# Patient Record
Sex: Female | Born: 1975 | Race: White | Hispanic: No | Smoking: Former smoker
Health system: Southern US, Community
[De-identification: ages and names within clinical notes are randomized; demographics above are authoritative.]

## PROBLEM LIST (undated history)

## (undated) DIAGNOSIS — E559 Vitamin D deficiency, unspecified: Secondary | ICD-10-CM

## (undated) DIAGNOSIS — T4145XA Adverse effect of unspecified anesthetic, initial encounter: Secondary | ICD-10-CM

## (undated) DIAGNOSIS — G501 Atypical facial pain: Secondary | ICD-10-CM

## (undated) DIAGNOSIS — IMO0002 Reserved for concepts with insufficient information to code with codable children: Secondary | ICD-10-CM

## (undated) DIAGNOSIS — G43009 Migraine without aura, not intractable, without status migrainosus: Secondary | ICD-10-CM

## (undated) DIAGNOSIS — Z720 Tobacco use: Secondary | ICD-10-CM

## (undated) DIAGNOSIS — J039 Acute tonsillitis, unspecified: Secondary | ICD-10-CM

## (undated) DIAGNOSIS — F32A Depression, unspecified: Secondary | ICD-10-CM

## (undated) DIAGNOSIS — M329 Systemic lupus erythematosus, unspecified: Secondary | ICD-10-CM

## (undated) DIAGNOSIS — F331 Major depressive disorder, recurrent, moderate: Secondary | ICD-10-CM

## (undated) DIAGNOSIS — Z8719 Personal history of other diseases of the digestive system: Secondary | ICD-10-CM

## (undated) DIAGNOSIS — G44209 Tension-type headache, unspecified, not intractable: Secondary | ICD-10-CM

## (undated) DIAGNOSIS — M5481 Occipital neuralgia: Secondary | ICD-10-CM

## (undated) DIAGNOSIS — B029 Zoster without complications: Secondary | ICD-10-CM

## (undated) DIAGNOSIS — M0520 Rheumatoid vasculitis with rheumatoid arthritis of unspecified site: Secondary | ICD-10-CM

## (undated) DIAGNOSIS — Z8489 Family history of other specified conditions: Secondary | ICD-10-CM

## (undated) DIAGNOSIS — K219 Gastro-esophageal reflux disease without esophagitis: Secondary | ICD-10-CM

## (undated) DIAGNOSIS — S92909A Unspecified fracture of unspecified foot, initial encounter for closed fracture: Secondary | ICD-10-CM

## (undated) DIAGNOSIS — F329 Major depressive disorder, single episode, unspecified: Secondary | ICD-10-CM

## (undated) DIAGNOSIS — T8859XA Other complications of anesthesia, initial encounter: Secondary | ICD-10-CM

## (undated) DIAGNOSIS — F419 Anxiety disorder, unspecified: Secondary | ICD-10-CM

## (undated) DIAGNOSIS — B977 Papillomavirus as the cause of diseases classified elsewhere: Secondary | ICD-10-CM

## (undated) DIAGNOSIS — J329 Chronic sinusitis, unspecified: Secondary | ICD-10-CM

## (undated) DIAGNOSIS — R87619 Unspecified abnormal cytological findings in specimens from cervix uteri: Secondary | ICD-10-CM

## (undated) HISTORY — DX: Depression, unspecified: F32.A

## (undated) HISTORY — PX: CHOLECYSTECTOMY: SHX55

## (undated) HISTORY — DX: Papillomavirus as the cause of diseases classified elsewhere: B97.7

## (undated) HISTORY — PX: TUBAL LIGATION: SHX77

## (undated) HISTORY — PX: TONSILLECTOMY: SUR1361

## (undated) HISTORY — DX: Vitamin D deficiency, unspecified: E55.9

## (undated) HISTORY — DX: Unspecified abnormal cytological findings in specimens from cervix uteri: R87.619

## (undated) HISTORY — DX: Major depressive disorder, recurrent, moderate: F33.1

## (undated) HISTORY — PX: WISDOM TOOTH EXTRACTION: SHX21

## (undated) HISTORY — DX: Migraine without aura, not intractable, without status migrainosus: G43.009

## (undated) HISTORY — DX: Gastro-esophageal reflux disease without esophagitis: K21.9

## (undated) HISTORY — DX: Tobacco use: Z72.0

## (undated) HISTORY — DX: Rheumatoid vasculitis with rheumatoid arthritis of unspecified site: M05.20

## (undated) HISTORY — DX: Major depressive disorder, single episode, unspecified: F32.9

## (undated) SURGERY — Surgical Case
Anesthesia: *Unknown

---
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
## 2005-01-18 ENCOUNTER — Emergency Department: Payer: Self-pay | Admitting: Emergency Medicine

## 2007-08-18 ENCOUNTER — Emergency Department: Payer: Self-pay | Admitting: Emergency Medicine

## 2008-03-05 ENCOUNTER — Other Ambulatory Visit: Payer: Self-pay

## 2008-03-05 ENCOUNTER — Emergency Department: Payer: Self-pay | Admitting: Emergency Medicine

## 2010-01-02 ENCOUNTER — Emergency Department: Payer: Self-pay | Admitting: Emergency Medicine

## 2010-07-24 ENCOUNTER — Emergency Department: Payer: Self-pay | Admitting: Emergency Medicine

## 2012-02-02 ENCOUNTER — Ambulatory Visit: Payer: Self-pay | Admitting: Otolaryngology

## 2013-08-19 DIAGNOSIS — R87619 Unspecified abnormal cytological findings in specimens from cervix uteri: Secondary | ICD-10-CM

## 2013-08-19 HISTORY — DX: Unspecified abnormal cytological findings in specimens from cervix uteri: R87.619

## 2013-09-01 LAB — HM PAP SMEAR

## 2013-10-05 HISTORY — PX: LEEP: SHX91

## 2014-08-29 ENCOUNTER — Ambulatory Visit: Payer: Self-pay | Admitting: Neurology

## 2014-11-19 ENCOUNTER — Emergency Department: Payer: Self-pay | Admitting: Emergency Medicine

## 2014-11-19 LAB — CBC WITH DIFFERENTIAL/PLATELET
BASOS PCT: 1.2 %
Basophil #: 0.1 10*3/uL (ref 0.0–0.1)
EOS ABS: 0.2 10*3/uL (ref 0.0–0.7)
Eosinophil %: 1.7 %
HCT: 40.5 % (ref 35.0–47.0)
HGB: 13.5 g/dL (ref 12.0–16.0)
LYMPHS PCT: 27 %
Lymphocyte #: 2.6 10*3/uL (ref 1.0–3.6)
MCH: 31.2 pg (ref 26.0–34.0)
MCHC: 33.2 g/dL (ref 32.0–36.0)
MCV: 94 fL (ref 80–100)
Monocyte #: 0.6 x10 3/mm (ref 0.2–0.9)
Monocyte %: 6.1 %
Neutrophil #: 6.1 10*3/uL (ref 1.4–6.5)
Neutrophil %: 64 %
Platelet: 347 10*3/uL (ref 150–440)
RBC: 4.32 10*6/uL (ref 3.80–5.20)
RDW: 12.7 % (ref 11.5–14.5)
WBC: 9.5 10*3/uL (ref 3.6–11.0)

## 2014-11-19 LAB — COMPREHENSIVE METABOLIC PANEL
ANION GAP: 7 (ref 7–16)
Albumin: 4 g/dL (ref 3.4–5.0)
Alkaline Phosphatase: 67 U/L (ref 46–116)
BILIRUBIN TOTAL: 0.3 mg/dL (ref 0.2–1.0)
BUN: 9 mg/dL (ref 7–18)
CHLORIDE: 105 mmol/L (ref 98–107)
CO2: 27 mmol/L (ref 21–32)
CREATININE: 0.7 mg/dL (ref 0.60–1.30)
Calcium, Total: 9.1 mg/dL (ref 8.5–10.1)
EGFR (Non-African Amer.): >60
Glucose: 83 mg/dL (ref 65–99)
OSMOLALITY: 275 (ref 275–301)
POTASSIUM: 3.7 mmol/L (ref 3.5–5.1)
SGOT(AST): 25 U/L (ref 15–37)
SGPT (ALT): 20 U/L (ref 14–63)
SODIUM: 139 mmol/L (ref 136–145)
TOTAL PROTEIN: 7.6 g/dL (ref 6.4–8.2)

## 2014-11-19 LAB — URINALYSIS, COMPLETE
Bilirubin,UR: NEGATIVE
GLUCOSE, UR: NEGATIVE mg/dL (ref 0–75)
Ketone: NEGATIVE
Nitrite: NEGATIVE
Ph: 6 (ref 4.5–8.0)
Protein: NEGATIVE
Specific Gravity: 1.008 (ref 1.003–1.030)
Squamous Epithelial: 5
WBC UR: 3 /HPF (ref 0–5)

## 2014-11-19 LAB — LIPASE, BLOOD: Lipase: 112 U/L (ref 73–393)

## 2014-11-19 LAB — TROPONIN I: Troponin-I: <0.02 ng/mL

## 2014-11-21 ENCOUNTER — Ambulatory Visit: Payer: Self-pay | Admitting: Surgery

## 2014-11-22 ENCOUNTER — Ambulatory Visit: Payer: Self-pay | Admitting: Surgery

## 2014-11-30 ENCOUNTER — Ambulatory Visit: Payer: Self-pay | Admitting: Surgery

## 2014-12-06 ENCOUNTER — Ambulatory Visit: Payer: Self-pay | Admitting: Surgery

## 2014-12-07 ENCOUNTER — Ambulatory Visit: Payer: Self-pay | Admitting: Surgery

## 2014-12-27 ENCOUNTER — Ambulatory Visit: Payer: Self-pay | Admitting: Gastroenterology

## 2014-12-28 ENCOUNTER — Ambulatory Visit: Payer: Self-pay | Admitting: Gastroenterology

## 2015-01-19 ENCOUNTER — Ambulatory Visit
Admit: 2015-01-19 | Disposition: A | Discharge status: Home / Self Care | Payer: Self-pay | Attending: Family Medicine | Admitting: Family Medicine

## 2015-02-11 LAB — SURGICAL PATHOLOGY

## 2015-02-17 NOTE — H&P (Signed)
PATIENT NAME:  Erika Williamson, Erika Williamson MR#:  [id number] DATE OF BIRTH:  [birth year]  DATE OF ADMISSION:  11/21/2014  DATE OF OPERATION: 11/22/2014   CHIEF COMPLAINT: Right upper quadrant pain.   HISTORY OF PRESENT ILLNESS: This is a patient seen in the office on February 3rd with approximately 2-1/2 weeks of abdominal pain that  has been in the epigastrium and right upper quadrant. It was initially associated with fatty food intolerance, but has been nearly unrelenting. She has been unable to eat. She was in the Emergency Room the other day, where her liver function tests were normal and an ultrasound shows stones. She is here for elective laparoscopic cholecystectomy, but she likely has a diagnosis of acute cholecystitis due to her unrelenting pain and physical exam findings.   PAST MEDICAL HISTORY: None.   PAST SURGICAL HISTORY: C-section x 2 and wisdom teeth.   ALLERGIES: None.   MEDICATIONS: None.   FAMILY HISTORY: No history of gallbladder disease in her family.   SOCIAL HISTORY: The patient smokes tobacco, about 1 pack of cigarettes per day. Does not drink much alcohol. Works as a Freight forwarder at [employer].   REVIEW OF SYSTEMS: A complete system review is performed and documented in the office chart. It is normal or negative with the exception of that mentioned in the HPI.   PHYSICAL EXAMINATION: GENERAL: Uncomfortable -appearing female patient.  VITAL SIGNS: She is afebrile and vital signs are stable.  HEENT: No scleral icterus.  NECK: No palpable neck nodes.  CHEST: Clear to auscultation.  CARDIAC: Regular rate and rhythm.  ABDOMEN: Soft. There is considerable tenderness in the right upper quadrant with a positive Murphy sign. A C-section Pfannenstiel-type scar is noted. There may be a very small umbilical hernia, but it is nontender and nonerythematous.  EXTREMITIES: Without edema. Calves are nontender.  NEUROLOGIC: Grossly intact.  INTEGUMENT: No jaundice.   LABORATORY VALUES:  Done from the Emergency Room demonstrate no sign of choledocholithiasis. An ultrasound shows stones.   ASSESSMENT AND PLAN: This is a patient with biliary colic at the very least, but more than likely, she has clinical findings of acute cholecystitis. I have recommended urgent laparoscopic cholecystectomy for control of her symptoms. I offered her surgery on February 4th. She wanted to put it off for another week, but she is so uncomfortable, I urged her to have surgery sooner, as she has been unable to eat. The rationale for this has been discussed. The offer to do this at any time was discussed with her, and she has chosen to do it on February 4th. The risks of bleeding, infection, recurrence of symptoms, failure to resolve her symptoms, open procedure, bile duct damage, bile duct leak, bowel injury, any of which could require further surgery and/or ERCP, stent, and papillotomy were all discussed with her. Her son was present.    ____________________________ Jerrol Banana. Burt Knack, MD rec:mw D: 11/21/2014 14:11:00 ET T: 11/21/2014 14:52:28 ET JOB#: [id number]  cc: Jerrol Banana. Burt Knack, MD, <Dictator> Florene Glen MD ELECTRONICALLY SIGNED 11/22/2014 8:47

## 2015-02-17 NOTE — Op Note (Signed)
PATIENT NAME:  Erika Williamson, HAKEEM MR#:  [id number] DATE OF BIRTH:  06-Jul-1976  DATE OF PROCEDURE:  11/22/2014  PREOPERATIVE DIAGNOSIS: Acute cholecystitis.   POSTOPERATIVE DIAGNOSIS: Acute cholecystitis.   PROCEDURE: Laparoscopic cholecystectomy.   SURGEON: Phoebe Perch, MD.   ANESTHESIA: General with endotracheal tube.   INDICATIONS: This is a patient seen in the office yesterday with unrelenting right upper quadrant pain. She had been in the ER earlier this week. Preoperatively we discussed rationale for surgery, the options of observation, risks of bleeding, infection, recurrence of symptoms, failure to resolve her symptoms, open procedure, bile duct damage, bile duct leak, retained common bile duct stone, any of which could require further surgery and/or ERCP, stent, papillotomy. This was all reviewed for her in the preoperative holding area. She understood and agreed to proceed.   FINDINGS: Minimal signs of acute cholecystitis, some edema of the gallbladder, stones were present.   DESCRIPTION OF PROCEDURE: The patient was induced to general anesthesia. She was given IV antibiotics. VTE prophylaxis was in place. She was prepped and draped in a sterile fashion. Marcaine was infiltrated in skin and subcutaneous tissues around the supraumbilical area. Incision was made. Veress needle was placed. Pneumoperitoneum was obtained. A 5 mm trocar port was placed. The abdominal cavity was explored and under direct vision a 10 mm epigastric port and 2 lateral 5 mm ports were placed. The gallbladder was placed on tension. Adhesions were taken down bluntly without the use of energy. The peritoneum over the infundibulum was incised bluntly. The cystic duct-gallbladder junction was well identified as was the cystic artery. The cystic duct was then doubly clipped and divided and the cystic artery was doubly clipped and divided in 2 branches. The gallbladder was taken from the gallbladder fossa with  electrocautery and passed out through the epigastric port site with the aid of an EndoCatch bag. The area was checked for hemostasis, found to be adequate. There was no sign of bleeding, bile leak, or bowel injury. The camera was placed in the epigastric site to view back to the periumbilical site. There was no sign of bowel injury or adhesions. Therefore pneumoperitoneum was released. All ports were removed. Fascial edges at the epigastric site were approximated with figure-of-eight 0 Vicryls, 4-0 subcuticular Monocryl was used on all skin edges. Steri-Strips, Mastisol, and sterile dressings were placed.   The patient tolerated the procedure well. There were no complications. She was taken to the recovery room in stable condition to be discharged to the care of her family. Follow up in 10 days.    ____________________________ Jerrol Banana Burt Knack, MD rec:bu D: 11/22/2014 09:21:43 ET T: 11/22/2014 15:44:39 ET JOB#: [id number]  cc: Jerrol Banana. Burt Knack, MD, <Dictator> Florene Glen MD ELECTRONICALLY SIGNED 11/26/2014 23:33

## 2015-03-27 ENCOUNTER — Telehealth: Payer: Self-pay | Admitting: Family Medicine

## 2015-03-27 NOTE — Telephone Encounter (Signed)
Pt calling about appt Dr Sanda Klein wanted set up for positive celiac test.  Please call her.

## 2015-03-27 NOTE — Telephone Encounter (Signed)
Pt given appointment time to GI.

## 2015-05-02 ENCOUNTER — Encounter: Payer: Self-pay | Admitting: *Deleted

## 2015-05-03 ENCOUNTER — Encounter: Payer: Self-pay | Admitting: Anesthesiology

## 2015-05-03 ENCOUNTER — Ambulatory Visit: Payer: BLUE CROSS/BLUE SHIELD | Admitting: Anesthesiology

## 2015-05-03 ENCOUNTER — Encounter
Admission: RE | Disposition: A | Discharge status: Home / Self Care | Payer: Self-pay | Source: Ambulatory Visit | Attending: Gastroenterology

## 2015-05-03 ENCOUNTER — Ambulatory Visit
Admission: RE | Admit: 2015-05-03 | Discharge: 2015-05-03 | Disposition: A | Discharge status: Home / Self Care | Payer: BLUE CROSS/BLUE SHIELD | Source: Ambulatory Visit | Attending: Gastroenterology | Admitting: Gastroenterology

## 2015-05-03 DIAGNOSIS — Z79899 Other long term (current) drug therapy: Secondary | ICD-10-CM | POA: Insufficient documentation

## 2015-05-03 DIAGNOSIS — K3189 Other diseases of stomach and duodenum: Secondary | ICD-10-CM | POA: Diagnosis not present

## 2015-05-03 DIAGNOSIS — Z1211 Encounter for screening for malignant neoplasm of colon: Secondary | ICD-10-CM | POA: Diagnosis not present

## 2015-05-03 DIAGNOSIS — K640 First degree hemorrhoids: Secondary | ICD-10-CM | POA: Insufficient documentation

## 2015-05-03 DIAGNOSIS — F17210 Nicotine dependence, cigarettes, uncomplicated: Secondary | ICD-10-CM | POA: Insufficient documentation

## 2015-05-03 DIAGNOSIS — Z9049 Acquired absence of other specified parts of digestive tract: Secondary | ICD-10-CM | POA: Insufficient documentation

## 2015-05-03 DIAGNOSIS — Z800 Family history of malignant neoplasm of digestive organs: Secondary | ICD-10-CM | POA: Insufficient documentation

## 2015-05-03 DIAGNOSIS — K635 Polyp of colon: Secondary | ICD-10-CM | POA: Insufficient documentation

## 2015-05-03 DIAGNOSIS — Z808 Family history of malignant neoplasm of other organs or systems: Secondary | ICD-10-CM | POA: Diagnosis present

## 2015-05-03 DIAGNOSIS — F419 Anxiety disorder, unspecified: Secondary | ICD-10-CM | POA: Insufficient documentation

## 2015-05-03 DIAGNOSIS — J449 Chronic obstructive pulmonary disease, unspecified: Secondary | ICD-10-CM | POA: Insufficient documentation

## 2015-05-03 DIAGNOSIS — K5730 Diverticulosis of large intestine without perforation or abscess without bleeding: Secondary | ICD-10-CM | POA: Diagnosis not present

## 2015-05-03 DIAGNOSIS — Z9851 Tubal ligation status: Secondary | ICD-10-CM | POA: Insufficient documentation

## 2015-05-03 DIAGNOSIS — R894 Abnormal immunological findings in specimens from other organs, systems and tissues: Secondary | ICD-10-CM | POA: Diagnosis present

## 2015-05-03 HISTORY — DX: Acute tonsillitis, unspecified: J03.90

## 2015-05-03 HISTORY — DX: Chronic sinusitis, unspecified: J32.9

## 2015-05-03 HISTORY — DX: Anxiety disorder, unspecified: F41.9

## 2015-05-03 HISTORY — DX: Atypical facial pain: G50.1

## 2015-05-03 HISTORY — PX: COLONOSCOPY WITH PROPOFOL: SHX5780

## 2015-05-03 HISTORY — DX: Tension-type headache, unspecified, not intractable: G44.209

## 2015-05-03 HISTORY — PX: ESOPHAGOGASTRODUODENOSCOPY: SHX5428

## 2015-05-03 HISTORY — DX: Zoster without complications: B02.9

## 2015-05-03 HISTORY — DX: Unspecified fracture of unspecified foot, initial encounter for closed fracture: S92.909A

## 2015-05-03 LAB — POCT PREGNANCY, URINE: Preg Test, Ur: NEGATIVE

## 2015-05-03 SURGERY — COLONOSCOPY WITH PROPOFOL
Anesthesia: General

## 2015-05-03 MED ORDER — PROPOFOL INFUSION 10 MG/ML OPTIME
INTRAVENOUS | Status: DC | PRN
  Administered 2015-05-03: 160 ug/kg/min via INTRAVENOUS

## 2015-05-03 MED ORDER — MIDAZOLAM HCL 2 MG/2ML IJ SOLN
INTRAMUSCULAR | Status: DC | PRN
Start: 2015-05-03 — End: 2015-05-03
  Administered 2015-05-03: 2 mg via INTRAVENOUS

## 2015-05-03 MED ORDER — SODIUM CHLORIDE 0.9 % IV SOLN: INTRAVENOUS | Status: DC

## 2015-05-03 MED ORDER — SODIUM CHLORIDE 0.9 % IV SOLN
INTRAVENOUS | Status: DC
  Administered 2015-05-03: 1000 mL via INTRAVENOUS

## 2015-05-03 MED ORDER — LIDOCAINE HCL (CARDIAC) 20 MG/ML IV SOLN
INTRAVENOUS | Status: DC | PRN
  Administered 2015-05-03: 80 mg via INTRAVENOUS

## 2015-05-03 NOTE — Op Note (Signed)
[facility] Gastroenterology Patient Name: Erika Williamson Procedure Date: 05/03/2015 10:39 AM MRN: [id number] Account #: [id number] Date of Birth: 09-27-1976 Admit Type: Outpatient Age: 39 Room: [facility] ENDO ROOM 3 Gender: Female Note Status: Finalized Procedure:         Colonoscopy Indications:       Colon cancer screening in patient at increased risk:                     Family history of colon polyps, Screening in patient at                     increased risk: Family history of 1st-degree relative with                     colorectal cancer before age 75 years, This is the                     patient's first colonoscopy Patient Profile:   This is a 39 year old female. Providers:         Gerrit Heck. Rayann Heman, MD Referring MD:      Arnetha Courser (Referring MD) Medicines:         Propofol per Anesthesia Complications:     No immediate complications. Procedure:         Pre-Anesthesia Assessment:                    - Prior to the procedure, a History and Physical was                     performed, and patient medications, allergies and                     sensitivities were reviewed. The patient's tolerance of                     previous anesthesia was reviewed.                    After obtaining informed consent, the colonoscope was                     passed under direct vision. Throughout the procedure, the                     patient's blood pressure, pulse, and oxygen saturations                     were monitored continuously. The Colonoscope was                     introduced through the anus and advanced to the the cecum,                     identified by appendiceal orifice and ileocecal valve. The                     colonoscopy was performed without difficulty. The patient                     tolerated the procedure well. The quality of the bowel                     preparation was excellent. Findings:      The perianal and digital rectal  examinations  were normal.      Five sessile polyps were found in the recto-sigmoid colon and in the       sigmoid colon. The polyps were 1 to 2 mm in size. These polyps were       removed with a jumbo cold forceps. Resection and retrieval were complete.      Internal hemorrhoids were found during retroflexion. The hemorrhoids       were Grade I (internal hemorrhoids that do not prolapse).      A few small-mouthed diverticula were found in the sigmoid colon. Impression:        - Five 1 to 2 mm polyps at the recto-sigmoid colon and in                     the sigmoid colon. Resected and retrieved.                    - Internal hemorrhoids.                    - Diverticulosis in the sigmoid colon. Recommendation:    - Observe patient in GI recovery unit.                    - Resume regular diet.                    - Continue present medications.                    - Await pathology results.                    - Repeat colonoscopy in 5 years for screening/suveillance                     purposes based on pathology.                    - Return to referring physician.                    - The findings and recommendations were discussed with the                     patient.                    - The findings and recommendations were discussed with the                     patient's family. Procedure Code(s): --- Professional ---                    [id number], Colonoscopy, flexible; with biopsy, single or                     multiple CPT copyright 2014 American Medical Association. All rights reserved. The codes documented in this report are preliminary and upon coder review may  be revised to meet current compliance requirements. Mellody Life, MD 05/03/2015 11:05:29 AM This report has been signed electronically. Number of Addenda: 0 Note Initiated On: 05/03/2015 10:39 AM Scope Withdrawal Time: 0 hours 15 minutes 31 seconds  Total Procedure Duration: 0 hours 18 minutes 54 seconds       [facility]

## 2015-05-03 NOTE — Discharge Instructions (Signed)

## 2015-05-03 NOTE — H&P (Signed)
  Primary Care Physician:  Enid Derry, MD  Pre-Procedure History & Physical: HPI:  Erika Williamson is a 39 y.o. female is here for an colonoscopy / EGD   Past Medical History  Diagnosis Date  . Tonsillitis   . Anxiety   . Shingles   . Atypical face pain     Left Sided  . Sinusitis   . Tension headache   . Foot fracture Left X2  . COPD (chronic obstructive pulmonary disease)     Past Surgical History  Procedure Laterality Date  . Cholecystectomy    . Tubal ligation    . Tonsillectomy    . Cesarean section      X2  . Wisdom tooth extraction      Prior to Admission medications   Medication Sig Start Date End Date Taking? Authorizing Provider  calcium-vitamin D (OSCAL WITH D) 500-200 MG-UNIT per tablet Take 2 tablets by mouth.   Yes Historical Provider, MD  diphenhydrAMINE (SOMINEX) 25 MG tablet Take 25 mg by mouth as directed.   Yes Historical Provider, MD  esomeprazole (NEXIUM) 20 MG capsule Take 20 mg by mouth daily at 12 noon.   Yes Historical Provider, MD  levonorgestrel (MIRENA) 20 MCG/24HR IUD 1 each by Intrauterine route once.   Yes Historical Provider, MD  lidocaine-prilocaine (EMLA) cream Apply 1 application topically as needed.   Yes Historical Provider, MD  rizatriptan (MAXALT-MLT) 10 MG disintegrating tablet Take 10 mg by mouth as needed for migraine. May repeat in 2 hours if needed   Yes Historical Provider, MD  sucralfate (CARAFATE) 1 G tablet Take 1 g by mouth 4 (four) times daily -  with meals and at bedtime.   Yes Historical Provider, MD    Allergies as of 04/15/2015  . (Not on File)    History reviewed. No pertinent family history.  History   Social History  . Marital Status: Married    Spouse Name: N/A  . Number of Children: N/A  . Years of Education: N/A   Occupational History  . Not on file.   Social History Main Topics  . Smoking status: Current Every Day Smoker -- 1.00 packs/day    Types: Cigarettes  . Smokeless tobacco: Not on  file  . Alcohol Use: Not on file  . Drug Use: Not on file  . Sexual Activity: Not on file   Other Topics Concern  . Not on file   Social History Narrative     Physical Exam: BP 115/88 mmHg  Pulse 102  Temp(Src) 98.3 F (36.8 C) (Tympanic)  Resp 24  Ht 5\' 3"  (1.6 m)  Wt 72.576 kg (160 lb)  BMI 28.35 kg/m2  SpO2 98% General:   Alert,  pleasant and cooperative in NAD Head:  Normocephalic and atraumatic. Neck:  Supple; no masses or thyromegaly. Lungs:  Clear throughout to auscultation.    Heart:  Regular rate and rhythm. Abdomen:  Soft, nontender and nondistended. Normal bowel sounds, without guarding, and without rebound.   Neurologic:  Alert and  oriented x4;  grossly normal neurologically.  Impression/Plan: Erika Williamson is here for an colonoscopy to be performed for high risk screening, EGD for positive celiac serologies.  Risks, benefits, limitations, and alternatives regarding  Colonoscopy / EGD have been reviewed with the patient.  Questions have been answered.  All parties agreeable.   Josefine Class, MD  05/03/2015, 10:26 AM

## 2015-05-03 NOTE — Anesthesia Preprocedure Evaluation (Signed)
Anesthesia Evaluation  Patient identified by MRN, date of birth, ID band Patient awake    Reviewed: Allergy & Precautions, NPO status , Patient's Chart, lab work & pertinent test results, reviewed documented beta blocker date and time   Airway Mallampati: II  TM Distance: >3 FB     Dental  (+) Chipped   Pulmonary          Cardiovascular     Neuro/Psych  Headaches, Anxiety    GI/Hepatic   Endo/Other    Renal/GU      Musculoskeletal   Abdominal   Peds  Hematology   Anesthesia Other Findings   Reproductive/Obstetrics                             Anesthesia Physical Anesthesia Plan  ASA: III  Anesthesia Plan: General   Post-op Pain Management:    Induction: Intravenous  Airway Management Planned: Nasal Cannula  Additional Equipment:   Intra-op Plan:   Post-operative Plan:   Informed Consent: I have reviewed the patients History and Physical, chart, labs and discussed the procedure including the risks, benefits and alternatives for the proposed anesthesia with the patient or authorized representative who has indicated his/her understanding and acceptance.     Plan Discussed with: CRNA  Anesthesia Plan Comments:         Anesthesia Quick Evaluation

## 2015-05-03 NOTE — Transfer of Care (Signed)
Immediate Anesthesia Transfer of Care Note  Patient: Erika Williamson  Procedure(s) Performed: Procedure(s): COLONOSCOPY WITH PROPOFOL (N/A) ESOPHAGOGASTRODUODENOSCOPY (EGD) (N/A)  Patient Location: PACU and Endoscopy Unit  Anesthesia Type:General  Level of Consciousness: sedated  Airway & Oxygen Therapy: Patient Spontanous Breathing and Patient connected to nasal cannula oxygen  Post-op Assessment: Report given to RN  Post vital signs: Reviewed and stable  Last Vitals:  Filed Vitals:   05/03/15 1104  BP: 105/74  Pulse: 94  Temp: 36 C  Resp: 16    Complications: No apparent anesthesia complications

## 2015-05-03 NOTE — Op Note (Signed)
[facility] Gastroenterology Patient Name: Erika Williamson Procedure Date: 05/03/2015 10:25 AM MRN: [id number] Account #: [id number] Date of Birth: 22-Mar-1976 Admit Type: Outpatient Age: 38 Room: [facility] ENDO ROOM 3 Gender: Female Note Status: Finalized Procedure:         Upper GI endoscopy Indications:       Positive celiac serologies Patient Profile:   This is a 39 year old female. Providers:         Gerrit Heck. Rayann Heman, MD Referring MD:      Arnetha Courser (Referring MD) Medicines:         Propofol per Anesthesia Complications:     No immediate complications. Procedure:         Pre-Anesthesia Assessment:                    - Prior to the procedure, a History and Physical was                     performed, and patient medications, allergies and                     sensitivities were reviewed. The patient's tolerance of                     previous anesthesia was reviewed.                    After obtaining informed consent, the endoscope was passed                     under direct vision. Throughout the procedure, the                     patient's blood pressure, pulse, and oxygen saturations                     were monitored continuously. The Endoscope was introduced                     through the mouth, and advanced to the second part of                     duodenum. The upper GI endoscopy was accomplished without                     difficulty. The patient tolerated the procedure well. The                     upper GI endoscopy was accomplished without difficulty. Findings:      The esophagus was normal.      The stomach was normal.      The examined duodenum was normal.      Eight biopsies were obtained with cold forceps for histology randomly in       the duodenal bulb and in the 2nd part of the duodenum. Impression:        - Normal esophagus.                    - Normal stomach.                    - Normal examined duodenum.                    - Eight  biopsies were obtained in the duodenal  bulb and in                     the 2nd part of the duodenum. Recommendation:    - Perform a colonoscopy today.                    - Continue present medications.                    - Await pathology results.                    - The findings and recommendations were discussed with the                     patient.                    - The findings and recommendations were discussed with the                     patient's family. Procedure Code(s): --- Professional ---                    [phone], Esophagogastroduodenoscopy, flexible, transoral;                     with biopsy, single or multiple CPT copyright 2014 American Medical Association. All rights reserved. The codes documented in this report are preliminary and upon coder review may  be revised to meet current compliance requirements. Mellody Life, MD 05/03/2015 10:39:02 AM This report has been signed electronically. Number of Addenda: 0 Note Initiated On: 05/03/2015 10:25 AM      [facility]

## 2015-05-03 NOTE — Anesthesia Postprocedure Evaluation (Signed)
  Anesthesia Post-op Note  Patient: Erika Williamson  Procedure(s) Performed: Procedure(s): COLONOSCOPY WITH PROPOFOL (N/A) ESOPHAGOGASTRODUODENOSCOPY (EGD) (N/A)  Anesthesia type:General  Patient location: PACU  Post pain: Pain level controlled  Post assessment: Post-op Vital signs reviewed, Patient's Cardiovascular Status Stable, Respiratory Function Stable, Patent Airway and No signs of Nausea or vomiting  Post vital signs: Reviewed and stable  Last Vitals:  Filed Vitals:   05/03/15 1124  BP: 109/79  Pulse: 84  Temp:   Resp: 21    Level of consciousness: awake, alert  and patient cooperative  Complications: No apparent anesthesia complications

## 2015-05-06 LAB — SURGICAL PATHOLOGY

## 2015-05-13 ENCOUNTER — Encounter: Payer: Self-pay | Admitting: Gastroenterology

## 2015-06-13 ENCOUNTER — Ambulatory Visit: Payer: Self-pay | Admitting: Family Medicine

## 2015-06-17 DIAGNOSIS — K219 Gastro-esophageal reflux disease without esophagitis: Secondary | ICD-10-CM | POA: Insufficient documentation

## 2015-06-17 DIAGNOSIS — R87613 High grade squamous intraepithelial lesion on cytologic smear of cervix (HGSIL): Secondary | ICD-10-CM | POA: Insufficient documentation

## 2015-06-17 DIAGNOSIS — E559 Vitamin D deficiency, unspecified: Secondary | ICD-10-CM | POA: Insufficient documentation

## 2015-06-17 DIAGNOSIS — B977 Papillomavirus as the cause of diseases classified elsewhere: Secondary | ICD-10-CM | POA: Insufficient documentation

## 2015-06-17 DIAGNOSIS — Z720 Tobacco use: Secondary | ICD-10-CM | POA: Insufficient documentation

## 2015-06-19 ENCOUNTER — Encounter: Payer: Self-pay | Admitting: *Deleted

## 2015-06-19 ENCOUNTER — Encounter: Payer: Self-pay | Admitting: Family Medicine

## 2015-06-19 ENCOUNTER — Ambulatory Visit (INDEPENDENT_AMBULATORY_CARE_PROVIDER_SITE_OTHER): Payer: BLUE CROSS/BLUE SHIELD | Admitting: Family Medicine

## 2015-06-19 VITALS — BP 110/71 | HR 88 | Temp 98.0°F | Ht 63.5 in | Wt 161.0 lb

## 2015-06-19 DIAGNOSIS — K900 Celiac disease: Secondary | ICD-10-CM | POA: Diagnosis not present

## 2015-06-19 DIAGNOSIS — R87613 High grade squamous intraepithelial lesion on cytologic smear of cervix (HGSIL): Secondary | ICD-10-CM

## 2015-06-19 DIAGNOSIS — L02234 Carbuncle of groin: Secondary | ICD-10-CM

## 2015-06-19 DIAGNOSIS — K9041 Non-celiac gluten sensitivity: Secondary | ICD-10-CM

## 2015-06-19 DIAGNOSIS — L02224 Furuncle of groin: Secondary | ICD-10-CM | POA: Insufficient documentation

## 2015-06-19 DIAGNOSIS — Z720 Tobacco use: Secondary | ICD-10-CM | POA: Diagnosis not present

## 2015-06-19 MED ORDER — SULFAMETHOXAZOLE-TRIMETHOPRIM 800-160 MG PO TABS: 1.0000 | ORAL_TABLET | Freq: Two times a day (BID) | ORAL | Status: DC

## 2015-06-19 NOTE — Assessment & Plan Note (Signed)
She is cutting back, but not quite ready to quit smoking; I am here to help her quit when ready

## 2015-06-19 NOTE — Patient Instructions (Signed)
Start the new antibiotics Use warm compresses over the area 3-4 times a day for the first several days We'll have you see a general surgeon to see if that area needs to be excised Please do eat yogurt daily or take a probiotic daily for the next month or two We want to replace the healthy germs in the gut If you notice foul, watery diarrhea in the next two months, schedule an appointment RIGHT AWAY Do call if you get worse or develop fevers

## 2015-06-19 NOTE — Assessment & Plan Note (Signed)
Followed by Dr. Marcelline Mates

## 2015-06-19 NOTE — Assessment & Plan Note (Signed)
Start antibiotics; warm compresses; refer to general surgeon for evaluation to see if area needs excision given its chronicity

## 2015-06-19 NOTE — Progress Notes (Signed)
  BP 110/71 mmHg  Pulse 88  Temp(Src) 98 F (36.7 C)  Ht 5' 3.5" (1.613 m)  Wt 161 lb (73.029 kg)  BMI 28.07 kg/m2  SpO2 98%  LMP 06/04/2015 (Exact Date)   Subjective:    Patient ID: Erika Williamson, female    DOB: 1976-01-08, 39 y.o.   MRN: [id number]  HPI: Erika Williamson is a 39 y.o. female  Chief Complaint  Patient presents with  . 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 Rayann Heman; she does not have celiac disease, but does have gluten intolerance; she can tell a big difference with her abdominal symptoms, less abd bloating as long as she avoids gluten  Relevant past medical, surgical, family and social history reviewed and updated as indicated. Interim medical history since our last visit reviewed. Allergies and medications reviewed and updated.  Review of Systems Per HPI unless specifically indicated above     Objective:    BP 110/71 mmHg  Pulse 88  Temp(Src) 98 F (36.7 C)  Ht 5' 3.5" (1.613 m)  Wt 161 lb (73.029 kg)  BMI 28.07 kg/m2  SpO2 98%  LMP 06/04/2015 (Exact Date)  Wt Readings from Last 3 Encounters:  06/19/15 161 lb (73.029 kg)  02/27/15 164 lb (74.39 kg)  05/03/15 160 lb (72.576 kg)    Physical Exam  Constitutional: She appears well-developed and well-nourished.  Cardiovascular: Normal rate and regular rhythm.   Pulmonary/Chest: Effort normal and breath sounds normal.  Skin: Skin is warm.  In the left groin, just distal to the inguinal crease, there is an elongated area of erythema and thickening; no active drainage; the area is tender; about 3  cm in total length, perhaps 1.5 cm in width; nothing appears amenable to simple office I&D at this time  Psychiatric: She has a normal mood and affect. Thought content normal.    Results for orders placed or performed in visit on 06/17/15  HM PAP SMEAR  Result Value Ref Range   HM Pap smear per PP       Assessment & Plan:   Problem List Items Addressed This Visit      Digestive   Gluten intolerance - Primary     Musculoskeletal and Integument   Boil, groin    Start antibiotics; warm compresses; refer to general surgeon for evaluation to see if area needs excision given its chronicity      Relevant Orders   Ambulatory referral to General Surgery     Other   Tobacco abuse    She is cutting back, but not quite ready to quit smoking; I am here to help her quit when ready      High grade squamous intraepithelial cervical dysplasia    Followed by Dr. Marcelline Mates         Follow up plan: No Follow-up on file.

## 2015-06-25 ENCOUNTER — Ambulatory Visit (INDEPENDENT_AMBULATORY_CARE_PROVIDER_SITE_OTHER): Payer: BLUE CROSS/BLUE SHIELD | Admitting: General Surgery

## 2015-06-25 ENCOUNTER — Encounter: Payer: Self-pay | Admitting: General Surgery

## 2015-06-25 VITALS — BP 106/62 | HR 80 | Temp 99.5°F | Resp 14 | Ht 63.5 in | Wt 163.0 lb

## 2015-06-25 DIAGNOSIS — L02214 Cutaneous abscess of groin: Secondary | ICD-10-CM

## 2015-06-25 NOTE — Progress Notes (Signed)
Patient ID: Erika Williamson, female   DOB: Jul 21, 1976, 39 y.o.   MRN: [id number]  Chief Complaint  Patient presents with  . Other    chronic boil in groin    HPI Erika Williamson is a 39 y.o. female.  Here today for evaluation of a chronic boil in her left groin. She states it has been there for 6 weeks after her colonoscopy. She states it is painful and draining. Prior to seeing Dr Sanda Klein she was using neosporin and band aids. After seeing Dr Sanda Klein she has been using warm compresses and taking Bactrim and it is not any better.   HPI  Past Medical History  Diagnosis Date  . Tonsillitis   . Anxiety   . Shingles   . Atypical face pain     Left Sided  . Sinusitis   . Tension headache   . Foot fracture Left X2  . COPD (chronic obstructive pulmonary disease)   . Tobacco abuse   . Vitamin D deficiency disease   . HPV (human papilloma virus) infection   . High grade squamous intraepithelial cervical dysplasia   . GERD (gastroesophageal reflux disease)     Past Surgical History  Procedure Laterality Date  . Tubal ligation    . Tonsillectomy    . Wisdom tooth extraction    . Colonoscopy with propofol N/A 05/03/2015    Procedure: COLONOSCOPY WITH PROPOFOL;  Surgeon: Josefine Class, MD;  Location: [facility] ENDOSCOPY;  Service: Endoscopy;  Laterality: N/A;  . Esophagogastroduodenoscopy N/A 05/03/2015    Procedure: ESOPHAGOGASTRODUODENOSCOPY (EGD);  Surgeon: Josefine Class, MD;  Location: [facility] ENDOSCOPY;  Service: Endoscopy;  Laterality: N/A;  . Cesarean section      X2  . Cholecystectomy      Dr Burt Knack    Family History  Problem Relation Age of Onset  . Diabetes Mother   . Hypertension Mother   . COPD Mother   . Stroke Mother   . Osteoporosis Mother   . Cancer Father 58    colorectal, spread to lung  . Alcohol abuse Father   . Hypertension Father   . Diabetes Sister   . COPD Sister   . Diabetes Brother     Social History Social History  Substance Use  Topics  . Smoking status: Current Every Day Smoker -- 1.00 packs/day for 22 years    Types: Cigarettes  . Smokeless tobacco: Never Used  . Alcohol Use: No    Allergies  Allergen Reactions  . Ivp Dye [Iodinated Diagnostic Agents]   . Compazine [Prochlorperazine Edisylate]   . Erythromycin   . Levaquin [Levofloxacin In D5w]   . Naprosyn [Naproxen]   . Nsaids   . Pristiq [Desvenlafaxine]   . Ultram [Tramadol Hcl]     Current Outpatient Prescriptions  Medication Sig Dispense Refill  . Cholecalciferol (VITAMIN D-1000 MAX ST) 1000 UNITS tablet Take 1,000 Units by mouth daily.     Marland Kitchen esomeprazole (NEXIUM) 20 MG capsule Take 20 mg by mouth daily at 12 noon.    Marland Kitchen levonorgestrel (MIRENA) 20 MCG/24HR IUD 1 each by Intrauterine route once.    . Lidocaine, Anorectal, 5 % CREA Apply topically.    . lidocaine-prilocaine (EMLA) cream Apply 1 application topically as needed.    . Multiple Vitamin (MULTIVITAMIN) capsule Take 1 capsule by mouth daily.    . rizatriptan (MAXALT-MLT) 10 MG disintegrating tablet Take 10 mg by mouth as needed for migraine. May repeat in 2 hours if needed    .  sucralfate (CARAFATE) 1 G tablet Take 1 g by mouth 4 (four) times daily -  with meals and at bedtime.    . sulfamethoxazole-trimethoprim (BACTRIM DS,SEPTRA DS) 800-160 MG per tablet Take 1 tablet by mouth 2 (two) times daily. 20 tablet 0   No current facility-administered medications for this visit.    Review of Systems Review of Systems  Constitutional: Negative.   Respiratory: Negative.   Cardiovascular: Negative.     Blood pressure 106/62, pulse 80, temperature 99.5 F (37.5 C), temperature source Oral, resp. rate 14, height 5' 3.5" (1.613 m), weight 163 lb (73.936 kg), last menstrual period 06/04/2015.  Physical Exam Physical Exam  Constitutional: She is oriented to person, place, and time. She appears well-developed and well-nourished.  HENT:  Mouth/Throat: Oropharynx is clear and moist.  Eyes:  Conjunctivae are normal. No scleral icterus.  Neck: Neck supple.  Cardiovascular: Normal rate, regular rhythm and normal heart sounds.   Pulmonary/Chest: Effort normal and breath sounds normal.  Abdominal: Soft. Normal appearance.  Genitourinary:  1x3 cm abscess left groin below the inguinal ligament. Soft, fluctuant. Minimal surrounding erythema.  Lymphadenopathy:    She has no cervical adenopathy.       Right: No inguinal adenopathy present.  Neurological: She is alert and oriented to person, place, and time.  Skin: Skin is warm and dry.  Psychiatric: Her behavior is normal.    Data Reviewed PCP notes.  Assessment    Left groin abscess, likely secondary to infected sweat gland.    Plan    Procedure for incision and drainage to accelerate healing was reviewed. 10 mL of 0.5% Xylocaine with 0.25% Marcaine with 1-200,000 Anzemet epinephrine was utilized well tolerated. This was supplemented with 3 mL of 1% plain Xylocaine. Chlor prep was applied to the area. An incision was made over the central fluctuant area with essentially no drainage. Thickened tissue consistent with inflamed adipose tissue was excised, measuring about 1 x 1 x 1.5 cm. This tissue was sent for pathologic review. This extended down to the superficial fascia. At this level the area appeared normal. The wound was loosely approximated with 2, 4-0 nylon sutures. Dry dressing with Telfa and Tegaderm applied. No bleeding noted. The procedure was well tolerated. Ice applied for comfort. A prescription for narcotics was declined.  The patient was instructed in regards postoperative wound care.    Follow up one week. Pathology pending.   PCP:  Fleet Contras 06/26/2015, 7:16 AM

## 2015-06-25 NOTE — Patient Instructions (Signed)
The patient is aware to call back for any questions or concerns.  

## 2015-06-26 DIAGNOSIS — L02214 Cutaneous abscess of groin: Secondary | ICD-10-CM | POA: Insufficient documentation

## 2015-07-03 ENCOUNTER — Encounter: Payer: Self-pay | Admitting: General Surgery

## 2015-07-03 ENCOUNTER — Ambulatory Visit (INDEPENDENT_AMBULATORY_CARE_PROVIDER_SITE_OTHER): Payer: BLUE CROSS/BLUE SHIELD | Admitting: General Surgery

## 2015-07-03 VITALS — BP 128/68 | HR 78 | Resp 14 | Ht 63.5 in | Wt 164.0 lb

## 2015-07-03 DIAGNOSIS — L02214 Cutaneous abscess of groin: Secondary | ICD-10-CM

## 2015-07-03 NOTE — Progress Notes (Signed)
Patient ID: Erika Williamson, female   DOB: [birth year], 39 y.o.   MRN: [id number]  Chief Complaint  Patient presents with  . Routine Post Op    HPI Erika Williamson is a 39 y.o. female.  Here today for follow up left groin biopsy, fibroadipose tissue. She states it is still a little painful and draining some and worse toward the end of the day.  HPI  Past Medical History  Diagnosis Date  . Tonsillitis   . Anxiety   . Shingles   . Atypical face pain     Left Sided  . Sinusitis   . Tension headache   . Foot fracture Left X2  . COPD (chronic obstructive pulmonary disease)   . Tobacco abuse   . Vitamin D deficiency disease   . HPV (human papilloma virus) infection   . High grade squamous intraepithelial cervical dysplasia   . GERD (gastroesophageal reflux disease)     Past Surgical History  Procedure Laterality Date  . Tubal ligation    . Tonsillectomy    . Wisdom tooth extraction    . Colonoscopy with propofol N/A 05/03/2015    Procedure: COLONOSCOPY WITH PROPOFOL;  Surgeon: Josefine Class, MD;  Location: [facility] ENDOSCOPY;  Service: Endoscopy;  Laterality: N/A;  . Esophagogastroduodenoscopy N/A 05/03/2015    Procedure: ESOPHAGOGASTRODUODENOSCOPY (EGD);  Surgeon: Josefine Class, MD;  Location: [facility] ENDOSCOPY;  Service: Endoscopy;  Laterality: N/A;  . Cesarean section      X2  . Cholecystectomy      Dr Burt Knack    Family History  Problem Relation Age of Onset  . Diabetes Mother   . Hypertension Mother   . COPD Mother   . Stroke Mother   . Osteoporosis Mother   . Cancer Father 26    colorectal, spread to lung  . Alcohol abuse Father   . Hypertension Father   . Diabetes Sister   . COPD Sister   . Diabetes Brother     Social History Social History  Substance Use Topics  . Smoking status: Current Every Day Smoker -- 1.00 packs/day for 22 years    Types: Cigarettes  . Smokeless tobacco: Never Used  . Alcohol Use: No    Allergies  Allergen  Reactions  . Ivp Dye [Iodinated Diagnostic Agents]   . Compazine [Prochlorperazine Edisylate]   . Erythromycin   . Levaquin [Levofloxacin In D5w]   . Naprosyn [Naproxen]   . Nsaids   . Pristiq [Desvenlafaxine]   . Ultram [Tramadol Hcl]     Current Outpatient Prescriptions  Medication Sig Dispense Refill  . Cholecalciferol (VITAMIN D-1000 MAX ST) 1000 UNITS tablet Take 1,000 Units by mouth daily.     Marland Kitchen esomeprazole (NEXIUM) 20 MG capsule Take 20 mg by mouth daily at 12 noon.    Marland Kitchen levonorgestrel (MIRENA) 20 MCG/24HR IUD 1 each by Intrauterine route once.    . Lidocaine, Anorectal, 5 % CREA Apply topically.    . lidocaine-prilocaine (EMLA) cream Apply 1 application topically as needed.    . Multiple Vitamin (MULTIVITAMIN) capsule Take 1 capsule by mouth daily.    . rizatriptan (MAXALT-MLT) 10 MG disintegrating tablet Take 10 mg by mouth as needed for migraine. May repeat in 2 hours if needed    . sucralfate (CARAFATE) 1 G tablet Take 1 g by mouth 4 (four) times daily -  with meals and at bedtime.     No current facility-administered medications for this visit.    Review of  Systems Review of Systems  Constitutional: Negative.   Respiratory: Negative.   Cardiovascular: Negative.     Blood pressure 128/68, pulse 78, resp. rate 14, height 5' 3.5" (1.613 m), weight 164 lb (74.39 kg), last menstrual period 06/01/2015.  Physical Exam Physical Exam  Constitutional: She is oriented to person, place, and time. She appears well-developed and well-nourished.  Abdominal:    Neurological: She is alert and oriented to person, place, and time.  Skin: Skin is warm and dry.  Psychiatric: Her behavior is normal.    Data Reviewed Soft tissue, abscess, left groin - FIBROADIPOSE TISSUE WITH DENSE INFLAMMATION, CONSISTENT WITH CLINICALLY STATED ABSCESS. - THERE IS NO EVIDENCE OF MALIGNANCY.  Assessment    Doing well status post excision left groin abscess.    Plan           Heating  pad as needed for comfort. Follow up as needed. The patient is aware to call back for any questions or concerns.  PCP:  Sheral Flow 07/03/2015, 4:25 PM

## 2015-07-03 NOTE — Patient Instructions (Addendum)
.   Heating pad as needed for comfort. Follow up as needed. The patient is aware to call back for any questions or concerns

## 2015-07-22 ENCOUNTER — Encounter: Payer: Self-pay | Admitting: Family Medicine

## 2015-07-22 ENCOUNTER — Ambulatory Visit (INDEPENDENT_AMBULATORY_CARE_PROVIDER_SITE_OTHER): Payer: BLUE CROSS/BLUE SHIELD | Admitting: Family Medicine

## 2015-07-22 VITALS — BP 137/84 | HR 87 | Temp 98.9°F | Ht 63.6 in | Wt 162.0 lb

## 2015-07-22 DIAGNOSIS — R350 Frequency of micturition: Secondary | ICD-10-CM | POA: Diagnosis not present

## 2015-07-22 DIAGNOSIS — E559 Vitamin D deficiency, unspecified: Secondary | ICD-10-CM

## 2015-07-22 DIAGNOSIS — R5383 Other fatigue: Secondary | ICD-10-CM | POA: Diagnosis not present

## 2015-07-22 DIAGNOSIS — F331 Major depressive disorder, recurrent, moderate: Secondary | ICD-10-CM

## 2015-07-22 DIAGNOSIS — R05 Cough: Secondary | ICD-10-CM

## 2015-07-22 DIAGNOSIS — F332 Major depressive disorder, recurrent severe without psychotic features: Secondary | ICD-10-CM

## 2015-07-22 DIAGNOSIS — F32A Depression, unspecified: Secondary | ICD-10-CM

## 2015-07-22 DIAGNOSIS — F329 Major depressive disorder, single episode, unspecified: Secondary | ICD-10-CM | POA: Diagnosis not present

## 2015-07-22 DIAGNOSIS — R059 Cough, unspecified: Secondary | ICD-10-CM

## 2015-07-22 HISTORY — DX: Major depressive disorder, recurrent, moderate: F33.1

## 2015-07-22 LAB — UA/M W/RFLX CULTURE, ROUTINE
BILIRUBIN UA: NEGATIVE
Glucose, UA: NEGATIVE
Ketones, UA: NEGATIVE
Leukocytes, UA: NEGATIVE
NITRITE UA: NEGATIVE
PROTEIN UA: NEGATIVE
RBC, UA: NEGATIVE
SPEC GRAV UA: 1.005 (ref 1.005–1.030)
Urobilinogen, Ur: 0.2 mg/dL (ref 0.2–1.0)
pH, UA: 5 (ref 5.0–7.5)

## 2015-07-22 MED ORDER — ESCITALOPRAM OXALATE 10 MG PO TABS: 10.0000 mg | ORAL_TABLET | Freq: Every day | ORAL | Status: DC

## 2015-07-22 MED ORDER — BENZONATATE 100 MG PO CAPS
100.0000 mg | ORAL_CAPSULE | Freq: Three times a day (TID) | ORAL | Status: DC | PRN
Start: 2015-07-22 — End: 2015-07-31

## 2015-07-22 NOTE — Patient Instructions (Addendum)
Start the new medicine for your mood If you have dark thoughts, use the contact numbers and reach out Stress reduction Use the cough medicine if needed Try vitamin C (orange juice if not diabetic or vitamin C tablets) and drink green tea to help your immune system during your illness Get plenty of rest and hydration   Steps to Elicit the Relaxation Response The following is the technique reprinted with permission from Dr. Billie Ruddy book The Relaxation Response pages 162-163 1. Sit quietly in a comfortable position. 2. Close your eyes. 3. Deeply relax all your muscles,  beginning at your feet and progressing up to your face.  Keep them relaxed. 4. Breathe through your nose.  Become aware of your breathing.  As you breathe out, say the word, "one"*,  silently to yourself. For example,  breathe in ... out, "one",- in .. out, "one", etc.  Breathe easily and naturally. 5. Continue for 10 to 20 minutes.  You may open your eyes to check the time, but do not use an alarm.  When you finish, sit quietly for several minutes,  at first with your eyes closed and later with your eyes opened.  Do not stand up for a few minutes. 6. Do not worry about whether you are successful  in achieving a deep level of relaxation.  Maintain a passive attitude and permit relaxation to occur at its own pace.  When distracting thoughts occur,  try to ignore them by not dwelling upon them  and return to repeating "one."  With practice, the response should come with little effort.  Practice the technique once or twice daily,  but not within two hours after any meal,  since the digestive processes seem to interfere with  the elicitation of the Relaxation Response. * It is better to use a soothing, mellifluous sound, preferably with no meaning. or association, to avoid stimulation of unnecessary thoughts - a mantra.

## 2015-07-22 NOTE — Assessment & Plan Note (Addendum)
Recheck labs and supplement if low

## 2015-07-22 NOTE — Progress Notes (Signed)
BP 137/84 mmHg  Pulse 87  Temp(Src) 98.9 F (37.2 C)  Ht 5' 3.6" (1.615 m)  Wt 162 lb (73.483 kg)  BMI 28.17 kg/m2  SpO2 99%  LMP 06/29/2015   Subjective:    Patient ID: Erika Williamson, female    DOB: [birth year], 39 y.o.   MRN: [id number]  HPI: Erika Williamson is a 40 y.o. female  Chief Complaint  Patient presents with  . URI    X 2 weeks  . Depression   Patient is here with worsening depression "I am at my breaking point"; "I know I need help"; "If I'm not crying, I'm angry" "I want to die"; she says there was a point last week when she talked to a friend of hers and end suggested they go get a few drinks; she told her she would drink until she's dead Suicide attempt at age 29 or 69, her mother was terminally ill When her mother died, she had a breakdown, didn't want to be around people; struggled for several years Father died, falling out with daughter; son was kicked out of the house 3 weeks ago Patient is not the victim of abuse Trouble focusing Tearfulness Irritability I asked about suicidal ideation and plan; she has thought about driving off of a bridge a few weeks ago; nothing currently She has prayed and asked God to guide her She usually holds everything in She is not interested in inpatient treatment; she has to be able to step away and come back to it she says; today at work, she is doing reset in jewelry, working but thinking; she thinks being in a hospital would be too overwhelming; she thinks being in the hospital would drive her crazy She came by herself; she says she is planning on going right back to work when she leaves here She is not on any medication; she says "I am a very strong person" No thoughts of hurting herself right now; she said she is not going to hurt herself; if she had really wanted to hurt herself, she wouldn't have come in today or made the appointment; she would have just done it; she wants to start medicine and feel better No  weapons on her right now Lots of guns in the home, but she says she would not want to go out that way; if she was going to hurt herself, she wants to go to sleep and never wake up Could not care less about cleaning; no motivation  Relevant past medical, surgical, family and social history reviewed and updated as indicated. No personal hx of thyroid disease, no family hx of thyroid disease Interim medical history since our last visit reviewed. Allergies and medications reviewed and updated.  Review of Systems  Constitutional: Positive for appetite change (poor appetite over the last few years, progressive) and unexpected weight change (weight gain, but recently some weight loss).  HENT: Positive for congestion, postnasal drip, rhinorrhea and sneezing. Negative for facial swelling, sinus pressure and sore throat.   Eyes: Negative for discharge.  Respiratory: Positive for cough ("God awful virus going around").   Gastrointestinal: Negative for blood in stool.  Endocrine: Positive for polydipsia and polyuria.  Genitourinary: Positive for frequency. Negative for hematuria.       Urinary incontinence; two weeks duration, with the cough mostly, but sometimes just goes  Skin: Negative for rash.  Hematological: Does not bruise/bleed easily.  Psychiatric/Behavioral: Positive for suicidal ideas, self-injury, dysphoric mood and decreased concentration. Negative for hallucinations and  confusion. The patient is nervous/anxious ("I've been very touchy"; works at [employer] the kids start screaming, I feel like I could strangle them").   Per HPI unless specifically indicated above     Objective:    BP 137/84 mmHg  Pulse 87  Temp(Src) 98.9 F (37.2 C)  Ht 5' 3.6" (1.615 m)  Wt 162 lb (73.483 kg)  BMI 28.17 kg/m2  SpO2 99%  LMP 06/29/2015  Wt Readings from Last 3 Encounters:  07/22/15 162 lb (73.483 kg)  07/03/15 164 lb (74.39 kg)  06/25/15 163 lb (73.936 kg)    Physical Exam   Constitutional: She appears well-developed and well-nourished.  HENT:  Right Ear: Hearing, tympanic membrane, external ear and ear canal normal.  Left Ear: Hearing, tympanic membrane, external ear and ear canal normal.  Nose: Rhinorrhea present.  Mouth/Throat: Oropharynx is clear and moist and mucous membranes are normal.  Eyes: EOM are normal. Right eye exhibits no discharge. Left eye exhibits no discharge. Right conjunctiva is not injected. Left conjunctiva is not injected. No scleral icterus.  Neck: No thyromegaly present.  Cardiovascular: Normal rate and regular rhythm.   Pulmonary/Chest: Effort normal and breath sounds normal.  Lymphadenopathy:    She has no cervical adenopathy.  Neurological: She displays no tremor.  Reflex Scores:      Patellar reflexes are 2+ on the right side and 2+ on the left side. Skin: No pallor.  Psychiatric: Her speech is normal and behavior is normal. Judgment normal. Her affect is not blunt, not labile and not inappropriate. Cognition and memory are normal. She exhibits a depressed mood. She expresses suicidal ideation.  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[place]) - Primary    Patient here to get help; not currently thinking of harming herself; demonstrating good judgment; has good support; I called the mobile crisis line and staff member came and talked with patient for approximately one hour; patient was given resources, follow-up, instructions, will start counseling, work with  psychiatrist; mobile crisis worker did not think hospitalization necessary; patient not in immediate danger; contracted verbally for safety; start medication; check labs to ensure hypothyroidism or low vit D or low B12 not exacerbating the situation; close f/u; call me or any of the crisis numbers if needed prior to next appointment; I am the doctor on-call all week      Relevant Medications   escitalopram (LEXAPRO) 10 MG tablet   Other Relevant Orders   TSH (Completed)   Vitamin B12 (Completed)   CBC with Differential/Platelet (Completed)   Fatigue   Relevant Orders   TSH (Completed)   Vitamin B12 (Completed)   CBC with Differential/Platelet (Completed)   Comprehensive metabolic panel (Completed)    Other Visit Diagnoses    Urinary frequency        check urine today    Relevant Orders    UA/M w/rflx Culture, Routine (Completed)    Cough        likely  related to viral URI; tessalon perles prescribed    Relevant Medications    benzonatate (TESSALON) 100 MG capsule       Follow up plan: Return in about 1 week (around 07/29/2015) for follow-up.  Meds ordered this encounter  Medications  . escitalopram (LEXAPRO) 10 MG tablet    Sig: Take 1 tablet (10 mg total) by mouth daily.    Dispense:  30 tablet    Refill:  0  . benzonatate (TESSALON) 100 MG capsule    Sig: Take 1 capsule (100 mg total) by mouth every 8 (eight) hours as needed for cough.    Dispense:  21 capsule    Refill:  0   An after-visit summary was printed and given to the patient at [place].  Please see the patient instructions which may contain other information and recommendations beyond what is mentioned above in the assessment and plan.  Face-to-face time with patient was more than 40 minutes, >50% time spent counseling and coordination of care

## 2015-07-23 ENCOUNTER — Encounter: Payer: Self-pay | Admitting: Family Medicine

## 2015-07-23 LAB — CBC WITH DIFFERENTIAL/PLATELET
BASOS: 1 %
Basophils Absolute: 0.1 10*3/uL (ref 0.0–0.2)
EOS (ABSOLUTE): 0.3 10*3/uL (ref 0.0–0.4)
EOS: 3 %
HEMATOCRIT: 39 % (ref 34.0–46.6)
HEMOGLOBIN: 13 g/dL (ref 11.1–15.9)
IMMATURE GRANULOCYTES: 0 %
Immature Grans (Abs): 0 10*3/uL (ref 0.0–0.1)
Lymphocytes Absolute: 3.2 10*3/uL — ABNORMAL HIGH (ref 0.7–3.1)
Lymphs: 34 %
MCH: 30.2 pg (ref 26.6–33.0)
MCHC: 33.3 g/dL (ref 31.5–35.7)
MCV: 91 fL (ref 79–97)
MONOCYTES: 6 %
MONOS ABS: 0.6 10*3/uL (ref 0.1–0.9)
NEUTROS PCT: 56 %
Neutrophils Absolute: 5.3 10*3/uL (ref 1.4–7.0)
Platelets: 383 10*3/uL — ABNORMAL HIGH (ref 150–379)
RBC: 4.3 x10E6/uL (ref 3.77–5.28)
RDW: 13.2 % (ref 12.3–15.4)
WBC: 9.4 10*3/uL (ref 3.4–10.8)

## 2015-07-23 LAB — COMPREHENSIVE METABOLIC PANEL
A/G RATIO: 1.5 (ref 1.1–2.5)
ALBUMIN: 4.3 g/dL (ref 3.5–5.5)
ALT: 16 IU/L (ref 0–32)
AST: 17 IU/L (ref 0–40)
Alkaline Phosphatase: 70 IU/L (ref 39–117)
BUN/Creatinine Ratio: 7 — ABNORMAL LOW (ref 9–23)
BUN: 4 mg/dL — ABNORMAL LOW (ref 6–24)
Bilirubin Total: 0.4 mg/dL (ref 0.0–1.2)
CALCIUM: 9.7 mg/dL (ref 8.7–10.2)
CHLORIDE: 102 mmol/L (ref 97–108)
CO2: 23 mmol/L (ref 18–29)
Creatinine, Ser: 0.55 mg/dL — ABNORMAL LOW (ref 0.57–1.00)
GFR calc Af Amer: 136 mL/min/{1.73_m2} (ref >59)
GFR calc non Af Amer: 118 mL/min/{1.73_m2} (ref >59)
GLOBULIN, TOTAL: 2.8 g/dL (ref 1.5–4.5)
Glucose: 89 mg/dL (ref 65–99)
Potassium: 4.2 mmol/L (ref 3.5–5.2)
Sodium: 142 mmol/L (ref 134–144)
TOTAL PROTEIN: 7.1 g/dL (ref 6.0–8.5)

## 2015-07-23 LAB — VITAMIN B12: Vitamin B-12: 838 pg/mL (ref 211–946)

## 2015-07-23 LAB — VITAMIN D 25 HYDROXY (VIT D DEFICIENCY, FRACTURES): Vit D, 25-Hydroxy: 34.7 ng/mL (ref 30.0–100.0)

## 2015-07-23 LAB — TSH: TSH: 1.71 u[IU]/mL (ref 0.450–4.500)

## 2015-07-27 ENCOUNTER — Telehealth: Payer: Self-pay | Admitting: Family Medicine

## 2015-07-27 MED ORDER — LORAZEPAM 0.5 MG PO TABS: 0.2500 mg | ORAL_TABLET | Freq: Three times a day (TID) | ORAL | Status: DC | PRN

## 2015-07-27 MED ORDER — ALPRAZOLAM 0.25 MG PO TABS: 0.2500 mg | ORAL_TABLET | Freq: Three times a day (TID) | ORAL | Status: DC | PRN

## 2015-07-27 NOTE — Telephone Encounter (Signed)
I called to check on patient; she is doing okay; no thoughts of self-harm, she is doing okay except for having episodes of feeling angry; we talked about changing antidepressants versus giving her a mild benzo to take the edge off; discussed her previous experience with alcohol (does it make her relaxed or angry or loopy or drunk, etc.); she has tolerated it well and she is usually upbeat and happy if she has something to drink; does not drink now; will give mild benzo to use only if really needed; if it makes her worse, then she is to stop and call me; do not drive or operate machinery if it affects her ability to do so; she has an appt to see me this week (today is Saturday); I am on-call all weekend, so please call me through on-call system if she wants to talk or needs anything; she agrees and thanked me for the call and for thinking about her; we also discussed stress reduction, pressure cooker scenario, relieving stress rather than letting it build, etc.

## 2015-07-27 NOTE — Assessment & Plan Note (Signed)
Patient here to get help; not currently thinking of harming herself; demonstrating good judgment; has good support; I called the mobile crisis line and staff member came and talked with patient for approximately one hour; patient was given resources, follow-up, instructions, will start counseling, work with psychiatrist; mobile crisis worker did not think hospitalization necessary; patient not in immediate danger; contracted verbally for safety; start medication; check labs to ensure hypothyroidism or low vit D or low B12 not exacerbating the situation; close f/u; call me or any of the crisis numbers if needed prior to next appointment; I am the doctor on-call all week

## 2015-07-27 NOTE — Telephone Encounter (Signed)
I cancelled the Rx for lorazepam; spoke with Joelene Millin in pharmacy around 11:15 am; sending in lower dose alprazolam with fewer pills; verbal rx given

## 2015-07-31 ENCOUNTER — Ambulatory Visit (INDEPENDENT_AMBULATORY_CARE_PROVIDER_SITE_OTHER): Payer: BLUE CROSS/BLUE SHIELD | Admitting: Family Medicine

## 2015-07-31 ENCOUNTER — Telehealth: Payer: Self-pay

## 2015-07-31 ENCOUNTER — Encounter: Payer: Self-pay | Admitting: Family Medicine

## 2015-07-31 VITALS — BP 126/80 | HR 79 | Temp 98.4°F | Wt 162.0 lb

## 2015-07-31 DIAGNOSIS — F332 Major depressive disorder, recurrent severe without psychotic features: Secondary | ICD-10-CM | POA: Diagnosis not present

## 2015-07-31 MED ORDER — VORTIOXETINE HBR 10 MG PO TABS: 1.0000 | ORAL_TABLET | Freq: Every day | ORAL | Status: DC

## 2015-07-31 NOTE — Patient Instructions (Addendum)
Please stop the escitalopram and start the new medicine, Trintellix Use the support numbers and crisis numbers that you have if needed If you have any worrisome thoughts, thoughts of self-harm or of harming others, call 911 immediately or have a trusted friend call 911 for you We'll refer you to see a psychiatrist If you start to feel worse before you get in to see that doctor, please go to the emergency department Return to see me in 5-7 days, but call sooner if needed

## 2015-07-31 NOTE — Telephone Encounter (Signed)
I called multiple offices today to try to get her in with Psych. I left messages at [facility], and Dr. Octavia Heir. I did not get an answer at [facility]. Dr. Waylan Boga office said their first appointment would be in Dec. I spoke with [facility] and they said they had openings as soon as the 21st. They prefer that the patient call their office to schedule the appointment. I checked with Dr. Sanda Klein and she said that the 21st would be just fine.

## 2015-07-31 NOTE — Telephone Encounter (Signed)
Patient notified to call [facility] tomorrow to schedule an appt. I advised her if any of the local places I attempted called back with sooner appointments, that i'd call her back and let her know, but for now plan to go to see [facility]. She understood and said she would call.

## 2015-07-31 NOTE — Progress Notes (Signed)
BP 126/80 mmHg  Pulse 79  Temp(Src) 98.4 F (36.9 C)  Wt 162 lb (73.483 kg)  SpO2 98%  LMP 07/23/2015 (Exact Date)   Subjective:    Patient ID: Erika Williamson, female    DOB: [birth year], 39 y.o.   MRN: [id number]  HPI: Erika Williamson is a 39 y.o. female  Chief Complaint  Patient presents with  . Depression   She is here for depression follow-up She reached out to her sister and her sister is struggling right now as well; they went out to lunch today; both parents are deceased; both she and her sister have empty nest issues; sister takes a lot of different medicine, but she doesn't know what she takes for depression Patient is still angry; little things will set her off; tried the medicine called in over the weekend and it didn't make any difference No thoughts of hurting herself, but thinking about hurting other people; screaming children at work makes her made, these kids are screaming bloody murder and "you just want to strangle the parents"; not thinking about hurting any one at home; no thoughts of self-harm; no appetite She does not want to see a counselor right now; she is going to start writing in a journal instead she says You would not believe how angry I get; she says will not hurt herself, but would hurt somebody else Her husband does not know what she's dealing with; she is frustrated and irritated; doesn't want people bothering me No one knows her better than her sister she says, and she is healthy and supportive; her sister knows what to say to her to put her back in the right frame She is feeling down, but feeling better; she is not feeling like she was, she says She can feel herself moving in the right direction; sister is dragging her to church with her on Sunday; sister is coming to get her on Sunday, and she is comfortable with that She was praying on her way home yesterday; the Reita Cliche is guiding me she says Reviewed TSH drawn 07/22/15, normal; also had  normal CBC, vit D, vit B12   Depression screen [facility] 2/9 07/31/2015 07/22/2015  Decreased Interest 3 3  Down, Depressed, Hopeless 3 3  PHQ - 2 Score 6 6  Altered sleeping 0 1  Tired, decreased energy 3 3  Change in appetite 3 3  Feeling bad or failure about yourself  3 3  Trouble concentrating 3 3  Moving slowly or fidgety/restless 0 0  Suicidal thoughts 3 3  PHQ-9 Score 21 22  Difficult doing work/chores Very difficult -    Relevant past medical, surgical, family and social history reviewed and updated as indicated. Interim medical history since our last visit reviewed. Allergies and medications reviewed and updated.  Review of Systems  Per HPI unless specifically indicated above     Objective:    BP 126/80 mmHg  Pulse 79  Temp(Src) 98.4 F (36.9 C)  Wt 162 lb (73.483 kg)  SpO2 98%  LMP 07/23/2015 (Exact Date)  Wt Readings from Last 3 Encounters:  07/31/15 162 lb (73.483 kg)  07/22/15 162 lb (73.483 kg)  07/03/15 164 lb (74.39 kg)    Physical Exam  Constitutional: She appears well-developed and well-nourished. No distress.  Eyes: EOM are normal. No scleral icterus.  Neck: No thyromegaly present.  Cardiovascular: Normal rate.   Pulmonary/Chest: Effort normal.  Abdominal: She exhibits no distension.  Skin: No pallor.  Psychiatric: She has  a normal mood and affect. Her behavior is normal. Judgment and thought content normal. Her affect is not blunt, not labile and not inappropriate. Her speech is not rapid and/or pressured and not delayed. She is not slowed and not withdrawn. Cognition and memory are normal. She does not express impulsivity or inappropriate judgment. She does not exhibit a depressed mood. She is attentive.   Results for orders placed or performed in visit on 07/22/15  UA/M w/rflx Culture, Routine  Result Value Ref Range   Specific Gravity, UA 1.005 1.005 - 1.030   pH, UA 5.0 5.0 - 7.5   Color, UA Yellow Yellow   Appearance Ur Clear Clear    Leukocytes, UA Negative Negative   Protein, UA Negative Negative/Trace   Glucose, UA Negative Negative   Ketones, UA Negative Negative   RBC, UA Negative Negative   Bilirubin, UA Negative Negative   Urobilinogen, Ur 0.2 0.2 - 1.0 mg/dL   Nitrite, UA Negative Negative  TSH  Result Value Ref Range   TSH 1.710 0.450 - 4.500 uIU/mL  Vit D  25 hydroxy (rtn osteoporosis monitoring)  Result Value Ref Range   Vit D, 25-Hydroxy 34.7 30.0 - 100.0 ng/mL  Vitamin B12  Result Value Ref Range   Vitamin B-12 838 211 - 946 pg/mL  CBC with Differential/Platelet  Result Value Ref Range   WBC 9.4 3.4 - 10.8 x10E3/uL   RBC 4.30 3.77 - 5.28 x10E6/uL   Hemoglobin 13.0 11.1 - 15.9 g/dL   Hematocrit 39.0 34.0 - 46.6 %   MCV 91 79 - 97 fL   MCH 30.2 26.6 - 33.0 pg   MCHC 33.3 31.5 - 35.7 g/dL   RDW 13.2 12.3 - 15.4 %   Platelets 383 (H) 150 - 379 x10E3/uL   Neutrophils 56 %   Lymphs 34 %   Monocytes 6 %   Eos 3 %   Basos 1 %   Neutrophils Absolute 5.3 1.4 - 7.0 x10E3/uL   Lymphocytes Absolute 3.2 (H) 0.7 - 3.1 x10E3/uL   Monocytes Absolute 0.6 0.1 - 0.9 x10E3/uL   EOS (ABSOLUTE) 0.3 0.0 - 0.4 x10E3/uL   Basophils Absolute 0.1 0.0 - 0.2 x10E3/uL   Immature Granulocytes 0 %   Immature Grans (Abs) 0.0 0.0 - 0.1 x10E3/uL  Comprehensive metabolic panel  Result Value Ref Range   Glucose 89 65 - 99 mg/dL   BUN 4 (L) 6 - 24 mg/dL   Creatinine, Ser 0.55 (L) 0.57 - 1.00 mg/dL   GFR calc non Af Amer 118 >59 mL/min/1.73   GFR calc Af Amer 136 >59 mL/min/1.73   BUN/Creatinine Ratio 7 (L) 9 - 23   Sodium 142 134 - 144 mmol/L   Potassium 4.2 3.5 - 5.2 mmol/L   Chloride 102 97 - 108 mmol/L   CO2 23 18 - 29 mmol/L   Calcium 9.7 8.7 - 10.2 mg/dL   Total Protein 7.1 6.0 - 8.5 g/dL   Albumin 4.3 3.5 - 5.5 g/dL   Globulin, Total 2.8 1.5 - 4.5 g/dL   Albumin/Globulin Ratio 1.5 1.1 - 2.5   Bilirubin Total 0.4 0.0 - 1.2 mg/dL   Alkaline Phosphatase 70 39 - 117 IU/L   AST 17 0 - 40 IU/L   ALT 16 0 - 32  IU/L      Assessment & Plan:   Problem List Items Addressed This Visit      Other   Severe recurrent major depression ([place]) - Primary    Supportive  listening; she contracted for safety verbally for self-harm and hurting others; will switch from escitalopram to Trintellix      Relevant Orders   Ambulatory referral to Psychiatry      Follow up plan: Return 5-7 days, for follow-up.  Orders Placed This Encounter  Procedures  . Ambulatory referral to Psychiatry   Face-to-face time with patient was more than 25 minutes, >50% time spent counseling and coordination of care  An after-visit summary was printed and given to the patient at [facility].  Please see the patient instructions which may contain other information and recommendations beyond what is mentioned above in the assessment and plan.

## 2015-07-31 NOTE — Assessment & Plan Note (Signed)
Supportive listening; she contracted for safety verbally for self-harm and hurting others; will switch from escitalopram to Trintellix

## 2015-08-01 ENCOUNTER — Telehealth: Payer: Self-pay | Admitting: Family Medicine

## 2015-08-01 MED ORDER — ESCITALOPRAM OXALATE 10 MG PO TABS: 15.0000 mg | ORAL_TABLET | Freq: Every day | ORAL | Status: DC

## 2015-08-01 NOTE — Telephone Encounter (Signed)
Patient notified. She also said her manager is going to let her be off the last 6 days. She will call the psych doctor tomorrow, she is just now leaving work.

## 2015-08-01 NOTE — Telephone Encounter (Signed)
Pts trinellix medication isn't covered under her insurance and she would to have something else sent to [address]

## 2015-08-01 NOTE — Telephone Encounter (Signed)
See if there are savings cards; I really think this is the best thing for her, even if expensive

## 2015-08-01 NOTE — Telephone Encounter (Signed)
Routing to provider  

## 2015-08-01 NOTE — Telephone Encounter (Signed)
Dr. Sanda Klein, the coupon will only take up to $100 off, which would still put the cost at over $200.

## 2015-08-01 NOTE — Telephone Encounter (Signed)
Pt still can't afford that because it only takes off $100 and it will still cost her $276

## 2015-08-01 NOTE — Telephone Encounter (Signed)
I think I'm understanding that she is not willing to spend that much money for this medicine; if she will get it, that would be my first choice If she simply cannot afford that, I understand; if she can't get it, then have her go up to 15 mg of Lexapro (escitalopram) once a day

## 2015-08-01 NOTE — Telephone Encounter (Signed)
Left message to call.

## 2015-08-01 NOTE — Telephone Encounter (Signed)
We have a coupon. Left message to call.

## 2015-08-07 ENCOUNTER — Encounter: Payer: Self-pay | Admitting: Family Medicine

## 2015-08-07 ENCOUNTER — Ambulatory Visit (INDEPENDENT_AMBULATORY_CARE_PROVIDER_SITE_OTHER): Payer: BLUE CROSS/BLUE SHIELD | Admitting: Family Medicine

## 2015-08-07 VITALS — BP 99/66 | HR 77 | Temp 99.3°F | Wt 159.0 lb

## 2015-08-07 DIAGNOSIS — F332 Major depressive disorder, recurrent severe without psychotic features: Secondary | ICD-10-CM | POA: Diagnosis not present

## 2015-08-07 DIAGNOSIS — M7918 Myalgia, other site: Secondary | ICD-10-CM | POA: Insufficient documentation

## 2015-08-07 DIAGNOSIS — R0782 Intercostal pain: Secondary | ICD-10-CM | POA: Diagnosis not present

## 2015-08-07 MED ORDER — ESCITALOPRAM OXALATE 10 MG PO TABS: 15.0000 mg | ORAL_TABLET | Freq: Every day | ORAL | Status: DC

## 2015-08-07 NOTE — Assessment & Plan Note (Signed)
Much improved, she is going to continue the current medicine; she is wanting to wait to see the counselor, though she was given number to reach another psychiatrist who could see her sooner

## 2015-08-07 NOTE — Patient Instructions (Signed)
Continue the 15 mg of escitalopram daily Do see the counselor Call me if needed Do use topical warmth over the sore area on the right side If you develop fever, worsening cough, etc then seek medical care

## 2015-08-07 NOTE — Assessment & Plan Note (Signed)
After coughing, likely strain; possible too to have a rib out of alignment; if not improving, then return to see Dr. Wynetta Emery; tessalon perles okay

## 2015-08-07 NOTE — Progress Notes (Signed)
BP 99/66 mmHg  Pulse 77  Temp(Src) 99.3 F (37.4 C)  Wt 159 lb (72.122 kg)  SpO2 97%  LMP 07/23/2015 (Exact Date)   Subjective:    Patient ID: Erika Williamson, female    DOB: 1976-07-21, 39 y.o.   MRN: [id number]  HPI: Erika Williamson is a 39 y.o. female  Chief Complaint  Patient presents with  . Depression    She got a call from [facility] and will be seeing the therapist on the 31st.  . Rib Injury    Patient states she has been coughing alot and now her ribs are very sore.   Depression screen [facility] 2/9 08/07/2015 07/31/2015 07/22/2015  Decreased Interest 2 3 3   Down, Depressed, Hopeless 2 3 3   PHQ - 2 Score 4 6 6   Altered sleeping 0 0 1  Tired, decreased energy 2 3 3   Change in appetite 3 3 3   Feeling bad or failure about yourself  2 3 3   Trouble concentrating 0 3 3  Moving slowly or fidgety/restless 1 0 0  Suicidal thoughts 1 3 3   PHQ-9 Score 13 21 22   Difficult doing work/chores Not difficult at all Very difficult -   She said she was actually singing on Saturday; she got up and took her shower, got dressed, doing housework Within just 2 days of increasing the dose on the medicine, her husband noticed too She went to church with her sister on Sunday; enjoyed the sermon, enjoyed the meal Not craving sugars Took some days off from work; she is going to [employer] this weekend  She has been coughing too and has been taking tessalon perles; coughing fit after church; thinks she strained something on the ribs on the right side; slight swelling, coughing up what's draining down her throat, just clear; moves into the chest and gags her  Relevant past medical, surgical, family and social history reviewed and updated as indicated. Interim medical history since our last visit reviewed. Allergies and medications reviewed and updated.  Review of Systems  Per HPI unless specifically indicated above     Objective:    BP 99/66 mmHg  Pulse 77  Temp(Src) 99.3 F (37.4  C)  Wt 159 lb (72.122 kg)  SpO2 97%  LMP 07/23/2015 (Exact Date)  Wt Readings from Last 3 Encounters:  08/07/15 159 lb (72.122 kg)  07/31/15 162 lb (73.483 kg)  07/22/15 162 lb (73.483 kg)    Physical Exam  Constitutional: She appears well-developed and well-nourished.  Cardiovascular: Normal rate and regular rhythm.   Pulmonary/Chest: Effort normal and breath sounds normal. She has no decreased breath sounds. She has no wheezes. She has no rhonchi. She exhibits tenderness.  Below right breast along anterolateral ribcage, intercostal muscles; also tender to palpation over the T5 right rib origin  Skin: Rash (specifically, no vesicles or rash over the right side chest wall) noted.  Psychiatric: She has a normal mood and affect. Her behavior is normal. Her mood appears not anxious. Her affect is not blunt. Her speech is not rapid and/or pressured. She is not aggressive, not slowed and not withdrawn. She does not exhibit a depressed mood.      Assessment & Plan:   Problem List Items Addressed This Visit      Other   Severe recurrent major depression ([place])    Much improved, she is going to continue the current medicine; she is wanting to wait to see the counselor, though she was given number  to reach another psychiatrist who could see her sooner      Relevant Medications   escitalopram (LEXAPRO) 10 MG tablet   Intercostal muscle pain - Primary    After coughing, likely strain; possible too to have a rib out of alignment; if not improving, then return to see Dr. Wynetta Emery; tessalon perles okay         Follow up plan: Return in about 1 month (around 09/07/2015) for recheck.  An after-visit summary was printed and given to the patient at [place].  Please see the patient instructions which may contain other information and recommendations beyond what is mentioned above in the assessment and plan. Meds ordered this encounter  Medications  . escitalopram (LEXAPRO) 10 MG tablet     Sig: Take 1.5 tablets (15 mg total) by mouth daily.    Dispense:  45 tablet    Refill:  3

## 2015-08-09 ENCOUNTER — Telehealth: Payer: Self-pay | Admitting: Family Medicine

## 2015-08-09 MED ORDER — PREDNISONE 10 MG PO TABS
ORAL_TABLET | ORAL | Status: DC
Start: 2015-08-09 — End: 2015-09-04

## 2015-08-09 NOTE — Telephone Encounter (Signed)
Routing to provider  

## 2015-08-09 NOTE — Telephone Encounter (Signed)
She says that she has a little swelling around along the spine and hurts to lean back against; wraps around and hurts to breathe; she is allergic to NSAID If getting worse, go to ER or urgent care

## 2015-08-09 NOTE — Telephone Encounter (Signed)
Pt called, having increased rib pain.  I could not get her in until Monday for an OMM appt with Dr Wynetta Emery.  She declined appt and asked that I send message to Dr Sanda Klein a message to call her.

## 2015-08-19 ENCOUNTER — Ambulatory Visit (INDEPENDENT_AMBULATORY_CARE_PROVIDER_SITE_OTHER): Payer: BLUE CROSS/BLUE SHIELD | Admitting: Licensed Clinical Social Worker

## 2015-08-19 DIAGNOSIS — F332 Major depressive disorder, recurrent severe without psychotic features: Secondary | ICD-10-CM
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
## 2015-08-23 ENCOUNTER — Telehealth: Payer: Self-pay | Admitting: Family Medicine

## 2015-08-23 ENCOUNTER — Telehealth: Payer: Self-pay

## 2015-08-23 ENCOUNTER — Other Ambulatory Visit: Payer: Self-pay | Admitting: Family Medicine

## 2015-08-23 MED ORDER — FIRST-DUKES MOUTHWASH MT SUSP: 5.0000 mL | Freq: Four times a day (QID) | OROMUCOSAL | Status: DC | PRN

## 2015-08-23 MED ORDER — VALACYCLOVIR HCL 1 G PO TABS
ORAL_TABLET | ORAL | Status: DC
Start: 2015-08-23 — End: 2017-02-10

## 2015-08-23 NOTE — Telephone Encounter (Signed)
Routing to provider  

## 2015-08-23 NOTE — Telephone Encounter (Signed)
Yes, I sent in Rx and hope it helps; best I can do without actually seeing them If getting worse, have this evaluated (urgent care over weekend)

## 2015-08-23 NOTE — Telephone Encounter (Signed)
Patient notified

## 2015-08-23 NOTE — Telephone Encounter (Signed)
Pt says she has a mouth full of ulcers and they are starting to feel like they are going down her throat. She would like to know if something could be sent to [address]

## 2015-08-23 NOTE — Telephone Encounter (Signed)
Pharm states there is no first-dukes mouthwash, they would like to change to [facility]'s magic mouthwash. Is that ok?

## 2015-08-23 NOTE — Telephone Encounter (Signed)
Yes, that fine; form faxed

## 2015-09-04 ENCOUNTER — Encounter: Payer: Self-pay | Admitting: Family Medicine

## 2015-09-04 ENCOUNTER — Encounter: Payer: Self-pay | Admitting: Psychiatry

## 2015-09-04 ENCOUNTER — Ambulatory Visit (INDEPENDENT_AMBULATORY_CARE_PROVIDER_SITE_OTHER): Payer: BLUE CROSS/BLUE SHIELD | Admitting: Psychiatry

## 2015-09-04 ENCOUNTER — Ambulatory Visit (INDEPENDENT_AMBULATORY_CARE_PROVIDER_SITE_OTHER): Payer: BLUE CROSS/BLUE SHIELD | Admitting: Licensed Clinical Social Worker

## 2015-09-04 ENCOUNTER — Ambulatory Visit (INDEPENDENT_AMBULATORY_CARE_PROVIDER_SITE_OTHER): Payer: BLUE CROSS/BLUE SHIELD | Admitting: Family Medicine

## 2015-09-04 VITALS — BP 122/82 | HR 83 | Temp 99.3°F | Ht 63.0 in | Wt 160.6 lb

## 2015-09-04 VITALS — BP 112/74 | HR 88 | Temp 99.1°F | Wt 159.0 lb

## 2015-09-04 DIAGNOSIS — H6123 Impacted cerumen, bilateral: Secondary | ICD-10-CM | POA: Diagnosis not present

## 2015-09-04 DIAGNOSIS — M79674 Pain in right toe(s): Secondary | ICD-10-CM | POA: Diagnosis not present

## 2015-09-04 DIAGNOSIS — Z720 Tobacco use: Secondary | ICD-10-CM

## 2015-09-04 DIAGNOSIS — T39395S Adverse effect of other nonsteroidal anti-inflammatory drugs [NSAID], sequela: Secondary | ICD-10-CM

## 2015-09-04 DIAGNOSIS — F332 Major depressive disorder, recurrent severe without psychotic features: Secondary | ICD-10-CM

## 2015-09-04 DIAGNOSIS — R87613 High grade squamous intraepithelial lesion on cytologic smear of cervix (HGSIL): Secondary | ICD-10-CM | POA: Diagnosis not present

## 2015-09-04 MED ORDER — ESCITALOPRAM OXALATE 20 MG PO TABS: 20.0000 mg | ORAL_TABLET | Freq: Every day | ORAL | Status: DC

## 2015-09-04 NOTE — Progress Notes (Signed)
BP 112/74 mmHg  Pulse 88  Temp(Src) 99.1 F (37.3 C)  Wt 159 lb (72.122 kg)  SpO2 97%  LMP 08/16/2015   Subjective:    Patient ID: Erika Williamson, female    DOB: June 25, 1976, 39 y.o.   MRN: [id number]  HPI: Erika Williamson is a 39 y.o. female  Chief Complaint  Patient presents with  . Ear Pain    right ear pain  . Toe Pain    left big toe   She had lots of ulcers in her mouth, two weeks ago; healed up except for the one on her large torus; she showed me pictures of these; nearly resolved  She is seeing Dr. Faith Rogue (psychiatrist) and he upped her medicine, lexapro 20 mg daily; if that doesn't help, they will do something else; he is concerned about her anger too; she is hoping to not take this forever She had counseling; general conversation, bonding, she listens very well She is going to be working from 2-11 on Thanksgiving She is going to church, going with sister, but even went by herself last time  She is not quite ready to quit smoking  Right ear pain; last week she couldn't hear out of it; still feels full, like something is back there, like she had wax; fills up  Left big toe is swollen; not sure; had to call out of work yesterday; wore different boots; no hx of gout; no [place] or gravy; no recent piercings or tattoos; slight temp, 99.3 degrees  Relevant past medical, surgical, family and social history reviewed and updated as indicated. Interim medical history since our last visit reviewed. Allergies and medications reviewed and updated.  Review of Systems  Per HPI unless specifically indicated above     Objective:    BP 112/74 mmHg  Pulse 88  Temp(Src) 99.1 F (37.3 C)  Wt 159 lb (72.122 kg)  SpO2 97%  LMP 08/16/2015  Wt Readings from Last 3 Encounters:  09/04/15 159 lb (72.122 kg)  09/04/15 160 lb 9.6 oz (72.848 kg)  08/07/15 159 lb (72.122 kg)    Physical Exam  Constitutional: She appears well-developed and well-nourished.  HENT:   Right Ear: Hearing normal.  Left Ear: Hearing normal.  Both ears packed with cerumen; removed with irrigation and curette; tolerated by patient well  Cardiovascular: Normal rate and regular rhythm.   Pulmonary/Chest: Effort normal and breath sounds normal.  Musculoskeletal:       Left foot: There is swelling (left great toe mildly erythematous and swollen, with little decrease in ROM / flexion).  Neurological: She displays no tremor.  Psychiatric: She has a normal mood and affect. Judgment and thought content normal. She does not exhibit a depressed mood.  Light-hearted and joking, laughed quite a bit during our visit    Results for orders placed or performed in visit on 07/22/15  UA/M w/rflx Culture, Routine  Result Value Ref Range   Specific Gravity, UA 1.005 1.005 - 1.030   pH, UA 5.0 5.0 - 7.5   Color, UA Yellow Yellow   Appearance Ur Clear Clear   Leukocytes, UA Negative Negative   Protein, UA Negative Negative/Trace   Glucose, UA Negative Negative   Ketones, UA Negative Negative   RBC, UA Negative Negative   Bilirubin, UA Negative Negative   Urobilinogen, Ur 0.2 0.2 - 1.0 mg/dL   Nitrite, UA Negative Negative  TSH  Result Value Ref Range   TSH 1.710 0.450 - 4.500 uIU/mL  Vit  D  25 hydroxy (rtn osteoporosis monitoring)  Result Value Ref Range   Vit D, 25-Hydroxy 34.7 30.0 - 100.0 ng/mL  Vitamin B12  Result Value Ref Range   Vitamin B-12 838 211 - 946 pg/mL  CBC with Differential/Platelet  Result Value Ref Range   WBC 9.4 3.4 - 10.8 x10E3/uL   RBC 4.30 3.77 - 5.28 x10E6/uL   Hemoglobin 13.0 11.1 - 15.9 g/dL   Hematocrit 39.0 34.0 - 46.6 %   MCV 91 79 - 97 fL   MCH 30.2 26.6 - 33.0 pg   MCHC 33.3 31.5 - 35.7 g/dL   RDW 13.2 12.3 - 15.4 %   Platelets 383 (H) 150 - 379 x10E3/uL   Neutrophils 56 %   Lymphs 34 %   Monocytes 6 %   Eos 3 %   Basos 1 %   Neutrophils Absolute 5.3 1.4 - 7.0 x10E3/uL   Lymphocytes Absolute 3.2 (H) 0.7 - 3.1 x10E3/uL   Monocytes  Absolute 0.6 0.1 - 0.9 x10E3/uL   EOS (ABSOLUTE) 0.3 0.0 - 0.4 x10E3/uL   Basophils Absolute 0.1 0.0 - 0.2 x10E3/uL   Immature Granulocytes 0 %   Immature Grans (Abs) 0.0 0.0 - 0.1 x10E3/uL  Comprehensive metabolic panel  Result Value Ref Range   Glucose 89 65 - 99 mg/dL   BUN 4 (L) 6 - 24 mg/dL   Creatinine, Ser 0.55 (L) 0.57 - 1.00 mg/dL   GFR calc non Af Amer 118 >59 mL/min/1.73   GFR calc Af Amer 136 >59 mL/min/1.73   BUN/Creatinine Ratio 7 (L) 9 - 23   Sodium 142 134 - 144 mmol/L   Potassium 4.2 3.5 - 5.2 mmol/L   Chloride 102 97 - 108 mmol/L   CO2 23 18 - 29 mmol/L   Calcium 9.7 8.7 - 10.2 mg/dL   Total Protein 7.1 6.0 - 8.5 g/dL   Albumin 4.3 3.5 - 5.5 g/dL   Globulin, Total 2.8 1.5 - 4.5 g/dL   Albumin/Globulin Ratio 1.5 1.1 - 2.5   Bilirubin Total 0.4 0.0 - 1.2 mg/dL   Alkaline Phosphatase 70 39 - 117 IU/L   AST 17 0 - 40 IU/L   ALT 16 0 - 32 IU/L      Assessment & Plan:   Problem List Items Addressed This Visit      Nervous and Auditory   Cerumen impaction     Other   Tobacco abuse    She is unfortunately not ready to quit just yet; I am here to help if/when needed      High grade squamous intraepithelial cervical dysplasia    Going to see Dr. 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    So glad she is working with psychiatrist; patient certainly sounds and appears to have improved substantially; supportive listening provided; encouraged her to talk with her counselor and her psychiatrist about the upcoming holiday season and dealing with any stress associated with that (she works in Firefighter at [employer])      Toe pain, right    Suspicious for mild case of gout; discussed possible triggers; ddx also includes pseudogout, prolonged standing, poorly fitting shoes, etc.; she cannot take NSAIDs; return or call if recurs for labs (uric acid)      NSAIDs adverse reaction    Patient cannot take NSAIDs; for possible gout on  great toe, will NOT prescribe indomethacin or any other NSAID  Follow up plan: Return 3-4 months, for follow-up.  An after-visit summary was printed and given to the patient at [place].  Please see the patient instructions which may contain other information and recommendations beyond what is mentioned above in the assessment and plan.

## 2015-09-04 NOTE — Progress Notes (Signed)
Psychiatric Initial Adult Assessment   Patient Identification: SOPHIA MUHS MRN:  [id number] Date of Evaluation:  09/04/2015 Referral Source: Primary care physician/counselor Chief Complaint:  "A month ago I was ready to kill myself." Chief Complaint    Establish Care; Depression; Fatigue; Stress     Visit Diagnosis:    ICD-9-CM ICD-10-CM   1. Severe episode of recurrent major depressive disorder, without psychotic features ([place]) 296.33 F33.2    Diagnosis:   Patient Active Problem List   Diagnosis Date Noted  . Intercostal muscle pain [R07.82] 08/07/2015  . Severe recurrent major depression ([place]) [F33.2] 07/22/2015  . Fatigue [R53.83] 07/22/2015  . Gluten intolerance [K90.0] 06/19/2015  . Tobacco abuse [Z72.0]   . Vitamin D deficiency disease [E55.9]   . 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 High grade squamous intraepithelial cervical dysplasia [R87.613]   . GERD (gastroesophageal reflux disease) [K21.9]    History of Present Illness:  Patient indicated that aims have been building up since her father died in 02-17-11 in regards to mood and depression. She states that her depression worsened because she started to feel alone in the world. States that her son got into a fight with her husband and was kicked out of the house in September. She states that she has had a distant relationship from her only sister and this is somewhat been improving over the past month but that was also another stressor.  She states that, for the past 2 years she's been experiencing increased sleep, anhedonia, low energy, increased appetite for carbohydrates and depressed mood. She states that her concentration is good at work but poor at home.  She sought help from her primary care physician in October 2016. She was placed on escitalopram 10 mg daily on 07/22/2015. She states that she developed irritability and anger and then around October 12 her primary care physician attempted to  prescribe her trintellix, but patient never filled the prescription due to cost. She states that her primary care decided to increase her escitalopram from 10 mg to 15 mg. The patient states that her irritability improved on the 15 mg dose. His primary care physician also prescribed her some alprazolam 0.25 mg every 8 hours as needed. However patient indicates that she has found this medication be ineffective.  In regards to her anxiety she states that certain things can trigger her anxiety such as hearing a baby crying, noises and she reports that sometimes she'll wake up jumpy. She states she also has issues with anger and this is been the case over the past 2 months. Elements:  Duration:  as noted above. Associated Signs/Symptoms: Depression Symptoms:  depressed mood, anhedonia, hypersomnia, fatigue, difficulty concentrating, anxiety, loss of energy/fatigue, increased appetite, (Hypo) Manic Symptoms:  None Anxiety Symptoms:  Some anxious response to triggers as noted above Psychotic Symptoms:  None PTSD Symptoms: NA  Past Medical History:  Past Medical History  Diagnosis Date  . Tonsillitis   . Anxiety   . Shingles   . Atypical face pain     Left Sided  . Sinusitis   . Tension headache   . Foot fracture Left X2  . COPD (chronic obstructive pulmonary disease) ([place])   . Tobacco abuse   . Vitamin D deficiency disease   . HPV (human papilloma virus) infection   . High grade squamous intraepithelial cervical dysplasia   . GERD (gastroesophageal reflux disease)     Past Surgical History  Procedure Laterality Date  . Tubal ligation    .  Tonsillectomy    . Wisdom tooth extraction    . Colonoscopy with propofol N/A 05/03/2015    Procedure: COLONOSCOPY WITH PROPOFOL;  Surgeon: Josefine Class, MD;  Location[facility] ENDOSCOPY;  Service: Endoscopy;  Laterality: N/A;  . Esophagogastroduodenoscopy N/A 05/03/2015    Procedure: ESOPHAGOGASTRODUODENOSCOPY (EGD);  Surgeon: Josefine Class, MD;  Location: [facility] ENDOSCOPY;  Service: Endoscopy;  Laterality: N/A;  . Cesarean section      X2  . Cholecystectomy      Dr Burt Knack   Family History:  Family History  Problem Relation Age of Onset  . Diabetes Mother   . Hypertension Mother   . COPD Mother   . Stroke Mother   . Osteoporosis Mother   . Depression Mother   . Cancer Father 73    colorectal, spread to lung  . Alcohol abuse Father   . Hypertension Father   . Depression Father   . Diabetes Sister   . COPD Sister   . Anxiety disorder Sister   . Depression Sister   . Fibromyalgia Sister   . Asthma Sister   . ADD / ADHD Son   . ADD / ADHD Daughter    Social History:   Social History   Social History  . Marital Status: Married    Spouse Name: N/A  . Number of Children: N/A  . Years of Education: N/A   Social History Main Topics  . Smoking status: Current Every Day Smoker -- 1.00 packs/day for 22 years    Types: Cigarettes    Start date: 09/03/1993  . Smokeless tobacco: Never Used  . Alcohol Use: No  . Drug Use: No  . Sexual Activity: Yes   Other Topics Concern  . None   Social History Narrative   Additional Social History:  Marital Status/Living:Currently on her 3rd marriage/Married for the past 4 years/lives with husband, 2 dogs and 1 cat  Employment History:Fulltime at [employer]; Attended [employer]; Graduated in 1994, [employer] 3.71; skipped school often, wanted to join [employer] but her mother was terminally ill; enjoyed [place]  Family/Childhood History: Born in [place]; has an older sister who is 68 years older; father was physically and verbally abusive to her mother and sister denies abuse towards her.  Father was an alcoholic.   Children/Grand-children: Harrell Gave (CJ) 42, [place] 19/no grandchildren  Family history of psychiatric illness: Patient states she has a sister was treated for anxiety and depression. She states that her father had issues with depression but also states he was an alcoholic. She states as a grandparent who had issues with alcoholism. Musculoskeletal: Strength & Muscle Tone: within normal limits Gait & Station: normal Patient leans: N/A  Psychiatric Specialty Exam: HPI  Review of Systems  Psychiatric/Behavioral: Positive for depression (improved on current medication). Negative for suicidal ideas, hallucinations, memory loss and substance abuse. The patient is not nervous/anxious and does not have insomnia (Patient reports hypersomnia).   All other systems reviewed and are negative.   Blood pressure 122/82, pulse 83, temperature 99.3 F (37.4 C), temperature source Tympanic, height 5\' 3"  (1.6 m), weight 160 lb 9.6 oz (72.848 kg), last menstrual period 08/16/2015, SpO2 96 %.Body mass index is 28.46 kg/(m^2).  General Appearance: Well Groomed  Eye Contact:  Good  Speech:  Normal Rate  Volume:  Normal  Mood:  Good  Affect:  Congruent able to smile   Thought Process:  Linear  Orientation:  Full (Time, Place, and Person)  Thought Content:  Negative  Suicidal Thoughts:  No  Homicidal Thoughts:  No  Memory:  Immediate;   Good Recent;   Good Remote;   Good  Judgement:  Good  Insight:  Good  Psychomotor Activity:  Negative  Concentration:  Good  Recall:  Good  Fund of Knowledge:Good  Language: Good  Akathisia:  Negative  Handed:    AIMS (if indicated):    Assets:  Communication Skills Desire for Improvement Social Support Vocational/Educational  ADL's:  Intact  Cognition: WNL  Sleep:  increased   Is the patient at risk to self?  No. Has the patient been a risk to self in the past 6 months?  No. Has the patient been a  risk to self within the distant past?  No. Is the patient a risk to others?  No. Has the patient been a risk to others in the past 6 months?  No. Has the patient been a risk to others within the distant past?  No.  Allergies:   Allergies  Allergen Reactions  . Ivp Dye [Iodinated Diagnostic Agents]   . Acyclovir And Related   . Compazine [Prochlorperazine Edisylate]   . Erythromycin   . Levaquin [Levofloxacin In D5w]   . Naprosyn [Naproxen]   . Nsaids   . Pristiq [Desvenlafaxine]   . Ultram [Tramadol Hcl]    Current Medications: Current Outpatient Prescriptions  Medication Sig Dispense Refill  . ALPRAZolam (XANAX) 0.25 MG tablet Take 1 tablet (0.25 mg total) by mouth every 8 (eight) hours as needed. just for stormy moments; no alcohol 6 tablet 0  . Cholecalciferol (VITAMIN D-1000 MAX ST) 1000 UNITS tablet Take 1,000 Units by mouth daily.     . Diphenhyd-Hydrocort-Nystatin (FIRST-DUKES MOUTHWASH) SUSP Use as directed 5-10 mLs in the mouth or throat 4 (four) times daily as needed. 237 mL 0  . escitalopram (LEXAPRO) 20 MG tablet Take 1 tablet (20 mg total) by mouth daily. 30 tablet 1  . esomeprazole (NEXIUM) 20 MG capsule Take 20 mg by mouth daily at 12 noon.    Marland Kitchen levonorgestrel (MIRENA) 20 MCG/24HR IUD 1 each by Intrauterine route once.    . Lidocaine, Anorectal, 5 % CREA Apply topically.    . Multiple Vitamin (MULTIVITAMIN) capsule Take 1 capsule by mouth daily.    . predniSONE (DELTASONE) 10 MG tablet Three pills daily x 2 days, then two pills daily x 2 days, then one pill daily x 2 days; take with food 12 tablet 0  . rizatriptan (MAXALT-MLT) 10 MG disintegrating tablet Take 10 mg by mouth as needed for migraine. May repeat in 2 hours if needed    . sucralfate (CARAFATE) 1 G tablet Take 1 g by mouth 4 (four) times daily -  with meals and at bedtime.    . valACYclovir (VALTREX) 1000 MG tablet Two at onset of blisters, then two more pills 12 hours later 4 tablet 3   No current  facility-administered medications for this visit.    Previous Psychotropic Medications: Yes  Patient has been on Pristiq and reports an allergic reaction which she describes as somewhat a dystonic reaction. She also states that she was given some Compazine for migraine headache treatment and it also cause some type of muscle contraction. These are listed on the patient's allergy list. As noted above she is currently on escitalopram and alprazolam.  Substance Abuse History in the last 12 months:  No. Patient states she  does not drink alcohol and relates due to the extensive family history of alcoholism she never was interested. She does smoke cigarettes 1 pack per day for the past 22 years. She denies any use of any illicit drugs. Consequences of Substance Abuse: NA  Medical Decision Making:  New Problem, with no additional work-up planned (3), Review of Medication Regimen & Side Effects (2) and Review of New Medication or Change in Dosage (2)  Treatment Plan Summary: Medication management and Plan Major depressive disorder, severe without psychotic features-patient is artery been started on escitalopram by her primary care physician on 07/22/2015. She reports benefit from this medication. She does report she would like to improve her issues with irritability. I discussed with her the options perhaps adding an augmenting agent to her antidepressant that might provide some mood stability such as one of the atypical antipsychotics. However given that she is already had a response to an increase in the escitalopram in regards to mood and irritability we will try to continue with a single agent and make an additional increase from 15 mg daily to 20 mg daily. Risk and benefits of been discussed patient's able to consent.  In regards to risk assessment the patient does have affective illness and race as risk factors. Protective factors are gender, no prior suicide attempts, engage in treatment, some social  support and response to medication. At this time low risk of imminent harm to self or others.  Patient will follow up in 1 month. She's been encouraged call any questions or concerns prior to her next appointment.    Faith Rogue 11/16/201611:53 AM

## 2015-09-04 NOTE — Patient Instructions (Signed)
Do let me know if your toe gets worse If you are ready to quit smoking, just let me know -- I am here to help you Go easy on the [place] and gravy Return in 3-4 months, but I am here if you need me

## 2015-09-04 NOTE — Assessment & Plan Note (Signed)
Going to see Dr. Marcelline Mates soon for pap

## 2015-09-09 DIAGNOSIS — H6120 Impacted cerumen, unspecified ear: Secondary | ICD-10-CM | POA: Insufficient documentation

## 2015-09-09 DIAGNOSIS — M79674 Pain in right toe(s): Secondary | ICD-10-CM | POA: Insufficient documentation

## 2015-09-09 DIAGNOSIS — T39395A Adverse effect of other nonsteroidal anti-inflammatory drugs [NSAID], initial encounter: Secondary | ICD-10-CM | POA: Insufficient documentation

## 2015-09-09 NOTE — Assessment & Plan Note (Signed)
Suspicious for mild case of gout; discussed possible triggers; ddx also includes pseudogout, prolonged standing, poorly fitting shoes, etc.; she cannot take NSAIDs; return or call if recurs for labs (uric acid)

## 2015-09-09 NOTE — Assessment & Plan Note (Signed)
Patient cannot take NSAIDs; for possible gout on great toe, will NOT prescribe indomethacin or any other NSAID

## 2015-09-09 NOTE — Progress Notes (Signed)
   THERAPIST PROGRESS NOTE  Session Time: 60min  Participation Level: Active  Behavioral Response: CasualAlertDepressed  Type of Therapy: Individual Therapy  Treatment Goals addressed: Coping  Interventions: CBT, Motivational Interviewing, Solution Focused, Strength-based, Supportive, Family Systems and Reframing  Summary: Erika Williamson is a 39 y.o. female who presents with continued symptoms of her diagnosis.  She continues to have difficulty with the relationship that she has with her daughter.  She was tearful while discussing history.  Continues to have short temper while at work.  Wants to build a better relationship with her husband.  Denies social outings or a reduction in symptoms.    Suicidal/Homicidal: Nowithout intent/plan  Therapist Response: LCSW provided Patient with ongoing emotional support and encouragement.  Normalized her feelings.  Commended Patient on her progress and reinforced the importance of client staying focused on her own strengths and resources and resiliency. Processed various strategies for dealing with stressors.    Plan: Return again in 2 weeks.  Diagnosis: Axis I: Major Depression, Recurrent severe    Axis II: No diagnosis    Lubertha South 09/09/2015

## 2015-09-09 NOTE — Assessment & Plan Note (Signed)
So glad she is working with psychiatrist; patient certainly sounds and appears to have improved substantially; supportive listening provided; encouraged her to talk with her counselor and her psychiatrist about the upcoming holiday season and dealing with any stress associated with that (she works in Firefighter at [employer])

## 2015-09-09 NOTE — Assessment & Plan Note (Signed)
She is unfortunately not ready to quit just yet; I am here to help if/when needed

## 2015-09-19 ENCOUNTER — Ambulatory Visit (INDEPENDENT_AMBULATORY_CARE_PROVIDER_SITE_OTHER): Payer: BLUE CROSS/BLUE SHIELD | Admitting: Licensed Clinical Social Worker

## 2015-09-19 DIAGNOSIS — F332 Major depressive disorder, recurrent severe without psychotic features: Secondary | ICD-10-CM | POA: Diagnosis not present

## 2015-09-25 NOTE — Progress Notes (Signed)
   THERAPIST PROGRESS NOTE  Session Time: 57min  Participation Level: Active  Behavioral Response: CasualAlertDepressed  Type of Therapy: Individual Therapy  Treatment Goals addressed: Coping  Interventions: CBT, Motivational Interviewing, Solution Focused, Supportive, Family Systems and Reframing  Summary: Carmindy Rochlin Spofford is a 39 y.o. female who presents with continued symptoms of her diagnosis.  She continues to work fulltime at [employer] and states that she has minimal symptoms while at work due to her ability to stay busy.  She states that at times she gets irritable and frustrated with her co workers but is able to sustain professionalism. Discussion of triggers and coping skills.  Role played how to utilized coping skills.   Suicidal/Homicidal: Nowithout intent/plan  Therapist Response: LCSW provided Patient with ongoing emotional support and encouragement.  Normalized her feelings.  Commended Patient on her progress and reinforced the importance of client staying focused on her own strengths and resources and resiliency. Processed various strategies for dealing with stressors.    Plan: Return again in 2 weeks.  Diagnosis: Axis I: Major Depression, Recurrent severe    Axis II: No diagnosis    Lubertha South 09/25/2015

## 2015-10-03 ENCOUNTER — Ambulatory Visit (INDEPENDENT_AMBULATORY_CARE_PROVIDER_SITE_OTHER): Payer: BLUE CROSS/BLUE SHIELD | Admitting: Licensed Clinical Social Worker

## 2015-10-03 DIAGNOSIS — F332 Major depressive disorder, recurrent severe without psychotic features: Secondary | ICD-10-CM | POA: Diagnosis not present

## 2015-10-04 ENCOUNTER — Ambulatory Visit (INDEPENDENT_AMBULATORY_CARE_PROVIDER_SITE_OTHER): Payer: BLUE CROSS/BLUE SHIELD | Admitting: Psychiatry

## 2015-10-04 ENCOUNTER — Encounter: Payer: Self-pay | Admitting: Psychiatry

## 2015-10-04 VITALS — BP 118/80 | HR 81 | Temp 98.1°F | Ht 63.0 in | Wt 163.0 lb

## 2015-10-04 DIAGNOSIS — F332 Major depressive disorder, recurrent severe without psychotic features: Secondary | ICD-10-CM

## 2015-10-04 DIAGNOSIS — J449 Chronic obstructive pulmonary disease, unspecified: Secondary | ICD-10-CM | POA: Insufficient documentation

## 2015-10-04 MED ORDER — ESCITALOPRAM OXALATE 20 MG PO TABS: 20.0000 mg | ORAL_TABLET | Freq: Every day | ORAL | Status: DC

## 2015-10-04 NOTE — Progress Notes (Signed)
   THERAPIST PROGRESS NOTE  Session Time: 44min  Participation Level: Active  Behavioral Response: Casual and NeatAlertDepressed  Type of Therapy: Individual Therapy  Treatment Goals addressed: Coping  Interventions: CBT, Motivational Interviewing, Solution Focused, Strength-based, Supportive, Family Systems and Reframing  Summary: Erika Williamson is a 39 y.o. female who presents with continued symptoms of her diagnosis.  She was eager to discuss her Thanksgiving with her husband and how she expects her Christmas Day to be.  Discussion of current triggers and her not having any expectations of her daughter being involved with the family with Christmas.  Discussion of supportive persons in her family and discussion of activities that can ease her symptoms.  Reports that attending church and her husband are stress relievers.  Suicidal/Homicidal: Nowithout intent/plan  Therapist Response: LCSW provided Patient with ongoing emotional support and encouragement.  Normalized her feelings.  Commended Patient on her progress and reinforced the importance of client staying focused on her own strengths and resources and resiliency. Processed various strategies for dealing with stressors.    Plan: Return again in 2 weeks.  Diagnosis: Axis I: Major Depression, Recurrent severe    Axis II: No diagnosis    Lubertha South 10/04/2015

## 2015-10-04 NOTE — Progress Notes (Signed)
BH MD/PA/NP OP Progress Note  10/04/2015 12:56 PM Erika Williamson  MRN:  [id number]  Subjective:  Patient returns for follow-up of her major depressive disorder, recurrent severe. She rates that she has been calmer since we increased her Lexapro at her last visit back in November. She continues to feel irritated by others but states she is able to catch herself and not react as harshly. She describes that she is no longer down and depressed but does state that the medication makes her feel a little bit "numb." She is able to state she's no longer suicidal or homicidal and feels that this is progress. She continues to go to work. She states sleep can still be a problem for her and usually states that there is been some stressor during the day which impacts her ability to sleep. We discussed the option of trying to use medication for sleep however she states she would prefer not to do that and she will just prefer to continue to occasionally use Benadryl.   Chief Complaint: numb Chief Complaint    Follow-up; Medication Refill     Visit Diagnosis:     ICD-9-CM ICD-10-CM   1. Severe episode of recurrent major depressive disorder, without psychotic features ([place]) 296.33 F33.2     Past Medical History:  Past Medical History  Diagnosis Date  . Tonsillitis   . Anxiety   . Shingles   . Atypical face pain     Left Sided  . Sinusitis   . Tension headache   . Foot fracture Left X2  . COPD (chronic obstructive pulmonary disease) ([place])   . Tobacco abuse   . Vitamin D deficiency disease   . HPV (human papilloma virus) infection   . High grade squamous intraepithelial cervical dysplasia   . GERD (gastroesophageal reflux disease)     Past Surgical History  Procedure Laterality Date  . Tubal ligation    . Tonsillectomy    . Wisdom tooth extraction    . Colonoscopy with propofol N/A 05/03/2015    Procedure: COLONOSCOPY WITH PROPOFOL;  Surgeon: Josefine Class, MD;  Location: [facility]  ENDOSCOPY;  Service: Endoscopy;  Laterality: N/A;  . Esophagogastroduodenoscopy N/A 05/03/2015    Procedure: ESOPHAGOGASTRODUODENOSCOPY (EGD);  Surgeon: Josefine Class, MD;  Location: [facility] ENDOSCOPY;  Service: Endoscopy;  Laterality: N/A;  . Cesarean section      X2  . Cholecystectomy      Dr Burt Knack   Family History:  Family History  Problem Relation Age of Onset  . Diabetes Mother   . Hypertension Mother   . COPD Mother   . Stroke Mother   . Osteoporosis Mother   . Depression Mother   . Cancer Father 21    colorectal, spread to lung  . Alcohol abuse Father   . Hypertension Father   . Depression Father   . Diabetes Sister   . COPD Sister   . Anxiety disorder Sister   . Depression Sister   . Fibromyalgia Sister   . Asthma Sister   . ADD / ADHD Son   . ADD / ADHD Daughter    Social History:  Social History   Social History  . Marital Status: Married    Spouse Name: N/A  . Number of Children: N/A  . Years of Education: N/A   Social History Main Topics  . Smoking status: Current Every Day Smoker -- 1.00 packs/day for 22 years    Types: Cigarettes    Start date: 09/03/1993  .  Smokeless tobacco: Never Used  . Alcohol Use: No  . Drug Use: No  . Sexual Activity: Yes   Other Topics Concern  . None   Social History Narrative   Additional History:   Assessment:   Musculoskeletal: Strength & Muscle Tone: within normal limits Gait & Station: normal Patient leans: N/A  Psychiatric Specialty Exam: HPI  Review of Systems  Psychiatric/Behavioral: Negative for depression, suicidal ideas, hallucinations, memory loss and substance abuse. The patient has insomnia. The patient is not nervous/anxious.   All other systems reviewed and are negative.   Blood pressure 118/80, pulse 81, temperature 98.1 F (36.7 C), temperature source Tympanic, height 5\' 3"  (1.6 m), weight 163 lb (73.936 kg), last menstrual period 09/29/2015, SpO2 93 %.Body mass index is 28.88 kg/(m^2).   General Appearance: Well Groomed  Eye Contact:  Good  Speech:  Normal Rate  Volume:  Normal  Mood:  Good  Affect:  bright, was even able to laugh when I did discuss my departure  Thought Process:  Linear  Orientation:  Full (Time, Place, and Person)  Thought Content:  Negative  Suicidal Thoughts:  No  Homicidal Thoughts:  No  Memory:  Immediate;   Good Recent;   Good Remote;   Good  Judgement:  Good  Insight:  Good  Psychomotor Activity:  Negative  Concentration:  Good  Recall:  Good  Fund of Knowledge: Good  Language: Good  Akathisia:  Negative  Handed:    AIMS (if indicated):    Assets:  Communication Skills Desire for Improvement Social Support Vocational/Educational  ADL's:  Intact  Cognition: WNL  Sleep:  Fair    Is the patient at risk to self?  No. Has the patient been a risk to self in the past 6 months?  No. Has the patient been a risk to self within the distant past?  No. Is the patient a risk to others?  No. Has the patient been a risk to others in the past 6 months?  No. Has the patient been a risk to others within the distant past?  No.  Current Medications: Current Outpatient Prescriptions  Medication Sig Dispense Refill  . Cholecalciferol (VITAMIN D-1000 MAX ST) 1000 UNITS tablet Take 1,000 Units by mouth daily.     Marland Kitchen escitalopram (LEXAPRO) 20 MG tablet Take 1 tablet (20 mg total) by mouth daily. 30 tablet 4  . esomeprazole (NEXIUM) 20 MG capsule Take 20 mg by mouth daily at 12 noon.    Marland Kitchen levonorgestrel (MIRENA) 20 MCG/24HR IUD 1 each by Intrauterine route once.    . Lidocaine, Anorectal, 5 % CREA Apply topically.    . Multiple Vitamin (MULTIVITAMIN) capsule Take 1 capsule by mouth daily.    . rizatriptan (MAXALT) 10 MG tablet     . rizatriptan (MAXALT-MLT) 10 MG disintegrating tablet Take 10 mg by mouth as needed for migraine. May repeat in 2 hours if needed    . sucralfate (CARAFATE) 1 G tablet Take 1 g by mouth 4 (four) times daily -  with meals  and at bedtime.    . valACYclovir (VALTREX) 1000 MG tablet Two at onset of blisters, then two more pills 12 hours later 4 tablet 3   No current facility-administered medications for this visit.    Medical Decision Making:  Established Problem, Stable/Improving (1) and Review of Medication Regimen & Side Effects (2)  Treatment Plan Summary:Medication management and Plan Major depressive disorder, severe, recurrent without psychotic features. Patient is stable on her Lexapro  20 mg daily. She feels like her mood is better and her irritability is under better control. Thus we will continue her on this regimen. I discussed my upcoming departure in February 2017. Patient follow up with me in 1 month. I explained she will have access to another provider within the [facility] system to continue her care.   Faith Rogue 10/04/2015, 12:56 PM

## 2015-10-18 ENCOUNTER — Ambulatory Visit (INDEPENDENT_AMBULATORY_CARE_PROVIDER_SITE_OTHER): Payer: BLUE CROSS/BLUE SHIELD | Admitting: Licensed Clinical Social Worker

## 2015-10-18 DIAGNOSIS — F332 Major depressive disorder, recurrent severe without psychotic features: Secondary | ICD-10-CM | POA: Diagnosis not present

## 2015-10-31 ENCOUNTER — Ambulatory Visit (INDEPENDENT_AMBULATORY_CARE_PROVIDER_SITE_OTHER): Payer: BLUE CROSS/BLUE SHIELD | Admitting: Licensed Clinical Social Worker

## 2015-10-31 DIAGNOSIS — F332 Major depressive disorder, recurrent severe without psychotic features: Secondary | ICD-10-CM | POA: Diagnosis not present

## 2015-11-01 ENCOUNTER — Ambulatory Visit: Payer: BLUE CROSS/BLUE SHIELD | Admitting: Licensed Clinical Social Worker

## 2015-11-04 ENCOUNTER — Encounter: Payer: Self-pay | Admitting: Psychiatry

## 2015-11-04 ENCOUNTER — Ambulatory Visit (INDEPENDENT_AMBULATORY_CARE_PROVIDER_SITE_OTHER): Payer: BLUE CROSS/BLUE SHIELD | Admitting: Psychiatry

## 2015-11-04 VITALS — BP 120/78 | HR 94 | Temp 98.6°F | Ht 63.0 in | Wt 163.2 lb

## 2015-11-04 DIAGNOSIS — F332 Major depressive disorder, recurrent severe without psychotic features: Secondary | ICD-10-CM

## 2015-11-04 MED ORDER — ESCITALOPRAM OXALATE 20 MG PO TABS: 20.0000 mg | ORAL_TABLET | Freq: Every day | ORAL | Status: DC
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
## 2015-11-04 NOTE — Patient Instructions (Signed)
Can try melatonin 0.3 mg to 0.5 mg at bedtime for sleep.

## 2015-11-07 ENCOUNTER — Ambulatory Visit (INDEPENDENT_AMBULATORY_CARE_PROVIDER_SITE_OTHER): Payer: BLUE CROSS/BLUE SHIELD | Admitting: Obstetrics and Gynecology

## 2015-11-07 ENCOUNTER — Encounter: Payer: Self-pay | Admitting: Obstetrics and Gynecology

## 2015-11-07 VITALS — BP 109/74 | HR 88 | Ht 63.0 in | Wt 162.8 lb

## 2015-11-07 DIAGNOSIS — Z1239 Encounter for other screening for malignant neoplasm of breast: Secondary | ICD-10-CM

## 2015-11-07 DIAGNOSIS — Z01419 Encounter for gynecological examination (general) (routine) without abnormal findings: Secondary | ICD-10-CM

## 2015-11-07 DIAGNOSIS — E663 Overweight: Secondary | ICD-10-CM | POA: Diagnosis not present

## 2015-11-07 DIAGNOSIS — N920 Excessive and frequent menstruation with regular cycle: Secondary | ICD-10-CM

## 2015-11-07 DIAGNOSIS — Z8742 Personal history of other diseases of the female genital tract: Secondary | ICD-10-CM

## 2015-11-07 NOTE — Patient Instructions (Signed)
Preventive Care for Adults, Female A healthy lifestyle and preventive care can promote health and wellness. Preventive health guidelines for women include the following key practices.  A routine yearly physical is a good way to check with your health care provider about your health and preventive screening. It is a chance to share any concerns and updates on your health and to receive a thorough exam.  Visit your dentist for a routine exam and preventive care every 6 months. Brush your teeth twice a day and floss once a day. Good oral hygiene prevents tooth decay and gum disease.  The frequency of eye exams is based on your age, health, family medical history, use of contact lenses, and other factors. Follow your health care provider's recommendations for frequency of eye exams.  Eat a healthy diet. Foods like vegetables, fruits, whole grains, low-fat dairy products, and lean protein foods contain the nutrients you need without too many calories. Decrease your intake of foods high in solid fats, added sugars, and salt. Eat the right amount of calories for you.Get information about a proper diet from your health care provider, if necessary.  Regular physical exercise is one of the most important things you can do for your health. Most adults should get at least 150 minutes of moderate-intensity exercise (any activity that increases your heart rate and causes you to sweat) each week. In addition, most adults need muscle-strengthening exercises on 2 or more days a week.  Maintain a healthy weight. The body mass index (BMI) is a screening tool to identify possible weight problems. It provides an estimate of body fat based on height and weight. Your health care provider can find your BMI and can help you achieve or maintain a healthy weight.For adults 20 years and older:  A BMI below 18.5 is considered underweight.  A BMI of 18.5 to 24.9 is normal.  A BMI of 25 to 29.9 is considered  overweight.  A BMI of 30 and above is considered obese.  Maintain normal blood lipids and cholesterol levels by exercising and minimizing your intake of saturated fat. Eat a balanced diet with plenty of fruit and vegetables. Blood tests for lipids and cholesterol should begin at age 64 and be repeated every 5 years. If your lipid or cholesterol levels are high, you are over 50, or you are at high risk for heart disease, you may need your cholesterol levels checked more frequently.Ongoing high lipid and cholesterol levels should be treated with medicines if diet and exercise are not working.  If you smoke, find out from your health care provider how to quit. If you do not use tobacco, do not start.  Lung cancer screening is recommended for adults aged 52-80 years who are at high risk for developing lung cancer because of a history of smoking. A yearly low-dose CT scan of the lungs is recommended for people who have at least a 30-pack-year history of smoking and are a current smoker or have quit within the past 15 years. A pack year of smoking is smoking an average of 1 pack of cigarettes a day for 1 year (for example: 1 pack a day for 30 years or 2 packs a day for 15 years). Yearly screening should continue until the smoker has stopped smoking for at least 15 years. Yearly screening should be stopped for people who develop a health problem that would prevent them from having lung cancer treatment.  If you are pregnant, do not drink alcohol. If you are  breastfeeding, be very cautious about drinking alcohol. If you are not pregnant and choose to drink alcohol, do not have more than 1 drink per day. One drink is considered to be 12 ounces (355 mL) of beer, 5 ounces (148 mL) of wine, or 1.5 ounces (44 mL) of liquor.  Avoid use of street drugs. Do not share needles with anyone. Ask for help if you need support or instructions about stopping the use of drugs.  High blood pressure causes heart disease and  increases the risk of stroke. Your blood pressure should be checked at least every 1 to 2 years. Ongoing high blood pressure should be treated with medicines if weight loss and exercise do not work.  If you are 25-78 years old, ask your health care provider if you should take aspirin to prevent strokes.  Diabetes screening is done by taking a blood sample to check your blood glucose level after you have not eaten for a certain period of time (fasting). If you are not overweight and you do not have risk factors for diabetes, you should be screened once every 3 years starting at age 86. If you are overweight or obese and you are 3-87 years of age, you should be screened for diabetes every year as part of your cardiovascular risk assessment.  Breast cancer screening is essential preventive care for women. You should practice "breast self-awareness." This means understanding the normal appearance and feel of your breasts and may include breast self-examination. Any changes detected, no matter how small, should be reported to a health care provider. Women in their 66s and 30s should have a clinical breast exam (CBE) by a health care provider as part of a regular health exam every 1 to 3 years. After age 43, women should have a CBE every year. Starting at age 37, women should consider having a mammogram (breast X-ray test) every year. Women who have a family history of breast cancer should talk to their health care provider about genetic screening. Women at a high risk of breast cancer should talk to their health care providers about having an MRI and a mammogram every year.  Breast cancer gene (BRCA)-related cancer risk assessment is recommended for women who have family members with BRCA-related cancers. BRCA-related cancers include breast, ovarian, tubal, and peritoneal cancers. Having family members with these cancers may be associated with an increased risk for harmful changes (mutations) in the breast  cancer genes BRCA1 and BRCA2. Results of the assessment will determine the need for genetic counseling and BRCA1 and BRCA2 testing.  Your health care provider may recommend that you be screened regularly for cancer of the pelvic organs (ovaries, uterus, and vagina). This screening involves a pelvic examination, including checking for microscopic changes to the surface of your cervix (Pap test). You may be encouraged to have this screening done every 3 years, beginning at age 78.  For women ages 79-65, health care providers may recommend pelvic exams and Pap testing every 3 years, or they may recommend the Pap and pelvic exam, combined with testing for human papilloma virus (HPV), every 5 years. Some types of HPV increase your risk of cervical cancer. Testing for HPV may also be done on women of any age with unclear Pap test results.  Other health care providers may not recommend any screening for nonpregnant women who are considered low risk for pelvic cancer and who do not have symptoms. Ask your health care provider if a screening pelvic exam is right for  you.  If you have had past treatment for cervical cancer or a condition that could lead to cancer, you need Pap tests and screening for cancer for at least 20 years after your treatment. If Pap tests have been discontinued, your risk factors (such as having a new sexual partner) need to be reassessed to determine if screening should resume. Some women have medical problems that increase the chance of getting cervical cancer. In these cases, your health care provider may recommend more frequent screening and Pap tests.  Colorectal cancer can be detected and often prevented. Most routine colorectal cancer screening begins at the age of 50 years and continues through age 75 years. However, your health care provider may recommend screening at an earlier age if you have risk factors for colon cancer. On a yearly basis, your health care provider may provide  home test kits to check for hidden blood in the stool. Use of a small camera at the end of a tube, to directly examine the colon (sigmoidoscopy or colonoscopy), can detect the earliest forms of colorectal cancer. Talk to your health care provider about this at age 50, when routine screening begins. Direct exam of the colon should be repeated every 5-10 years through age 75 years, unless early forms of precancerous polyps or small growths are found.  People who are at an increased risk for hepatitis B should be screened for this virus. You are considered at high risk for hepatitis B if:  You were born in a country where hepatitis B occurs often. Talk with your health care provider about which countries are considered high risk.  Your parents were born in a high-risk country and you have not received a shot to protect against hepatitis B (hepatitis B vaccine).  You have HIV or AIDS.  You use needles to inject street drugs.  You live with, or have sex with, someone who has hepatitis B.  You get hemodialysis treatment.  You take certain medicines for conditions like cancer, organ transplantation, and autoimmune conditions.  Hepatitis C blood testing is recommended for all people born from 1945 through 1965 and any individual with known risks for hepatitis C.  Practice safe sex. Use condoms and avoid high-risk sexual practices to reduce the spread of sexually transmitted infections (STIs). STIs include gonorrhea, chlamydia, syphilis, trichomonas, herpes, HPV, and human immunodeficiency virus (HIV). Herpes, HIV, and HPV are viral illnesses that have no cure. They can result in disability, cancer, and death.  You should be screened for sexually transmitted illnesses (STIs) including gonorrhea and chlamydia if:  You are sexually active and are younger than 24 years.  You are older than 24 years and your health care provider tells you that you are at risk for this type of infection.  Your sexual  activity has changed since you were last screened and you are at an increased risk for chlamydia or gonorrhea. Ask your health care provider if you are at risk.  If you are at risk of being infected with HIV, it is recommended that you take a prescription medicine daily to prevent HIV infection. This is called preexposure prophylaxis (PrEP). You are considered at risk if:  You are sexually active and do not regularly use condoms or know the HIV status of your partner(s).  You take drugs by injection.  You are sexually active with a partner who has HIV.  Talk with your health care provider about whether you are at high risk of being infected with HIV. If   you choose to begin PrEP, you should first be tested for HIV. You should then be tested every 3 months for as long as you are taking PrEP.  Osteoporosis is a disease in which the bones lose minerals and strength with aging. This can result in serious bone fractures or breaks. The risk of osteoporosis can be identified using a bone density scan. Women ages 1 years and over and women at risk for fractures or osteoporosis should discuss screening with their health care providers. Ask your health care provider whether you should take a calcium supplement or vitamin D to reduce the rate of osteoporosis.  Menopause can be associated with physical symptoms and risks. Hormone replacement therapy is available to decrease symptoms and risks. You should talk to your health care provider about whether hormone replacement therapy is right for you.  Use sunscreen. Apply sunscreen liberally and repeatedly throughout the day. You should seek shade when your shadow is shorter than you. Protect yourself by wearing long sleeves, pants, a wide-brimmed hat, and sunglasses year round, whenever you are outdoors.  Once a month, do a whole body skin exam, using a mirror to look at the skin on your back. Tell your health care provider of new moles, moles that have irregular  borders, moles that are larger than a pencil eraser, or moles that have changed in shape or color.  Stay current with required vaccines (immunizations).  Influenza vaccine. All adults should be immunized every year.  Tetanus, diphtheria, and acellular pertussis (Td, Tdap) vaccine. Pregnant women should receive 1 dose of Tdap vaccine during each pregnancy. The dose should be obtained regardless of the length of time since the last dose. Immunization is preferred during the 27th-36th week of gestation. An adult who has not previously received Tdap or who does not know her vaccine status should receive 1 dose of Tdap. This initial dose should be followed by tetanus and diphtheria toxoids (Td) booster doses every 10 years. Adults with an unknown or incomplete history of completing a 3-dose immunization series with Td-containing vaccines should begin or complete a primary immunization series including a Tdap dose. Adults should receive a Td booster every 10 years.  Varicella vaccine. An adult without evidence of immunity to varicella should receive 2 doses or a second dose if she has previously received 1 dose. Pregnant females who do not have evidence of immunity should receive the first dose after pregnancy. This first dose should be obtained before leaving the health care facility. The second dose should be obtained 4-8 weeks after the first dose.  Human papillomavirus (HPV) vaccine. Females aged 13-26 years who have not received the vaccine previously should obtain the 3-dose series. The vaccine is not recommended for use in pregnant females. However, pregnancy testing is not needed before receiving a dose. If a female is found to be pregnant after receiving a dose, no treatment is needed. In that case, the remaining doses should be delayed until after the pregnancy. Immunization is recommended for any person with an immunocompromised condition through the age of 24 years if she did not get any or all doses  earlier. During the 3-dose series, the second dose should be obtained 4-8 weeks after the first dose. The third dose should be obtained 24 weeks after the first dose and 16 weeks after the second dose.  Zoster vaccine. One dose is recommended for adults aged 97 years or older unless certain conditions are present.  Measles, mumps, and rubella (MMR) vaccine. Adults born  before 1957 generally are considered immune to measles and mumps. Adults born in [birth year] or later should have 1 or more doses of MMR vaccine unless there is a contraindication to the vaccine or there is laboratory evidence of immunity to each of the three diseases. A routine second dose of MMR vaccine should be obtained at least 28 days after the first dose for students attending postsecondary schools, health care workers, or international travelers. People who received inactivated measles vaccine or an unknown type of measles vaccine during 1963-1967 should receive 2 doses of MMR vaccine. People who received inactivated mumps vaccine or an unknown type of mumps vaccine before 1979 and are at high risk for mumps infection should consider immunization with 2 doses of MMR vaccine. For females of childbearing age, rubella immunity should be determined. If there is no evidence of immunity, females who are not pregnant should be vaccinated. If there is no evidence of immunity, females who are pregnant should delay immunization until after pregnancy. Unvaccinated health care workers born before 60 who lack laboratory evidence of measles, mumps, or rubella immunity or laboratory confirmation of disease should consider measles and mumps immunization with 2 doses of MMR vaccine or rubella immunization with 1 dose of MMR vaccine.  Pneumococcal 13-valent conjugate (PCV13) vaccine. When indicated, a person who is uncertain of his immunization history and has no record of immunization should receive the PCV13 vaccine. All adults 61 years of age and older  should receive this vaccine. An adult aged 92 years or older who has certain medical conditions and has not been previously immunized should receive 1 dose of PCV13 vaccine. This PCV13 should be followed with a dose of pneumococcal polysaccharide (PPSV23) vaccine. Adults who are at high risk for pneumococcal disease should obtain the PPSV23 vaccine at least 8 weeks after the dose of PCV13 vaccine. Adults older than 41 years of age who have normal immune system function should obtain the PPSV23 vaccine dose at least 1 year after the dose of PCV13 vaccine.  Pneumococcal polysaccharide (PPSV23) vaccine. When PCV13 is also indicated, PCV13 should be obtained first. All adults aged 2 years and older should be immunized. An adult younger than age 30 years who has certain medical conditions should be immunized. Any person who resides in a nursing home or long-term care facility should be immunized. An adult smoker should be immunized. People with an immunocompromised condition and certain other conditions should receive both PCV13 and PPSV23 vaccines. People with human immunodeficiency virus (HIV) infection should be immunized as soon as possible after diagnosis. Immunization during chemotherapy or radiation therapy should be avoided. Routine use of PPSV23 vaccine is not recommended for American Indians, [place] Natives, or people younger than 65 years unless there are medical conditions that require PPSV23 vaccine. When indicated, people who have unknown immunization and have no record of immunization should receive PPSV23 vaccine. One-time revaccination 5 years after the first dose of PPSV23 is recommended for people aged 19-64 years who have chronic kidney failure, nephrotic syndrome, asplenia, or immunocompromised conditions. People who received 1-2 doses of PPSV23 before age 44 years should receive another dose of PPSV23 vaccine at age 83 years or later if at least 5 years have passed since the previous dose. Doses  of PPSV23 are not needed for people immunized with PPSV23 at or after age 20 years.  Meningococcal vaccine. Adults with asplenia or persistent complement component deficiencies should receive 2 doses of quadrivalent meningococcal conjugate (MenACWY-D) vaccine. The doses should be obtained  at least 2 months apart. Microbiologists working with certain meningococcal bacteria, [place] recruits, people at risk during an outbreak, and people who travel to or live in countries with a high rate of meningitis should be immunized. A first-year college student up through age 28 years who is living in a residence hall should receive a dose if she did not receive a dose on or after her 16th birthday. Adults who have certain high-risk conditions should receive one or more doses of vaccine.  Hepatitis A vaccine. Adults who wish to be protected from this disease, have certain high-risk conditions, work with hepatitis A-infected animals, work in hepatitis A research labs, or travel to or work in countries with a high rate of hepatitis A should be immunized. Adults who were previously unvaccinated and who anticipate close contact with an international adoptee during the first 60 days after arrival in the [place] from a country with a high rate of hepatitis A should be immunized.  Hepatitis B vaccine. Adults who wish to be protected from this disease, have certain high-risk conditions, may be exposed to blood or other infectious body fluids, are household contacts or sex partners of hepatitis B positive people, are clients or workers in certain care facilities, or travel to or work in countries with a high rate of hepatitis B should be immunized.  Haemophilus influenzae type b (Hib) vaccine. A previously unvaccinated person with asplenia or sickle cell disease or having a scheduled splenectomy should receive 1 dose of Hib vaccine. Regardless of previous immunization, a recipient of a hematopoietic stem cell transplant  should receive a 3-dose series 6-12 months after her successful transplant. Hib vaccine is not recommended for adults with HIV infection. Preventive Services / Frequency Ages 71 to 87 years  Blood pressure check.** / Every 3-5 years.  Lipid and cholesterol check.** / Every 5 years beginning at age 1.  Clinical breast exam.** / Every 3 years for women in their 3s and 31s.  BRCA-related cancer risk assessment.** / For women who have family members with a BRCA-related cancer (breast, ovarian, tubal, or peritoneal cancers).  Pap test.** / Every 2 years from ages 50 through 86. Every 3 years starting at age 87 through age 7 or 75 with a history of 3 consecutive normal Pap tests.  HPV screening.** / Every 3 years from ages 59 through ages 35 to 6 with a history of 3 consecutive normal Pap tests.  Hepatitis C blood test.** / For any individual with known risks for hepatitis C.  Skin self-exam. / Monthly.  Influenza vaccine. / Every year.  Tetanus, diphtheria, and acellular pertussis (Tdap, Td) vaccine.** / Consult your health care provider. Pregnant women should receive 1 dose of Tdap vaccine during each pregnancy. 1 dose of Td every 10 years.  Varicella vaccine.** / Consult your health care provider. Pregnant females who do not have evidence of immunity should receive the first dose after pregnancy.  HPV vaccine. / 3 doses over 6 months, if 72 and younger. The vaccine is not recommended for use in pregnant females. However, pregnancy testing is not needed before receiving a dose.  Measles, mumps, rubella (MMR) vaccine.** / You need at least 1 dose of MMR if you were born in 1957 or later. You may also need a 2nd dose. For females of childbearing age, rubella immunity should be determined. If there is no evidence of immunity, females who are not pregnant should be vaccinated. If there is no evidence of immunity, females who are  pregnant should delay immunization until after  pregnancy.  Pneumococcal 13-valent conjugate (PCV13) vaccine.** / Consult your health care provider.  Pneumococcal polysaccharide (PPSV23) vaccine.** / 1 to 2 doses if you smoke cigarettes or if you have certain conditions.  Meningococcal vaccine.** / 1 dose if you are age 87 to 44 years and a Market researcher living in a residence hall, or have one of several medical conditions, you need to get vaccinated against meningococcal disease. You may also need additional booster doses.  Hepatitis A vaccine.** / Consult your health care provider.  Hepatitis B vaccine.** / Consult your health care provider.  Haemophilus influenzae type b (Hib) vaccine.** / Consult your health care provider. Ages 86 to 38 years  Blood pressure check.** / Every year.  Lipid and cholesterol check.** / Every 5 years beginning at age 49 years.  Lung cancer screening. / Every year if you are aged 71-80 years and have a 30-pack-year history of smoking and currently smoke or have quit within the past 15 years. Yearly screening is stopped once you have quit smoking for at least 15 years or develop a health problem that would prevent you from having lung cancer treatment.  Clinical breast exam.** / Every year after age 51 years.  BRCA-related cancer risk assessment.** / For women who have family members with a BRCA-related cancer (breast, ovarian, tubal, or peritoneal cancers).  Mammogram.** / Every year beginning at age 18 years and continuing for as long as you are in good health. Consult with your health care provider.  Pap test.** / Every 3 years starting at age 63 years through age 37 or 57 years with a history of 3 consecutive normal Pap tests.  HPV screening.** / Every 3 years from ages 41 years through ages 76 to 23 years with a history of 3 consecutive normal Pap tests.  Fecal occult blood test (FOBT) of stool. / Every year beginning at age 36 years and continuing until age 51 years. You may not need  to do this test if you get a colonoscopy every 10 years.  Flexible sigmoidoscopy or colonoscopy.** / Every 5 years for a flexible sigmoidoscopy or every 10 years for a colonoscopy beginning at age 36 years and continuing until age 35 years.  Hepatitis C blood test.** / For all people born from [birth year] through 1966 and any individual with known risks for hepatitis C.  Skin self-exam. / Monthly.  Influenza vaccine. / Every year.  Tetanus, diphtheria, and acellular pertussis (Tdap/Td) vaccine.** / Consult your health care provider. Pregnant women should receive 1 dose of Tdap vaccine during each pregnancy. 1 dose of Td every 10 years.  Varicella vaccine.** / Consult your health care provider. Pregnant females who do not have evidence of immunity should receive the first dose after pregnancy.  Zoster vaccine.** / 1 dose for adults aged 73 years or older.  Measles, mumps, rubella (MMR) vaccine.** / You need at least 1 dose of MMR if you were born in 1957 or later. You may also need a second dose. For females of childbearing age, rubella immunity should be determined. If there is no evidence of immunity, females who are not pregnant should be vaccinated. If there is no evidence of immunity, females who are pregnant should delay immunization until after pregnancy.  Pneumococcal 13-valent conjugate (PCV13) vaccine.** / Consult your health care provider.  Pneumococcal polysaccharide (PPSV23) vaccine.** / 1 to 2 doses if you smoke cigarettes or if you have certain conditions.  Meningococcal vaccine.** /  Consult your health care provider.  Hepatitis A vaccine.** / Consult your health care provider.  Hepatitis B vaccine.** / Consult your health care provider.  Haemophilus influenzae type b (Hib) vaccine.** / Consult your health care provider. Ages 80 years and over  Blood pressure check.** / Every year.  Lipid and cholesterol check.** / Every 5 years beginning at age 62 years.  Lung cancer  screening. / Every year if you are aged 32-80 years and have a 30-pack-year history of smoking and currently smoke or have quit within the past 15 years. Yearly screening is stopped once you have quit smoking for at least 15 years or develop a health problem that would prevent you from having lung cancer treatment.  Clinical breast exam.** / Every year after age 61 years.  BRCA-related cancer risk assessment.** / For women who have family members with a BRCA-related cancer (breast, ovarian, tubal, or peritoneal cancers).  Mammogram.** / Every year beginning at age 39 years and continuing for as long as you are in good health. Consult with your health care provider.  Pap test.** / Every 3 years starting at age 85 years through age 74 or 72 years with 3 consecutive normal Pap tests. Testing can be stopped between 65 and 70 years with 3 consecutive normal Pap tests and no abnormal Pap or HPV tests in the past 10 years.  HPV screening.** / Every 3 years from ages 55 years through ages 67 or 77 years with a history of 3 consecutive normal Pap tests. Testing can be stopped between 65 and 70 years with 3 consecutive normal Pap tests and no abnormal Pap or HPV tests in the past 10 years.  Fecal occult blood test (FOBT) of stool. / Every year beginning at age 81 years and continuing until age 22 years. You may not need to do this test if you get a colonoscopy every 10 years.  Flexible sigmoidoscopy or colonoscopy.** / Every 5 years for a flexible sigmoidoscopy or every 10 years for a colonoscopy beginning at age 67 years and continuing until age 22 years.  Hepatitis C blood test.** / For all people born from [birth year] through 1966 and any individual with known risks for hepatitis C.  Osteoporosis screening.** / A one-time screening for women ages 8 years and over and women at risk for fractures or osteoporosis.  Skin self-exam. / Monthly.  Influenza vaccine. / Every year.  Tetanus, diphtheria, and  acellular pertussis (Tdap/Td) vaccine.** / 1 dose of Td every 10 years.  Varicella vaccine.** / Consult your health care provider.  Zoster vaccine.** / 1 dose for adults aged 56 years or older.  Pneumococcal 13-valent conjugate (PCV13) vaccine.** / Consult your health care provider.  Pneumococcal polysaccharide (PPSV23) vaccine.** / 1 dose for all adults aged 15 years and older.  Meningococcal vaccine.** / Consult your health care provider.  Hepatitis A vaccine.** / Consult your health care provider.  Hepatitis B vaccine.** / Consult your health care provider.  Haemophilus influenzae type b (Hib) vaccine.** / Consult your health care provider. ** Family history and personal history of risk and conditions may change your health care provider's recommendations.   This information is not intended to replace advice given to you by your health care provider. Make sure you discuss any questions you have with your health care provider.   Document Released: 12/01/2001 Document Revised: 10/26/2014 Document Reviewed: 03/02/2011 Elsevier Interactive Patient Education [employer].

## 2015-11-07 NOTE — Progress Notes (Signed)
GYNECOLOGY ANNUAL EXAM CLINIC PROGRESS NOTE  Subjective:     Israh Helle Bobby is a 40 y.o. [id number] female here for a routine exam.  Current complaints: none.  Personal health questionnaire reviewed: yes.  The patient is sexually active (however has not been in the past 4 months).The patient wears seatbelts: yes. The patient participates in regular exercise: no. Has the patient ever been transfused or tattooed?: no. The patient reports that there is not domestic violence in her life.     Gynecologic History Patient's last menstrual period was 10/28/2015. Contraception: IUD (Mirena) Last Pap: 10/16/2014. Results were: normal.  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  Last mammogram: The patient has never had a mammogram.   Obstetric History OB History  Gravida Para Term Preterm AB SAB TAB Ectopic Multiple Living  2 2 2            # Outcome Date GA Lbr Len/2nd Weight Sex Delivery Anes PTL Lv  2 Term      CS-LTranv     1 Term      CS-LTranv       Obstetric Comments  1st Menstrual Cycle:  10  1st Pregnancy:  37     Past Medical History  Diagnosis Date  . Tonsillitis   . Anxiety   . Shingles   . Atypical face pain     Left Sided  . Sinusitis   . Tension headache   . Foot fracture Left X2  . COPD (chronic obstructive pulmonary disease) ([place])   . Tobacco abuse   . Vitamin D deficiency disease   . HPV (human papilloma virus) infection   . GERD (gastroesophageal reflux disease)   . 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 Depression     Family History  Problem Relation Age of Onset  . Diabetes Mother   . Hypertension Mother   . COPD Mother   . Stroke Mother   . Osteoporosis Mother   . Depression Mother   . Cancer Father 93    colorectal, spread to lung  . Alcohol abuse Father   . Hypertension Father   . Depression Father   . Diabetes Sister   . COPD Sister   .  Anxiety disorder Sister   . Depression Sister   . Fibromyalgia Sister   . Asthma Sister   . ADD / ADHD Son   . ADD / ADHD Daughter     Past Surgical History  Procedure Laterality Date  . Tubal ligation    . Tonsillectomy    . Wisdom tooth extraction    . Colonoscopy with propofol N/A 05/03/2015    Procedure: COLONOSCOPY WITH PROPOFOL;  Surgeon: Josefine Class, MD;  Location: [facility] ENDOSCOPY;  Service: Endoscopy;  Laterality: N/A;  . Esophagogastroduodenoscopy N/A 05/03/2015    Procedure: ESOPHAGOGASTRODUODENOSCOPY (EGD);  Surgeon: Josefine Class, MD;  Location: [facility] ENDOSCOPY;  Service: Endoscopy;  Laterality: N/A;  . Cesarean section      X2  . Cholecystectomy      Dr Burt Knack  . [place]  10/05/2013    h/o CIN II    Social History   Social History  . Marital Status: Married    Spouse Name: N/A  . Number of Children: N/A  . Years of Education: N/A   Occupational History  . Not on file.   Social History Main Topics  .  Smoking status: Current Every Day Smoker -- 1.00 packs/day for 22 years    Types: Cigarettes    Start date: 09/03/1993  . Smokeless tobacco: Never Used  . Alcohol Use: No  . Drug Use: No  . Sexual Activity: Yes   Other Topics Concern  . Not on file   Social History Narrative    Allergies  Allergen Reactions  . Ivp Dye [Iodinated Diagnostic Agents]   . Acyclovir And Related   . Compazine [Prochlorperazine Edisylate]   . Erythromycin   . Levaquin [Levofloxacin In D5w]   . Levofloxacin Other (See Comments)  . Naprosyn [Naproxen]   . Nsaids   . Pristiq [Desvenlafaxine]   . Tramadol Other (See Comments)  . Ultram [Tramadol Hcl]     Current Outpatient Prescriptions on File Prior to Visit  Medication Sig Dispense Refill  . Cholecalciferol (VITAMIN D-1000 MAX ST) 1000 UNITS tablet Take 1,000 Units by mouth daily.     Marland Kitchen escitalopram (LEXAPRO) 20 MG tablet Take 1 tablet (20 mg total) by mouth daily. 30 tablet 4  . esomeprazole (NEXIUM) 20 MG  capsule Take 20 mg by mouth daily at 12 noon.    Marland Kitchen levonorgestrel (MIRENA) 20 MCG/24HR IUD 1 each by Intrauterine route once.    . Lidocaine, Anorectal, 5 % CREA Apply topically.    . Multiple Vitamin (MULTIVITAMIN) capsule Take 1 capsule by mouth daily.    . rizatriptan (MAXALT) 10 MG tablet     . rizatriptan (MAXALT-MLT) 10 MG disintegrating tablet Take 10 mg by mouth as needed for migraine. May repeat in 2 hours if needed    . sucralfate (CARAFATE) 1 G tablet Take 1 g by mouth 4 (four) times daily -  with meals and at bedtime.    . valACYclovir (VALTREX) 1000 MG tablet Two at onset of blisters, then two more pills 12 hours later 4 tablet 3   No current facility-administered medications on file prior to visit.     Review of Systems Constitutional: negative for chills, fatigue, fevers and sweats Eyes: negative for irritation, redness and visual disturbance Ears, nose, mouth, throat, and face: negative for hearing loss, nasal congestion, snoring and tinnitus Respiratory: negative for asthma, cough, sputum Cardiovascular: negative for chest pain, dyspnea, exertional chest pressure/discomfort, irregular heart beat, palpitations and syncope Gastrointestinal: negative for abdominal pain, change in bowel habits, nausea and vomiting Genitourinary: positive for abnormal menstrual periods (short cycles, occur q 21 days, last 7-10 days), negative for genital lesions, sexual problems and vaginal discharge, dysuria and urinary incontinence Integument/breast: negative for breast lump, breast tenderness and nipple discharge Hematologic/lymphatic: negative for bleeding and easy bruising Musculoskeletal:negative for back pain and muscle weakness Neurological: positive for headaches (migraines, controlled with meds)negative for dizziness, vertigo and weakness Endocrine: negative for diabetic symptoms including polydipsia, polyuria and skin dryness Allergic/Immunologic: negative for hay fever and urticaria     Objective:    BP 109/74 mmHg  Pulse 88  Ht 5\' 3"  (1.6 m)  Wt 162 lb 12.8 oz (73.846 kg)  BMI 28.85 kg/m2  LMP 10/28/2015     General Appearance:    Alert, cooperative, no distress, appears stated age  Head:    Normocephalic, without obvious abnormality, atraumatic  Eyes:    PERRL, conjunctiva/corneas clear, EOM's intact, both eyes  Ears:    Normal external ear canals, both ears  Nose:   Nares normal, septum midline, mucosa normal, no drainage or sinus tenderness  Throat:   Lips, mucosa, and tongue normal; teeth and  gums normal  Neck:   Supple, symmetrical, trachea midline, no adenopathy; thyroid: no enlargement/tenderness/nodules; no carotid bruit or JVD  Back:     Symmetric, no curvature, ROM normal, no CVA tenderness  Lungs:     Clear to auscultation bilaterally, respirations unlabored  Chest Wall:    No tenderness or deformity   Heart:    Regular rate and rhythm, S1 and S2 normal, no murmur, rub or gallop  Breast Exam:    No tenderness, masses, or nipple abnormality  Abdomen:     Soft, non-tender, bowel sounds active all four quadrants, no masses, no organomegaly.    Genitalia:    Pelvic:external genitalia normal, vagina without lesions, discharge, or tenderness, rectovaginal septum  normal. Cervix normal in appearance, no cervical motion tenderness, no adnexal masses or tenderness.  Uterus normal size, shape, consistency, nontender and mobile.    Rectal:    Normal external sphincter.  No hemorrhoids appreciated. Internal exam not done.   Extremities:   Extremities normal, atraumatic, no cyanosis or edema  Pulses:   2+ and symmetric all extremities  Skin:   Skin color, texture, turgor normal, no rashes or lesions  Lymph nodes:   Cervical, supraclavicular, and axillary nodes normal  Neurologic:   CNII-XII intact, normal strength, sensation and reflexes throughout     Assessment:   Healthy female exam.   H/o cervical dysplasia (CIN II) Polymenorrhea Overweight   Plan:    Pap smear performed. Needs 3 consecutive negative paps before returning to routine screening q 3-5 years   Education reviewed: low fat, low cholesterol diet, self breast exams, smoking cessation and weight bearing exercise. Contraception: IUD.  Discussed possible removal if bothersome symptoms (i.e. polymenorrhea, lengthy cycles).  Patient declines at this time Mammogram ordered. Follow up in: 1 year.      Rubie Maid, MD [facility]
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
## 2015-11-10 ENCOUNTER — Encounter: Payer: Self-pay | Admitting: Obstetrics and Gynecology

## 2015-11-15 ENCOUNTER — Telehealth: Payer: Self-pay | Admitting: Obstetrics and Gynecology

## 2015-11-15 ENCOUNTER — Ambulatory Visit (INDEPENDENT_AMBULATORY_CARE_PROVIDER_SITE_OTHER): Payer: BLUE CROSS/BLUE SHIELD | Admitting: Licensed Clinical Social Worker

## 2015-11-15 DIAGNOSIS — F332 Major depressive disorder, recurrent severe without psychotic features: Secondary | ICD-10-CM | POA: Diagnosis not present

## 2015-11-15 LAB — PAP IG AND HPV HIGH-RISK
HPV, high-risk: NEGATIVE
PAP Smear Comment: 0

## 2015-11-15 NOTE — Telephone Encounter (Signed)
LM for pt informing her of negative results.  

## 2015-11-15 NOTE — Telephone Encounter (Signed)
PT CALLED AND SHE WAS HERE LAST WEEK FOR HER AE AND SHE WAS WANTING TO KNOW THE RESULTS OF HER TEST IF THEY WERE BACK.
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
## 2015-11-25 ENCOUNTER — Ambulatory Visit: Payer: BLUE CROSS/BLUE SHIELD | Admitting: Psychiatry

## 2015-11-26 ENCOUNTER — Encounter: Payer: Self-pay | Admitting: Family Medicine

## 2015-11-26 ENCOUNTER — Ambulatory Visit (INDEPENDENT_AMBULATORY_CARE_PROVIDER_SITE_OTHER): Payer: BLUE CROSS/BLUE SHIELD | Admitting: Family Medicine

## 2015-11-26 VITALS — BP 111/72 | HR 74 | Temp 98.8°F | Ht 63.2 in | Wt 165.0 lb

## 2015-11-26 DIAGNOSIS — Z8669 Personal history of other diseases of the nervous system and sense organs: Secondary | ICD-10-CM

## 2015-11-26 DIAGNOSIS — J0140 Acute pansinusitis, unspecified: Secondary | ICD-10-CM

## 2015-11-26 DIAGNOSIS — J329 Chronic sinusitis, unspecified: Secondary | ICD-10-CM | POA: Insufficient documentation

## 2015-11-26 MED ORDER — AMOXICILLIN-POT CLAVULANATE 875-125 MG PO TABS: 1.0000 | ORAL_TABLET | Freq: Two times a day (BID) | ORAL | Status: DC

## 2015-11-26 NOTE — Assessment & Plan Note (Signed)
Discussed sinusitis care and treatment Nasal rinses Tylenol sinus Tylenol Use of antibiotics and ways to minimize side effects Discuss headache care

## 2015-11-26 NOTE — Progress Notes (Signed)
BP 111/72 mmHg  Pulse 74  Temp(Src) 98.8 F (37.1 C)  Ht 5' 3.2" (1.605 m)  Wt 165 lb (74.844 kg)  BMI 29.05 kg/m2  SpO2 99%  LMP 11/21/2015 (Exact Date)   Subjective:    Patient ID: Erika Williamson, female    DOB: 07/22/1976, 40 y.o.   MRN: [id number]  HPI: KRISLYN FRANKENFIELD is a 40 y.o. female  Chief Complaint  Patient presents with  . URI  . Diarrhea   patient with a month of sinus congestion and drainage headache more frontal than on the right Patient's long-term headache patient's these are different than her usual migraine headaches and has tried migraine headache with no success has sore throat for the much aching all over with occasional systemic symptoms of fever Has increasing pressure with bending over sloshing-type sensation Blows out of her head mucus and blood.  Relevant past medical, surgical, family and social history reviewed and updated as indicated. Interim medical history since our last visit reviewed. Allergies and medications reviewed and updated.  Review of Systems  Constitutional: Positive for fever, chills, diaphoresis and fatigue.  HENT: Positive for congestion, ear pain, postnasal drip, rhinorrhea, sinus pressure, sneezing and sore throat. Negative for hearing loss, mouth sores and tinnitus.   Respiratory: Positive for cough.   Cardiovascular: Negative.     Per HPI unless specifically indicated above     Objective:    BP 111/72 mmHg  Pulse 74  Temp(Src) 98.8 F (37.1 C)  Ht 5' 3.2" (1.605 m)  Wt 165 lb (74.844 kg)  BMI 29.05 kg/m2  SpO2 99%  LMP 11/21/2015 (Exact Date)  Wt Readings from Last 3 Encounters:  11/26/15 165 lb (74.844 kg)  11/07/15 162 lb 12.8 oz (73.846 kg)  11/04/15 163 lb 3.2 oz (74.027 kg)    Physical Exam  Constitutional: She is oriented to person, place, and time. She appears well-developed and well-nourished. No distress.  HENT:  Head: Normocephalic and atraumatic.  Right Ear: Hearing and external ear  normal.  Left Ear: Hearing and external ear normal.  Nose: Nose normal.  Mouth/Throat: Oropharyngeal exudate present.  Eyes: Conjunctivae and lids are normal. Right eye exhibits no discharge. Left eye exhibits no discharge. No scleral icterus.  Neck: No thyromegaly present.  Cardiovascular: Normal rate, regular rhythm and normal heart sounds.   Pulmonary/Chest: Effort normal and breath sounds normal. No respiratory distress.  Musculoskeletal: Normal range of motion.  Lymphadenopathy:    She has no cervical adenopathy.  Neurological: She is alert and oriented to person, place, and time.  Skin: Skin is intact. No rash noted.  Psychiatric: She has a normal mood and affect. Her speech is normal and behavior is normal. Judgment and thought content normal. Cognition and memory are normal.    Results for orders placed or performed in visit on 11/07/15  Pap IG and HPV (high risk) DNA detection  Result Value Ref Range   DIAGNOSIS: Comment    Specimen adequacy: Comment    CLINICIAN PROVIDED ICD10: Comment    Performed by: Comment    PAP SMEAR COMMENT .    Note: Comment    Test Methodology Comment    HPV, high-risk Negative Negative      Assessment & Plan:   Problem List Items Addressed This Visit      Respiratory   Sinusitis - Primary    Discussed sinusitis care and treatment Nasal rinses Tylenol sinus Tylenol Use of antibiotics and ways to minimize side effects Discuss  headache care      Relevant Medications   amoxicillin-clavulanate (AUGMENTIN) 875-125 MG tablet     Other   Hx of migraine headaches    Management currently stable          Follow up plan: Return for As scheduled.

## 2015-11-26 NOTE — Assessment & Plan Note (Signed)
Management currently stable

## 2015-11-29 ENCOUNTER — Ambulatory Visit (INDEPENDENT_AMBULATORY_CARE_PROVIDER_SITE_OTHER): Payer: BLUE CROSS/BLUE SHIELD | Admitting: Licensed Clinical Social Worker

## 2015-11-29 DIAGNOSIS — F332 Major depressive disorder, recurrent severe without psychotic features: Secondary | ICD-10-CM

## 2015-12-13 ENCOUNTER — Ambulatory Visit (INDEPENDENT_AMBULATORY_CARE_PROVIDER_SITE_OTHER): Payer: BLUE CROSS/BLUE SHIELD | Admitting: Licensed Clinical Social Worker

## 2015-12-13 DIAGNOSIS — F332 Major depressive disorder, recurrent severe without psychotic features: Secondary | ICD-10-CM

## 2015-12-17 NOTE — Progress Notes (Signed)
   THERAPIST PROGRESS NOTE  Session Time: 29min  Participation Level: Active  Behavioral Response: NeatAlertDepressed  Type of Therapy: Individual Therapy  Treatment Goals addressed: Coping and Diagnosis: Depression  Interventions: CBT, Motivational Interviewing, Solution Focused, Supportive, Family Systems and Reframing  Summary: Piera Balam Menna is a 40 y.o. female who presents with continued symptoms of her diagnosis.  She reports that she has been stressed out which triggers her negative emotions.  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  She denies a sleep schedule and also denies the ability to obtain restful sleep.  She reports a positive relationship and positive communication with her children and her husband.   Discussion of Therapist going on [place] and provided patient with a list of therapist in the area..   Suicidal/Homicidal: Nowithout intent/plan  Therapist Response: LCSW provided Patient with ongoing emotional support and encouragement.  Normalized her feelings.  Commended Patient on her progress and reinforced the importance of client staying focused on her own strengths and resources and resiliency. Processed various strategies for dealing with stressors.    Plan:  Discussion of Therapist going on [place] and provided patient with a list of therapist in the area.  Diagnosis: Axis I: Major Depression, Recurrent severe    Axis II: No diagnosis    Lubertha South 12/13/2015

## 2015-12-17 NOTE — Progress Notes (Signed)
   THERAPIST PROGRESS NOTE  Session Time: 72min  Participation Level: Active  Behavioral Response: NeatAlertDepressed  Type of Therapy: Individual Therapy  Treatment Goals addressed: Coping  Interventions: CBT, Motivational Interviewing, Solution Focused, Supportive, Family Systems and Reframing  Summary: Erika Williamson is a 40 y.o. female who presents with continued symptoms of her diagnosis.  She was able to list her current stressors and how she continues to manage.  She reports that she and her daughter have been building a relationship and she is excited to talk to her weekly.  She reports that her husband purchased a new car and is trying to communicate more with her.  She was able express how she will continue to manage with medication and therapy.   Suicidal/Homicidal: Nowithout intent/plan  Therapist Response: LCSW provided Patient with ongoing emotional support and encouragement.  Normalized her feelings.  Commended Patient on her progress and reinforced the importance of client staying focused on her own strengths and resources and resiliency. Processed various strategies for dealing with stressors.    Plan: Return again in2 weeks.  Diagnosis: Axis I: Major Depression, Recurrent severe    Axis II: No diagnosis    Lubertha South 11/29/2015

## 2016-01-23 ENCOUNTER — Ambulatory Visit (INDEPENDENT_AMBULATORY_CARE_PROVIDER_SITE_OTHER): Payer: BLUE CROSS/BLUE SHIELD | Admitting: Psychiatry

## 2016-01-23 ENCOUNTER — Encounter: Payer: Self-pay | Admitting: Psychiatry

## 2016-01-23 ENCOUNTER — Ambulatory Visit: Payer: BLUE CROSS/BLUE SHIELD | Admitting: Psychiatry

## 2016-01-23 VITALS — BP 122/86 | HR 94 | Temp 99.4°F | Ht 63.2 in | Wt 172.6 lb

## 2016-01-23 DIAGNOSIS — F331 Major depressive disorder, recurrent, moderate: Secondary | ICD-10-CM

## 2016-01-23 MED ORDER — PROPRANOLOL HCL 10 MG PO TABS: 10.0000 mg | ORAL_TABLET | Freq: Two times a day (BID) | ORAL | Status: DC

## 2016-01-23 MED ORDER — ESCITALOPRAM OXALATE 20 MG PO TABS: 20.0000 mg | ORAL_TABLET | Freq: Every day | ORAL | Status: DC

## 2016-01-23 NOTE — Progress Notes (Signed)
BH MD/PA/NP OP Progress Note  01/23/2016 4:02 PM Erika Williamson  MRN:  [id number]  Subjective:  Patient is a 40 year old female who presented for follow-up. She has history of major depression and was following Dr. Jimmye Norman. She reported that she has been doing well on her Lexapro. She currently works in [employer]. She reported that she is currently experiencing worsening of her migraine headaches and was planning to follow-up with Dr. Manuella Ghazi. She is on Maxalt but it is not helpful. Patient reported that she wants to have her medications adjusted. She currently denied having any mood swings anger anxiety or paranoia. She reported that she sleeps well at night.  She stated that she sleeps  long hours at night and she does not have any problems with sleep. She drinks [facility] throughout the day. She reported that she does not want to stop consuming soda.. Patient reported that she has been gaining weight and she wants to control her weight at this time. She wants to have her medications adjusted at her next appointment.  She was friendly and cooperative during the interview Chief Complaint:  Chief Complaint    Follow-up; Medication Refill     Visit Diagnosis:     ICD-9-CM ICD-10-CM   1. MDD (major depressive disorder), recurrent episode, moderate ([place]) 296.32 F33.1     Past Medical History:  Past Medical History  Diagnosis Date  . Tonsillitis   . Anxiety   . Shingles   . Atypical face pain     Left Sided  . Sinusitis   . Tension headache   . Foot fracture Left X2  . COPD (chronic obstructive pulmonary disease) ([place])   . Tobacco abuse   . Vitamin D deficiency disease   . HPV (human papilloma virus) infection   . GERD (gastroesophageal reflux disease)   . 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 Depression     Past Surgical History  Procedure Laterality Date  . Tubal ligation    . Tonsillectomy    . Wisdom tooth  extraction    . Colonoscopy with propofol N/A 05/03/2015    Procedure: COLONOSCOPY WITH PROPOFOL;  Surgeon: Josefine Class, MD;  Location: [facility] ENDOSCOPY;  Service: Endoscopy;  Laterality: N/A;  . Esophagogastroduodenoscopy N/A 05/03/2015    Procedure: ESOPHAGOGASTRODUODENOSCOPY (EGD);  Surgeon: Josefine Class, MD;  Location: [facility] ENDOSCOPY;  Service: Endoscopy;  Laterality: N/A;  . Cesarean section      X2  . Cholecystectomy      Dr Burt Knack  . 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 Diabetes Mother   . Hypertension Mother   . COPD Mother   . Stroke Mother   . Osteoporosis Mother   . Depression Mother   . Cancer Father 41    colorectal, spread to lung  . Alcohol abuse Father   . Hypertension Father   . Depression Father   . Diabetes Sister   . COPD Sister   . Anxiety disorder Sister   . Depression Sister   . Fibromyalgia Sister   . Asthma Sister   . ADD / ADHD Son   . ADD / ADHD Daughter    Social History:  Social History   Social History  . Marital Status: Married    Spouse Name: N/A  . Number of Children: N/A  . Years of  Education: N/A   Social History Main Topics  . Smoking status: Current Every Day Smoker -- 1.00 packs/day for 22 years    Types: Cigarettes    Start date: 09/03/1993  . Smokeless tobacco: Never Used  . Alcohol Use: No  . Drug Use: No  . Sexual Activity: Yes   Other Topics Concern  . None   Social History Narrative   Additional History:   Assessment:   Musculoskeletal: Strength & Muscle Tone: within normal limits Gait & Station: normal Patient leans: N/A  Psychiatric Specialty Exam: HPI  Review of Systems  Psychiatric/Behavioral: Negative for depression, suicidal ideas, hallucinations, memory loss and substance abuse. The patient has insomnia. The patient is not nervous/anxious.   All other systems reviewed and are negative.   Blood pressure 122/86, pulse 94,  temperature 99.4 F (37.4 C), temperature source Tympanic, height 5' 3.2" (1.605 m), weight 172 lb 9.6 oz (78.291 kg), last menstrual period 01/06/2015, SpO2 91 %.Body mass index is 30.39 kg/(m^2).  General Appearance: Well Groomed  Eye Contact:  Good  Speech:  Normal Rate  Volume:  Normal  Mood:  Good  Affect:  full range  Thought Process:  Linear  Orientation:  Full (Time, Place, and Person)  Thought Content:  Negative  Suicidal Thoughts:  No  Homicidal Thoughts:  No  Memory:  Immediate;   Good Recent;   Good Remote;   Good  Judgement:  Good  Insight:  Good  Psychomotor Activity:  Negative  Concentration:  Good  Recall:  Good  Fund of Knowledge: Good  Language: Good  Akathisia:  Negative  Handed:    AIMS (if indicated):    Assets:  Communication Skills Desire for Improvement Social Support Vocational/Educational  ADL's:  Intact  Cognition: WNL  Sleep:  Fair    Is the patient at risk to self?  No. Has the patient been a risk to self in the past 6 months?  No. Has the patient been a risk to self within the distant past?  No. Is the patient a risk to others?  No. Has the patient been a risk to others in the past 6 months?  No. Has the patient been a risk to others within the distant past?  No.  Current Medications: Current Outpatient Prescriptions  Medication Sig Dispense Refill  . amoxicillin-clavulanate (AUGMENTIN) 875-125 MG tablet Take 1 tablet by mouth 2 (two) times daily. 20 tablet 0  . Cholecalciferol (VITAMIN D-1000 MAX ST) 1000 UNITS tablet Take 1,000 Units by mouth daily.     Marland Kitchen escitalopram (LEXAPRO) 20 MG tablet Take 1 tablet (20 mg total) by mouth daily. 30 tablet 4  . esomeprazole (NEXIUM) 20 MG capsule Take 20 mg by mouth daily at 12 noon.    Marland Kitchen levonorgestrel (MIRENA) 20 MCG/24HR IUD 1 each by Intrauterine route once.    . Lidocaine, Anorectal, 5 % CREA Apply topically.    . Multiple Vitamin (MULTIVITAMIN) capsule Take 1 capsule by mouth daily.    .  rizatriptan (MAXALT) 10 MG tablet     . sucralfate (CARAFATE) 1 G tablet Take 1 g by mouth 4 (four) times daily -  with meals and at bedtime.    . valACYclovir (VALTREX) 1000 MG tablet Two at onset of blisters, then two more pills 12 hours later 4 tablet 3   No current facility-administered medications for this visit.    Medical Decision Making:  Established Problem, Stable/Improving (1) and Review of Medication Regimen & Side Effects (2)  Treatment Plan Summary:  Discussed with patient what the medications. She will continue on Lexapro 20 mg daily. I also started her on propranolol 10-20 mg daily for her migraine headaches. Discussed with her about the side effects of the medication detail and she demonstrated understanding. She will follow-up in one month or earlier depending on her symptoms   More than 50% of the time spent in psychoeducation, counseling and coordination of care.    This note was generated in part or whole with voice recognition software. Voice regonition is usually quite accurate but there are transcription errors that can and very often do occur. I apologize for any typographical errors that were not detected and corrected.   Rainey Pines, MD   01/23/2016, 4:02 PM

## 2016-02-20 ENCOUNTER — Ambulatory Visit (INDEPENDENT_AMBULATORY_CARE_PROVIDER_SITE_OTHER): Payer: BLUE CROSS/BLUE SHIELD | Admitting: Psychiatry

## 2016-02-20 ENCOUNTER — Encounter: Payer: Self-pay | Admitting: Psychiatry

## 2016-02-20 VITALS — BP 120/82 | HR 79 | Temp 98.2°F | Ht 63.5 in | Wt 176.6 lb

## 2016-02-20 DIAGNOSIS — F332 Major depressive disorder, recurrent severe without psychotic features: Secondary | ICD-10-CM

## 2016-02-20 NOTE — Progress Notes (Signed)
BH MD/PA/NP OP Progress Note  02/20/2016 4:40 PM CLARE HOUDESHELL  MRN:  [id number]  Subjective:  Patient is a 40 year old female who presented for follow-up. She has history of major depression. Patient reported that she has been feeling very depressed and was thinking about hurting herself last night. She told her husband that he needs to remove the propranolol from her home which she has not even started taking. Patient reported that her son had 8 you kittens in his home but one of them died and she was taking care of them. She reported that it caused her worsening of depression as she was very much close to the kitten. She reported that after that incident the water pipe also broke in her son's home. It has not been fixed yet. She feels overwhelmed with all the problems and issues in their family. She has also been working 12 hour shifts at [employer]. She reported that she wants to get away from all these problems. Patient stated that she has not been sleeping well and was not able to take shower for the past 2 days.  Patient currently denied having any suicidal ideations or plans. She reported that she feels well when she is by herself and nobody is bothering her. She contracted for safety at this time.  She also agreed with the plan to go to the IOP in [place] she reported that her husband is very supportive. She denied having any perceptual disturbances at this time. She smells strongly of cigarettes.  She stated that she continues to have headaches on a daily basis. She reported that she has been doing well on her Lexapro. She currently works in [employer]. She reported that she is currently experiencing worsening of her migraine headaches and was planning to follow-up with Dr. Manuella Ghazi. She is on Maxalt but it is not helpful. Patient reported that she wants to have her medications adjusted. Chief Complaint:  Chief Complaint    Medication Refill; Patient Education; Depression; Anxiety     Visit  Diagnosis:     ICD-9-CM ICD-10-CM   1. Severe episode of recurrent major depressive disorder, without psychotic features ([place]) 296.33 F33.2     Past Medical History:  Past Medical History  Diagnosis Date  . Tonsillitis   . Anxiety   . Shingles   . Atypical face pain     Left Sided  . Sinusitis   . Tension headache   . Foot fracture Left X2  . COPD (chronic obstructive pulmonary disease) ([place])   . Tobacco abuse   . Vitamin D deficiency disease   . HPV (human papilloma virus) infection   . GERD (gastroesophageal reflux disease)   . 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 Depression     Past Surgical History  Procedure Laterality Date  . Tubal ligation    . Tonsillectomy    . Wisdom tooth extraction    . Colonoscopy with propofol N/A 05/03/2015    Procedure: COLONOSCOPY WITH PROPOFOL;  Surgeon: Josefine Class, MD;  Location: [facility] ENDOSCOPY;  Service: Endoscopy;  Laterality: N/A;  . Esophagogastroduodenoscopy N/A 05/03/2015    Procedure: ESOPHAGOGASTRODUODENOSCOPY (EGD);  Surgeon: Josefine Class, MD;  Location: [facility] ENDOSCOPY;  Service: Endoscopy;  Laterality: N/A;  . Cesarean section      X2  . Cholecystectomy      Dr Burt Knack  . 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 Diabetes Mother   . Hypertension Mother   . COPD Mother   . Stroke Mother   . Osteoporosis Mother   . Depression Mother   . Cancer Father 52    colorectal, spread to lung  . Alcohol abuse Father   . Hypertension Father   . Depression Father   . Diabetes Sister   . COPD Sister   . Anxiety disorder Sister   . Depression Sister   . Fibromyalgia Sister   . Asthma Sister   . ADD / ADHD Son   . ADD / ADHD Daughter    Social History:  Social History   Social History  . Marital Status: Married    Spouse Name: N/A  . Number of Children: N/A  . Years of Education: N/A    Social History Main Topics  . Smoking status: Current Every Day Smoker -- 1.00 packs/day for 22 years    Types: Cigarettes    Start date: 09/03/1993  . Smokeless tobacco: Never Used  . Alcohol Use: No  . Drug Use: No  . Sexual Activity: Yes   Other Topics Concern  . None   Social History Narrative   Additional History:   Assessment:   Musculoskeletal: Strength & Muscle Tone: within normal limits Gait & Station: normal Patient leans: N/A  Psychiatric Specialty Exam: HPI  Review of Systems  Musculoskeletal: Positive for neck pain.  Neurological: Positive for headaches.  Psychiatric/Behavioral: Positive for depression. Negative for suicidal ideas, hallucinations, memory loss and substance abuse. The patient is nervous/anxious and has insomnia.   All other systems reviewed and are negative.   Blood pressure 120/82, pulse 79, temperature 98.2 F (36.8 C), temperature source Tympanic, height 5' 3.5" (1.613 m), weight 176 lb 9.6 oz (80.105 kg), last menstrual period 01/06/2015, SpO2 94 %.Body mass index is 30.79 kg/(m^2).  General Appearance: Well Groomed  Eye Contact:  Good  Speech:  Normal Rate  Volume:  Normal  Mood:  Good  Affect:  full range  Thought Process:  Linear  Orientation:  Full (Time, Place, and Person)  Thought Content:  Negative  Suicidal Thoughts:  No  Homicidal Thoughts:  No  Memory:  Immediate;   Good Recent;   Good Remote;   Good  Judgement:  Good  Insight:  Good  Psychomotor Activity:  Negative  Concentration:  Good  Recall:  Good  Fund of Knowledge: Good  Language: Good  Akathisia:  Negative  Handed:    AIMS (if indicated):    Assets:  Communication Skills Desire for Improvement Social Support Vocational/Educational  ADL's:  Intact  Cognition: WNL  Sleep:  Fair    Is the patient at risk to self?  No. Has the patient been a risk to self in the past 6 months?  No. Has the patient been a risk to self within the distant past?  No. Is  the patient a risk to others?  No. Has the patient been a risk to others in the past 6 months?  No. Has the patient been a risk to others within the distant past?  No.  Current Medications: Current Outpatient Prescriptions  Medication Sig Dispense Refill  . Cholecalciferol (VITAMIN D-1000 MAX ST) 1000 UNITS tablet Take 1,000 Units by mouth daily.     Marland Kitchen escitalopram (LEXAPRO) 20 MG tablet Take 1 tablet (20 mg total) by mouth daily. 30 tablet 1  . esomeprazole (NEXIUM) 20 MG capsule Take 20 mg  by mouth daily at 12 noon.    Marland Kitchen levonorgestrel (MIRENA) 20 MCG/24HR IUD 1 each by Intrauterine route once.    . Lidocaine, Anorectal, 5 % CREA Apply topically.    . Multiple Vitamin (MULTIVITAMIN) capsule Take 1 capsule by mouth daily.    . rizatriptan (MAXALT) 10 MG tablet     . valACYclovir (VALTREX) 1000 MG tablet Two at onset of blisters, then two more pills 12 hours later 4 tablet 3   No current facility-administered medications for this visit.    Medical Decision Making:  Established Problem, Stable/Improving (1) and Review of Medication Regimen & Side Effects (2)  Treatment Plan Summary:  Discussed with patient what the medications. She will continue on Lexapro 20 mg daily.  She will  be referred  to the intensive outpatient program in [place]. She was provided information. She reported that she will call them tomorrow to make an appointment. She currently denied having any suicidal homicidal ideations or plans.   She reported that she has enough supply of the medication at this time.   She will call our office tomorrow if she'll have any issues or problems   More than 50% of the time spent in psychoeducation, counseling and coordination of care.    This note was generated in part or whole with voice recognition software. Voice regonition is usually quite accurate but there are transcription errors that can and very often do occur. I apologize for any typographical errors that were not  detected and corrected.   Rainey Pines, MD   02/20/2016, 4:40 PM

## 2016-02-26 ENCOUNTER — Encounter (HOSPITAL_COMMUNITY): Payer: Self-pay | Admitting: Psychiatry

## 2016-02-26 ENCOUNTER — Other Ambulatory Visit (HOSPITAL_COMMUNITY): Payer: BLUE CROSS/BLUE SHIELD | Attending: Psychiatry | Admitting: Psychiatry

## 2016-02-26 ENCOUNTER — Ambulatory Visit: Payer: BLUE CROSS/BLUE SHIELD | Admitting: Psychiatry

## 2016-02-26 DIAGNOSIS — J449 Chronic obstructive pulmonary disease, unspecified: Secondary | ICD-10-CM | POA: Insufficient documentation

## 2016-02-26 DIAGNOSIS — F331 Major depressive disorder, recurrent, moderate: Secondary | ICD-10-CM | POA: Diagnosis not present

## 2016-02-26 DIAGNOSIS — F17210 Nicotine dependence, cigarettes, uncomplicated: Secondary | ICD-10-CM | POA: Diagnosis not present

## 2016-02-26 DIAGNOSIS — F411 Generalized anxiety disorder: Secondary | ICD-10-CM | POA: Insufficient documentation

## 2016-02-26 NOTE — Progress Notes (Signed)
Psychiatric Initial Adult Assessment   Patient Identification: Erika Williamson MRN:  [id number] Date of Evaluation:  02/26/2016 Referral Source: Dr Gretel Acre Chief Complaint:   Chief Complaint    Depression; Anxiety; Stress     Visit Diagnosis:    ICD-9-CM ICD-10-CM   1. Moderate episode of recurrent major depressive disorder (HCC) 296.32 F33.1     History of Present Illness:  Erika Williamson has been depressed for years, sometimes more and sometimes less, she says.  Recently for the last few months depression has increased to the point of sleeping 16 hrs a day if she can, not taking care of her hygiene, increased irritability. Avoiding friends and activities, finding no joy in things she used to like and having suicidal thoughts which led to her referral to IOP.  No current suicidal thoughts but would be okay if she died.  Stressed out by her job in that there is too much to do and even taking a vacation did not help.  Very involved with her children and their needs with recent problems with the well at her house her son is living in, her daughter's truck needing repair, her former mother in law having dementia and only responding to her and other issues that in and of themselves are not that major but seem overwhelming to her at this moment.  Always a giving person and people turn to her for all kinds of things which she normally feels good about but now it is all an aggravation.  Wants to be home alone with no tv or other noise.    Associated Signs/Symptoms: Depression Symptoms:  depressed mood, anhedonia, hypersomnia, fatigue, difficulty concentrating, impaired memory, anxiety, (Hypo) Manic Symptoms:  Irritable Mood, Anxiety Symptoms:  Excessive Worry, Psychotic Symptoms:  none PTSD Symptoms: Negative  Past Psychiatric History: inpatient after overdose aged 82, otherwise no issues till current episode when she began therapy.  Has dealt with depression by covering it with over  involvement over the years.  Previous Psychotropic Medications: Yes   Substance Abuse History in the last 12 months:  No.  Consequences of Substance Abuse: Negative  Past Medical History:  Past Medical History  Diagnosis Date  . Tonsillitis   . Anxiety   . Shingles   . Atypical face pain     Left Sided  . Sinusitis   . Tension headache   . Foot fracture Left X2  . COPD (chronic obstructive pulmonary disease) ([place])   . Tobacco abuse   . Vitamin D deficiency disease   . HPV (human papilloma virus) infection   . GERD (gastroesophageal reflux disease)   . 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 Depression     Past Surgical History  Procedure Laterality Date  . Tubal ligation    . Tonsillectomy    . Wisdom tooth extraction    . Colonoscopy with propofol N/A 05/03/2015    Procedure: COLONOSCOPY WITH PROPOFOL;  Surgeon: Josefine Class, MD;  Location: [facility] ENDOSCOPY;  Service: Endoscopy;  Laterality: N/A;  . Esophagogastroduodenoscopy N/A 05/03/2015    Procedure: ESOPHAGOGASTRODUODENOSCOPY (EGD);  Surgeon: Josefine Class, MD;  Location[facility] END[facility];  Service: Endoscopy;  Laterality: N/A;  . Cesarean section      X2  . Cholecystectomy      Dr Burt Knack  . Leep  10/05/2013    h/o CIN II    Family Psychiatric History: father alcoholic, sister depressed  Family History:  Family History  Problem Relation Age of Onset  . Diabetes Mother   . Hypertension Mother   . COPD Mother   . Stroke Mother   . Osteoporosis Mother   . Depression Mother   . Cancer Father 88    colorectal, spread to lung  . Alcohol abuse Father   . Hypertension Father   . Depression Father   . Diabetes Sister   . COPD Sister   . Anxiety disorder Sister   . Depression Sister   . Fibromyalgia Sister   . Asthma Sister   . ADD / ADHD Son   . ADD / ADHD Daughter     Social History:   Social History   Social History  . Marital  Status: Married    Spouse Name: N/A  . Number of Children: N/A  . Years of Education: N/A   Social History Main Topics  . Smoking status: Current Every Day Smoker -- 1.00 packs/day for 22 years    Types: Cigarettes    Start date: 09/03/1993  . Smokeless tobacco: Never Used  . Alcohol Use: No  . Drug Use: No  . Sexual Activity: Yes   Other Topics Concern  . None   Social History Narrative    Additional Social History: mother was terminal for many years when she was growing up and father was alcoholic till he died  Allergies:   Allergies  Allergen Reactions  . Ivp Dye [Iodinated Diagnostic Agents]   . Acyclovir And Related   . Compazine [Prochlorperazine Edisylate]   . Erythromycin   . Levaquin [Levofloxacin In D5w]   . Levofloxacin Other (See Comments)  . Naprosyn [Naproxen]   . Nsaids   . Pristiq [Desvenlafaxine]   . Tramadol Other (See Comments)  . Ultram [Tramadol Hcl]     Metabolic Disorder Labs: No results found for: HGBA1C, MPG No results found for: PROLACTIN No results found for: CHOL, TRIG, HDL, CHOLHDL, VLDL, LDLCALC   Current Medications: Current Outpatient Prescriptions  Medication Sig Dispense Refill  . Cholecalciferol (VITAMIN D-1000 MAX ST) 1000 UNITS tablet Take 1,000 Units by mouth daily.     Marland Kitchen escitalopram (LEXAPRO) 20 MG tablet Take 1 tablet (20 mg total) by mouth daily. 30 tablet 1  . esomeprazole (NEXIUM) 20 MG capsule Take 20 mg by mouth daily at 12 noon.    Marland Kitchen levonorgestrel (MIRENA) 20 MCG/24HR IUD 1 each by Intrauterine route once.    . Lidocaine, Anorectal, 5 % CREA Apply topically.    . Multiple Vitamin (MULTIVITAMIN) capsule Take 1 capsule by mouth daily.    . rizatriptan (MAXALT) 10 MG tablet     . valACYclovir (VALTREX) 1000 MG tablet Two at onset of blisters, then two more pills 12 hours later 4 tablet 3   No current facility-administered medications for this visit.    Neurologic: Headache: Yes Seizure:  Negative Paresthesias:No  Musculoskeletal: Strength & Muscle Tone: within normal limits Gait & Station: normal Patient leans: N/A  Psychiatric Specialty Exam: ROS  There were no vitals taken for this visit.There is no weight on file to calculate BMI.  General Appearance: Well Groomed  Eye Contact:  Good  Speech:  Clear and Coherent  Volume:  Normal  Mood:  Depressed and Irritable  Affect:  Congruent  Thought Process:  Coherent and Logical  Orientation:  Full (Time, Place, and Person)  Thought Content:  Negative  Suicidal Thoughts:  No  Homicidal Thoughts:  No  Memory:  Immediate;   Good Recent;  Good Remote;   Good  Judgement:  Intact  Insight:  Fair  Psychomotor Activity:  Normal  Concentration:  Good  Recall:  Good  Fund of Knowledge:Good  Language: Good  Akathisia:  Negative  Handed:  Right  AIMS (if indicated):  0  Assets:  Communication Skills Desire for Improvement Financial Resources/Insurance Housing Intimacy Resilience Social Support Talents/Skills Transportation Vocational/Educational  ADL's:  Intact  Cognition: WNL  Sleep:  Too much    Treatment Plan Summary: daily group therapy   Donnelly Angelica, MD 5/10/20171:43 PM
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
## 2016-02-27 ENCOUNTER — Other Ambulatory Visit (HOSPITAL_COMMUNITY): Payer: BLUE CROSS/BLUE SHIELD | Admitting: Psychiatry

## 2016-02-27 DIAGNOSIS — F331 Major depressive disorder, recurrent, moderate: Secondary | ICD-10-CM | POA: Diagnosis not present

## 2016-02-27 NOTE — Progress Notes (Signed)
    Daily Group Progress Note  Program: IOP  Group Time: 9-12:00  Participation Level: Active  Behavioral Response: Appropriate  Type of Therapy:  Group Therapy  Summary of Progress: Pt.'s first day in IOP and met with case manager and psychiatrist. Pt. Came to group for the last 15 minutes and introduced herself to the group.      Erika Williamson, Ph.D., [facility]

## 2016-02-28 ENCOUNTER — Other Ambulatory Visit (HOSPITAL_COMMUNITY): Payer: BLUE CROSS/BLUE SHIELD | Admitting: Psychiatry

## 2016-02-28 DIAGNOSIS — F331 Major depressive disorder, recurrent, moderate: Secondary | ICD-10-CM | POA: Diagnosis not present

## 2016-02-28 NOTE — Progress Notes (Addendum)
    Daily Group Progress Note  Program: IOP   Brown, Jennifer B, LPC 

## 2016-02-28 NOTE — Progress Notes (Signed)
    Daily Group Progress Note  Program: IOP  Group Time: 9-12:00  Participation Level: Active  Behavioral Response: Appropriate  Type of Therapy:  Group Therapy/Psychoeducation  Summary of Progress: Pt. Reported that she was "ok". Pt. Discussed patterns of work stress and poor self-care and feeling generally overwhelmed by problem solving for work and for her family and maintenance of her home. Pt. Discussed pattern of feeling that she cannot be assertive i.e., asking for what she needs from her family because they would not understand and be supportive. Participated in instruction about the development of self-compassion i.e, mindfulness of present moment feeling, acknowledgement of common identity, and use of words of kindness toward self.      Nancie Neas, LPC

## 2016-02-28 NOTE — Progress Notes (Signed)
    Daily Group Progress Note  Program: IOP  Group Time:  9:00-10:30  Participation Level: Active  Behavioral Response: Appropriate  Type of Therapy:  Group Therapy  Summary of Progress: Pt. Presented as talkative, engaged in the group process. Pt. Brought disability paperwork and requested discharge date of 6/5. Pt. Continues to process pattern of disengaging from activities and isolating. Pt. Shared challenge of reconnecting with joyful/sensory experiences.      Group Time: 10:30-12:00  Participation Level:  Active  Behavioral Response: Appropriate  Type of Therapy: Psycho-education Group  Summary of Progress: Pt. Participated in grief and loss group with the Chaplain.  Nancie Neas, LPC

## 2016-03-02 ENCOUNTER — Other Ambulatory Visit (HOSPITAL_COMMUNITY): Payer: BLUE CROSS/BLUE SHIELD | Admitting: Licensed Clinical Social Worker

## 2016-03-02 DIAGNOSIS — F331 Major depressive disorder, recurrent, moderate: Secondary | ICD-10-CM | POA: Diagnosis not present

## 2016-03-02 NOTE — Progress Notes (Signed)
Patient ID: Erika Williamson, female   DOB: Mar 18, 1976, 40 y.o.   MRN: [id number]     IOP Group Progress Note    Group Time: 9-12   Participation Level: minimal     Behavioral Response:   appropriate   Type of Therapy:  Psychoeducation/Group Therapy   Summary of Progress: Pt participated in the first part of group facilitated by [facility] Pharmacist on medication monitoring. Pt shared she used her mindfulness technique and breathing exercises to work through her tense weekend. Asked pt to try mediation for anxiety and depression first thing in the morning. She agreed to this in hopes of starting her day off well.   Alver Fisher, Clinican

## 2016-03-03 ENCOUNTER — Other Ambulatory Visit (HOSPITAL_COMMUNITY): Payer: BLUE CROSS/BLUE SHIELD | Admitting: Psychiatry

## 2016-03-03 DIAGNOSIS — F331 Major depressive disorder, recurrent, moderate: Secondary | ICD-10-CM

## 2016-03-03 MED ORDER — GABAPENTIN 300 MG PO CAPS: 300.0000 mg | ORAL_CAPSULE | Freq: Three times a day (TID) | ORAL | Status: DC

## 2016-03-03 NOTE — Progress Notes (Signed)
  IOP Group Progress Note Group Time: 10:45-12:00  Participation Level: Active  Behavioral Response: Appropriate  Type of Therapy:  Psychoeducation  Summary of Progress: Pt. Participated in mindfulness activity - "The Raisin," Pt participated in a discussion about the importance in using mindfulness as a coping skill for depression and anxiety. Pt joined in the discussion sharing how the mindfulness activity could be used any time of the day as a coping skill to being more present and focused.          MACKENZIE,LISBETH S, Licensed Cli

## 2016-03-03 NOTE — Progress Notes (Signed)
    Daily Group Progress Note  Program: IOP  Group Time: 9:00-10:30  Participation Level: Active  Behavioral Response: Appropriate  Type of Therapy:  Group Therapy  Summary of Progress: Pt. Presented as talkative, engaged in the group process. Pt. Reported that she felt angry all day yesterday. Pt. Pattern of feeling intense emotions and then feeling emotionally exhausted and apathetic. Pt. Shared that she was aware that her anger was related to feelings towards her daughter's father and things that he does to hurt her and taking on excessive responsibility for her sister's emotions.      Eloise Levels, Ph.D., [facility]

## 2016-03-03 NOTE — Progress Notes (Signed)
Patient ID: Erika Williamson, female   DOB: 1976/09/03, 40 y.o.   MRN: [id number] [name] complains of increased anger and irritability, mad for no reason all the time and overreacts to little things with anger.  Plan:  Aripiprazole was considered to help with the anger but we decided together to try gabapentin 300 mg tid first to help with the anxiety which might also help with the anger indirectly

## 2016-03-04 ENCOUNTER — Other Ambulatory Visit (HOSPITAL_COMMUNITY): Payer: BLUE CROSS/BLUE SHIELD | Admitting: Psychiatry

## 2016-03-04 DIAGNOSIS — F331 Major depressive disorder, recurrent, moderate: Secondary | ICD-10-CM

## 2016-03-04 NOTE — Progress Notes (Signed)
Patient ID: Erika Williamson, female   DOB: September 27, 1976, 40 y.o.   MRN: [id number] The Neurontin was very helpful with anxiety and nerve pain but caused her face and throat to swell.  She also had a high and drunk feeling.  Will discontinue as an allergic reaction

## 2016-03-05 ENCOUNTER — Other Ambulatory Visit (HOSPITAL_COMMUNITY): Payer: BLUE CROSS/BLUE SHIELD | Admitting: Psychiatry

## 2016-03-05 DIAGNOSIS — F331 Major depressive disorder, recurrent, moderate: Secondary | ICD-10-CM

## 2016-03-05 NOTE — Progress Notes (Signed)
    Daily Group Progress Note  Program: IOP  Group Time: 9:00-10:30  Participation Level: Active  Behavioral Response: Appropriate  Type of Therapy:  Group Therapy  Summary of Progress: Pt. Presented as mostly quiet, offered positive feedback to other group member regarding having compassion for being an imperfect parent. Pt. Shared that she had allergic reaction to medication and spoke with Dr. Lovena Le regarding discontinuing the medication.      Group Time: 10:30-12:00  Participation Level:  Active  Behavioral Response: Appropriate  Type of Therapy: Psycho-education Group  Summary of Progress: Pt. Participated in yoga therapy with Jan Fireman, LPC.  Nancie Neas, LPC

## 2016-03-06 ENCOUNTER — Other Ambulatory Visit (HOSPITAL_COMMUNITY): Payer: BLUE CROSS/BLUE SHIELD | Admitting: Psychiatry

## 2016-03-06 DIAGNOSIS — F331 Major depressive disorder, recurrent, moderate: Secondary | ICD-10-CM

## 2016-03-06 NOTE — Progress Notes (Signed)
    Daily Group Progress Note  Program: IOP  Group Time: 9:00-12:00  Participation Level: Active  Behavioral Response: Appropriate  Type of Therapy:  Group Therapy  Summary of Progress: Pt. Presented as talkative, engaged in the group process. Pt. Discussed her anger and pattern of isolation from friends and family. Pt. Shared that she depends on her cat for emotional support.      Eloise Levels, PhD., [facility]

## 2016-03-09 ENCOUNTER — Other Ambulatory Visit (HOSPITAL_COMMUNITY): Payer: BLUE CROSS/BLUE SHIELD

## 2016-03-10 ENCOUNTER — Telehealth (HOSPITAL_COMMUNITY): Payer: Self-pay | Admitting: Psychiatry

## 2016-03-10 ENCOUNTER — Other Ambulatory Visit (HOSPITAL_COMMUNITY): Payer: BLUE CROSS/BLUE SHIELD | Admitting: Psychiatry

## 2016-03-10 NOTE — Progress Notes (Signed)
    Daily Group Progress Note  Program: IOP  Group Time: 9:00-10:30  Participation Level: Active  Behavioral Response: Appropriate  Type of Therapy:  Group Therapy  Summary of Progress: Pt. Presented as talkative and engaged in the group process. Pt. Shared that her most frequent feeling is anger, irritability and general apathy. Pt. Expressed difficulty connecting with sadness or hurt related to disengagement from others.      Group Time: 10:30-12:00  Participation Level:  Active  Behavioral Response: Appropriate  Type of Therapy: Psycho-education Group  Summary of Progress: Pt. Participated in grief and loss with the Chaplain.  Nancie Neas, LPC

## 2016-03-11 ENCOUNTER — Other Ambulatory Visit (HOSPITAL_COMMUNITY): Payer: BLUE CROSS/BLUE SHIELD | Admitting: Psychiatry

## 2016-03-11 DIAGNOSIS — F331 Major depressive disorder, recurrent, moderate: Secondary | ICD-10-CM

## 2016-03-12 ENCOUNTER — Other Ambulatory Visit (HOSPITAL_COMMUNITY): Payer: BLUE CROSS/BLUE SHIELD | Admitting: Psychiatry

## 2016-03-12 DIAGNOSIS — F331 Major depressive disorder, recurrent, moderate: Secondary | ICD-10-CM | POA: Diagnosis not present

## 2016-03-12 NOTE — Progress Notes (Signed)
    Daily Group Progress Note  Program: IOP  Group Time: 9:0-12:00  Participation Level: Active  Behavioral Response: Appropriate  Type of Therapy:  Group Therapy  Summary of Progress: Pt. Presents as lethargic, reports that she feels tired. Pt. Shared with the group that she is recovering from a migraine. Pt. Reported that she took a xanax last night and slept for 6 hours which was very good for her and would like a prescription for xanax to help with sleep and anxiety.    Nancie Neas, LPC

## 2016-03-13 ENCOUNTER — Other Ambulatory Visit (HOSPITAL_COMMUNITY): Payer: BLUE CROSS/BLUE SHIELD | Admitting: Psychiatry

## 2016-03-13 NOTE — Progress Notes (Signed)
    Daily Group Progress Note  Program: IOP  Group Time: 9:00-12:00  Participation Level: Active  Behavioral Response: Appropriate  Type of Therapy:  Group Therapy    Summary of Progress: Client was alert to group process, focuses on giving advice to others. Pt. Reports that she has lost range of emotion and ability to experience empathy for others.    Nancie Neas, LPC

## 2016-03-17 ENCOUNTER — Other Ambulatory Visit (HOSPITAL_COMMUNITY): Payer: BLUE CROSS/BLUE SHIELD | Admitting: Psychiatry

## 2016-03-17 DIAGNOSIS — F331 Major depressive disorder, recurrent, moderate: Secondary | ICD-10-CM

## 2016-03-17 NOTE — Progress Notes (Signed)
    Daily Group Progress Note  Program: IOP  Group Time: 10:30-12:00pm  Participation Level: Active  Behavioral Response: Appropriate  Type of Therapy:  Group Therapy  Summary of Progress: Pt participated in a discussion on being present and working through depression. Pt shared with other group members how she used to feel before coming to the program. Her depression was all-encompassing. She now feels like a blanket has been lifted off her head and she can see recovery. She was able to give other pts good feedback and suggestions on coping skills that worked for her, with the main skill being present. She does not think about her past or the future, but the present. Pt is completing the program tomorrow and has gained many coping skills for her depression.       MACKENZIE,LISBETH S, Licensed Cli

## 2016-03-18 ENCOUNTER — Other Ambulatory Visit (HOSPITAL_COMMUNITY): Payer: BLUE CROSS/BLUE SHIELD | Admitting: Psychiatry

## 2016-03-18 DIAGNOSIS — F331 Major depressive disorder, recurrent, moderate: Secondary | ICD-10-CM

## 2016-03-18 MED ORDER — ALPRAZOLAM 0.25 MG PO TABS: 0.2500 mg | ORAL_TABLET | Freq: Every evening | ORAL | Status: DC | PRN

## 2016-03-18 NOTE — Progress Notes (Signed)
    Daily Group Progress Note  Program: IOP  Group Time: 10:45-12:00pm  Participation Level: Active  Behavioral Response: Appropriate  Type of Therapy:  Group Therapy  Summary of Progress: Pt completed the IOP program today. She participated in a discussion about her self-discovery throughout the program. She is doing more self-care and is preparing to return to work. Pt shared with group members her coping skills to deal with her anxiety and depression which will be useful when she returns to work.      Williamson,Erika S, Licensed Cli

## 2016-03-18 NOTE — Patient Instructions (Signed)
Patient completed MH-IOP today.  Will follow up with Dr. Gretel Acre and Royal Piedra, LCSW within one to two weeks.  Encouraged support groups.  Return to work on 03-23-16; without any restrictions.

## 2016-03-18 NOTE — Progress Notes (Signed)
    Daily Group Progress Note  Program: IOP  Group Time: 9:00-10:30  Participation Level: Active  Behavioral Response: Appropriate  Type of Therapy:  Group Therapy  Summary of Progress: Pt. Presented as talkative. Pt. Reported that her weekend was "good and bad". Pt. Shared with the group that she was able to find a good home for a cat that she was taking care of. Pt. Shared that she volunteered to help a church friend plan a wedding, but the stress of the planning caused her to have a migraine. Pt. Reported that she continued to feel the migraine during group and the medicine that she takes for migraines had not been helpful.    Nancie Neas, LPC

## 2016-03-18 NOTE — Progress Notes (Signed)
Patient ID: FAIZAH KLINGSHIRN, female   DOB: 06-06-1976, 40 y.o.   MRN: [id number] Discharge Note  Patient:  Erika Williamson is an 40 y.o., female DOB:  1975-12-28  Date of Admission:  02/26/2016  Date of Discharge:  03/18/2016  Reason for Admission:depression  IOP Course:  Ms Mccue attended and participated in groups.  Much less depressed but still fairly anxious.  Says she has learned a lot about herself that will help her continue to change.  Her anger is much decreased.  She accepts that anxiety is a part of her life and she has a certain amount of introversion that she is learning to accept as well.  Mental Status at Discharge:no suicidal thoughts.  Anxiety prominent, depression minimal  Lab Results: No results found for this or any previous visit (from the past 48 hour(s)).   Current outpatient prescriptions:  .  ALPRAZolam (XANAX) 0.25 MG tablet, Take 1 tablet (0.25 mg total) by mouth at bedtime as needed for anxiety., Disp: 30 tablet, Rfl: 0 .  Cholecalciferol (VITAMIN D-1000 MAX ST) 1000 UNITS tablet, Take 1,000 Units by mouth daily. , Disp: , Rfl:  .  escitalopram (LEXAPRO) 20 MG tablet, Take 1 tablet (20 mg total) by mouth daily., Disp: 30 tablet, Rfl: 1 .  esomeprazole (NEXIUM) 20 MG capsule, Take 20 mg by mouth daily at 12 noon., Disp: , Rfl:  .  levonorgestrel (MIRENA) 20 MCG/24HR IUD, 1 each by Intrauterine route once., Disp: , Rfl:  .  Lidocaine, Anorectal, 5 % CREA, Apply topically., Disp: , Rfl:  .  Multiple Vitamin (MULTIVITAMIN) capsule, Take 1 capsule by mouth daily., Disp: , Rfl:  .  rizatriptan (MAXALT) 10 MG tablet, , Disp: , Rfl:  .  valACYclovir (VALTREX) 1000 MG tablet, Two at onset of blisters, then two more pills 12 hours later, Disp: 4 tablet, Rfl: 3  Axis Diagnosis:  Major depression, recurrent moderate.  Generalized anxiety disorder   Level of Care:  IOP  Discharge destination:  Other:  has appointments with therapist and psychiatrist  Is  patient on multiple antipsychotic therapies at discharge:  No    Has Patient had three or more failed trials of antipsychotic monotherapy by history:  Negative  Patient phone:  [phone] (home)  Patient address:   [address] [id number],   Follow-up recommendations:  Activity:  continue current activity Diet:  continue current diet  Comments:  Renewed alprazolam 0.25 mg daily prn # 30 no additional refills  The patient received suicide prevention pamphlet:  Yes   Donnelly Angelica 03/18/2016, 10:00 AM

## 2016-03-18 NOTE — Progress Notes (Signed)
Kayra B Mui is a 40 y.o. ,married, employed, Caucasian female, who was referred per Dr. Gretel Acre; treatment for worsening depressive and anxiety symptoms with passive SI. Denied a plan or intent. Discussed safety options at length, pt was able to contract for safety. Patient reported feeling depressed all her life; but stated symptoms started to get worse in September 2016. 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 Reports last day working was 02-20-16. Stated that the workload had increased. 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 2) Financial Strain. Reported recently contributing ~ $3,000 into a well pump and is still having problems out of it. "I was called last week from my neighbor telling me to come home immediately due to water running down the street from my yard. After seeing the damage, this is when I just lost it.Marland KitchenMarland KitchenI just broke down." 3) Unresolved grief/loss issues: On 02-24-16, one of her newly born kittens died. Pt stated she had gotten very attached to the cat. Also, pt's female friend, who has cancer is currently going thru treatments. This brings up grief issues from whenever mother was taken off life support in February 06, 1998. Father died in 02-07-2011 due to cancer. Pt stated she was the caregiver for both parents. Reported a lot of estate issues. "Everyone wanted money. I didn't have time to grieve." Pt denied any prior psychiatric hospitalizations. Has been seeing Royal Piedra, LCSW and Dr. Gretel Acre on an outpatient basis. Attempted suicide at age 56 via OD. Family Hx: Father, P-GM, P-GF (ETOH); P-GF (Drugs); Sister (Depression). Pt completed [facility]-IOP today.  Although reporting feeling better overall; pt continues to c/o a lot of anxiety.  States the groups were helpful.  "I made realizations about myself...one being  that I have a great support network."  Pt mentioned she will be going on a church trip this weekend and is looking forward to it.  Pt denies SI/HI or A/V hallucinations.  A:  D/C today.  F/U with Dr. Gretel Acre and Royal Piedra, LCSW.  Encouraged support groups.  Pt to return to work on 03-23-16; without any restrictions.  R:  Pt receptive.         Carlis Abbott, RITA, M.Ed, CNA

## 2016-03-19 ENCOUNTER — Other Ambulatory Visit (HOSPITAL_COMMUNITY): Payer: BLUE CROSS/BLUE SHIELD

## 2016-03-19 NOTE — Progress Notes (Signed)
    Daily Group Progress Note  Program: IOP  Group Time: 9:00-10:30  Participation Level: Active  Behavioral Response: Appropriate  Type of Therapy:  Group Therapy   Summary of progress: Pt. was discharged today. She was very active in conversation, and in some ways was trying to compete with the therapist. She made some comments that were not necessarily helpful. She felt good about discharging, and mentioned coping skills that she was going to put in place to manage her anxiety.    Nancie Neas, LPC

## 2016-03-20 ENCOUNTER — Other Ambulatory Visit (HOSPITAL_COMMUNITY): Payer: BLUE CROSS/BLUE SHIELD

## 2016-03-20 ENCOUNTER — Ambulatory Visit: Payer: BLUE CROSS/BLUE SHIELD | Admitting: Psychiatry

## 2016-03-23 ENCOUNTER — Ambulatory Visit (INDEPENDENT_AMBULATORY_CARE_PROVIDER_SITE_OTHER): Payer: BLUE CROSS/BLUE SHIELD | Admitting: Psychiatry

## 2016-03-23 ENCOUNTER — Other Ambulatory Visit (HOSPITAL_COMMUNITY): Payer: BLUE CROSS/BLUE SHIELD

## 2016-03-23 ENCOUNTER — Encounter: Payer: Self-pay | Admitting: Psychiatry

## 2016-03-23 VITALS — BP 110/88 | HR 89 | Temp 98.1°F | Ht 63.5 in | Wt 177.2 lb

## 2016-03-23 DIAGNOSIS — F332 Major depressive disorder, recurrent severe without psychotic features: Secondary | ICD-10-CM | POA: Diagnosis not present

## 2016-03-23 MED ORDER — LAMOTRIGINE 25 MG PO TABS: 25.0000 mg | ORAL_TABLET | Freq: Every day | ORAL | Status: DC

## 2016-03-23 NOTE — Progress Notes (Signed)
Psychiatric MD Progress Note   Patient Identification: Erika Williamson MRN:  [id number] Date of Evaluation:  03/23/2016 Referral Source: IOP Chief Complaint:   Chief Complaint    Follow-up; Medication Refill     Visit Diagnosis:    ICD-9-CM ICD-10-CM   1. Severe episode of recurrent major depressive disorder, without psychotic features ([place]) 296.33 F33.2     History of Present Illness:  Erika Williamson Is a 40 year old female who was recently discharged from the [facility] program in [place]. She reported that she is doing better since she has completed the program. However she is not feeling completely well and she continues to have agitation and more swings and anger. She came here on Friday and found out that her appointment has been canceled. She went back to the [place] office and expressed her frustration. Patient reported that she has started working today but feels that it is too early for her to go back to work. She reported that she has been trying to improve with her depressive symptoms as she loses temper quickly. She was tried on Neurontin while in the [facility]UP program but she had an allergic reaction. Patient reported that she feels tired and agitated most of the time. She is also taking Lexapro on a daily basis. She is going to start therapy on a weekly basis. She reported that the breathing techniques have been very helpful and she is trying to use them on a regular basis. Patient reported that her more symptoms are not improving. She is also taking Xanax on a when necessary basis. She sleeps poorly at this time. She appears agitated during the interview. She currently denied having any suicidal ideations or plans.  She has started taking turmeric pills over-the-counter. She is going to discuss her medications with her primary care physician.  Associated Signs/Symptoms: Depression Symptoms:  depressed mood, anhedonia, hypersomnia, fatigue, difficulty concentrating, impaired  memory, anxiety, (Hypo) Manic Symptoms:  Irritable Mood, Anxiety Symptoms:  Excessive Worry, Psychotic Symptoms:  none PTSD Symptoms: Negative  Past Psychiatric History: inpatient after overdose aged 59, otherwise no issues till current episode when she began therapy.  Has dealt with depression by covering it with over involvement over the years.  Previous Psychotropic Medications: Yes   Substance Abuse History in the last 12 months:  No.  Consequences of Substance Abuse: Negative  Past Medical History:  Past Medical History  Diagnosis Date  . Tonsillitis   . Anxiety   . Shingles   . Atypical face pain     Left Sided  . Sinusitis   . Tension headache   . Foot fracture Left X2  . COPD (chronic obstructive pulmonary disease) ([place])   . Tobacco abuse   . Vitamin D deficiency disease   . HPV (human papilloma virus) infection   . GERD (gastroesophageal reflux disease)   . 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 Depression     Past Surgical History  Procedure Laterality Date  . Tubal ligation    . Tonsillectomy    . Wisdom tooth extraction    . Colonoscopy with propofol N/A 05/03/2015    Procedure: COLONOSCOPY WITH PROPOFOL;  Surgeon: Josefine Class, MD;  Location: [facility] ENDOSCOPY;  Service: Endoscopy;  Laterality: N/A;  . Esophagogastroduodenoscopy N/A 05/03/2015    Procedure: ESOPHAGOGASTRODUODENOSCOPY (EGD);  Surgeon: Josefine Class, MD;  Location: [facility] ENDOSCOPY;  Service: Endoscopy;  Laterality: N/A;  . Cesarean section  X2  . Cholecystectomy      Dr Burt Knack  . Leep  10/05/2013    h/o CIN II    Family Psychiatric History: father alcoholic, sister depressed  Family History:  Family History  Problem Relation Age of Onset  . Diabetes Mother   . Hypertension Mother   . COPD Mother   . Stroke Mother   . Osteoporosis Mother   . Depression Mother   . Cancer Father 63    colorectal, spread to lung   . Alcohol abuse Father   . Hypertension Father   . Depression Father   . Diabetes Sister   . COPD Sister   . Anxiety disorder Sister   . Depression Sister   . Fibromyalgia Sister   . Asthma Sister   . ADD / ADHD Son   . ADD / ADHD Daughter     Social History:   Social History   Social History  . Marital Status: Married    Spouse Name: N/A  . Number of Children: N/A  . Years of Education: N/A   Social History Main Topics  . Smoking status: Current Every Day Smoker -- 1.00 packs/day for 22 years    Types: Cigarettes    Start date: 09/03/1993  . Smokeless tobacco: Never Used  . Alcohol Use: No  . Drug Use: No  . Sexual Activity: Yes   Other Topics Concern  . None   Social History Narrative    Additional Social History: mother was terminal for many years when she was growing up and father was alcoholic till he died  Allergies:   Allergies  Allergen Reactions  . Ivp Dye [Iodinated Diagnostic Agents]   . Acyclovir And Related   . Compazine [Prochlorperazine Edisylate]   . Erythromycin   . Levaquin [Levofloxacin In D5w]   . Levofloxacin Other (See Comments)  . Naprosyn [Naproxen]   . Nsaids   . Pristiq [Desvenlafaxine]   . Tramadol Other (See Comments)  . Ultram [Tramadol Hcl]     Metabolic Disorder Labs: No results found for: HGBA1C, MPG No results found for: PROLACTIN No results found for: CHOL, TRIG, HDL, CHOLHDL, VLDL, LDLCALC   Current Medications: Current Outpatient Prescriptions  Medication Sig Dispense Refill  . ALPRAZolam (XANAX) 0.25 MG tablet Take 1 tablet (0.25 mg total) by mouth at bedtime as needed for anxiety. 30 tablet 0  . Cholecalciferol (VITAMIN D-1000 MAX ST) 1000 UNITS tablet Take 1,000 Units by mouth daily.     Marland Kitchen escitalopram (LEXAPRO) 20 MG tablet Take 1 tablet (20 mg total) by mouth daily. 30 tablet 1  . esomeprazole (NEXIUM) 20 MG capsule Take 20 mg by mouth daily at 12 noon.    Marland Kitchen levonorgestrel (MIRENA) 20 MCG/24HR IUD 1 each by  Intrauterine route once.    . Misc Natural Products (TURMERIC CURCUMIN) CAPS Take 500 capsules by mouth.    . Multiple Vitamin (MULTIVITAMIN) capsule Take 1 capsule by mouth daily.    . rizatriptan (MAXALT) 10 MG tablet     . valACYclovir (VALTREX) 1000 MG tablet Two at onset of blisters, then two more pills 12 hours later 4 tablet 3   No current facility-administered medications for this visit.    Neurologic: Headache: Yes Seizure: Negative Paresthesias:No  Musculoskeletal: Strength & Muscle Tone: within normal limits Gait & Station: normal Patient leans: N/A  Psychiatric Specialty Exam: ROS   Blood pressure 110/88, pulse 89, temperature 98.1 F (36.7 C), temperature source Tympanic, height 5' 3.5" (1.613 m), weight  177 lb 3.2 oz (80.377 kg), last menstrual period 03/14/2016, SpO2 92 %.Body mass index is 30.89 kg/(m^2).  General Appearance: Well Groomed  Eye Contact:  Good  Speech:  Clear and Coherent  Volume:  Normal  Mood:  Depressed and Irritable  Affect:  Congruent  Thought Process:  Coherent and Logical  Orientation:  Full (Time, Place, and Person)  Thought Content:  Negative  Suicidal Thoughts:  No  Homicidal Thoughts:  No  Memory:  Immediate;   Good Recent;   Good Remote;   Good  Judgement:  Intact  Insight:  Fair  Psychomotor Activity:  Normal  Concentration:  Good  Recall:  Good  Fund of Knowledge:Good  Language: Good  Akathisia:  Negative  Handed:  Right  AIMS (if indicated):  0  Assets:  Communication Skills Desire for Improvement Financial Resources/Insurance Housing Intimacy Resilience Social Support Talents/Skills Transportation Vocational/Educational  ADL's:  Intact  Cognition: WNL  Sleep:  Too much    Treatment Plan Summary: Discussed the medications with the patient. She will continue on Lexapro 20 mg daily. She will also continue on Xanax when necessary. Patient has supply of both the medications.  I will start her on lamotrigine 25  mg by mouth daily for her mood stabilization. Discussed with her about the side effects of the medication detail including the risk of rash and Katherina Right syndrome and she demonstrated understanding. She will follow-up in 2 weeks or earlier depending on her symptoms.   More than 50% of the time spent in psychoeducation, counseling and coordination of care.    This note was generated in part or whole with voice recognition software. Voice regonition is usually quite accurate but there are transcription errors that can and very often do occur. I apologize for any typographical errors that were not detected and corrected.    Rainey Pines, MD 6/5/201710:16 AM

## 2016-03-24 ENCOUNTER — Other Ambulatory Visit (HOSPITAL_COMMUNITY): Payer: BLUE CROSS/BLUE SHIELD

## 2016-03-25 ENCOUNTER — Other Ambulatory Visit (HOSPITAL_COMMUNITY): Payer: BLUE CROSS/BLUE SHIELD

## 2016-03-26 ENCOUNTER — Other Ambulatory Visit (HOSPITAL_COMMUNITY): Payer: BLUE CROSS/BLUE SHIELD

## 2016-03-26 ENCOUNTER — Ambulatory Visit (INDEPENDENT_AMBULATORY_CARE_PROVIDER_SITE_OTHER): Payer: BLUE CROSS/BLUE SHIELD | Admitting: Licensed Clinical Social Worker

## 2016-03-26 DIAGNOSIS — F332 Major depressive disorder, recurrent severe without psychotic features: Secondary | ICD-10-CM | POA: Diagnosis not present

## 2016-03-27 ENCOUNTER — Other Ambulatory Visit (HOSPITAL_COMMUNITY): Payer: BLUE CROSS/BLUE SHIELD

## 2016-03-27 NOTE — Progress Notes (Signed)
   THERAPIST PROGRESS NOTE  Session Time: 78min  Participation Level: Active  Behavioral Response: CasualAlertDepressed  Type of Therapy: Individual Therapy  Treatment Goals addressed: Coping and Diagnosis: Depression  Interventions: CBT, Motivational Interviewing, Solution Focused, Strength-based, Supportive, Family Systems and Reframing  Summary: Erika Williamson is a 40 y.o. female who presents with continued symptoms of her diagnosis.  LCSW discussed what psychotherapy is and is not and the importance of the therapeutic relationship to include open and honest communication between client and therapist and building trust.  Reviewed advantages and disadvantages of the therapeutic process and limitations to the therapeutic relationship including LCSW's role in maintaining the safety of the client, others and those in client's care.  Discussion of her IOP sessions & skills she learned.  Reviewed her triggers & stressors.  Discussion of activities that her husband can do to assist with symptoms.     Suicidal/Homicidal: Nowithout intent/plan  Therapist Response:LCSW provided Patient with ongoing emotional support and encouragement.  Normalized her feelings.  Commended Patient on her progress and reinforced the importance of client staying focused on her own strengths and resources and resiliency. Processed various strategies for dealing with stressors.    Plan: Return again in 1 weeks.  Diagnosis: Axis I: Major Depression, Recurrent severe    Axis II: No diagnosis    Lubertha South, LCSW 03/27/2016

## 2016-03-30 ENCOUNTER — Other Ambulatory Visit (HOSPITAL_COMMUNITY): Payer: BLUE CROSS/BLUE SHIELD

## 2016-03-31 ENCOUNTER — Other Ambulatory Visit (HOSPITAL_COMMUNITY): Payer: BLUE CROSS/BLUE SHIELD

## 2016-04-01 ENCOUNTER — Other Ambulatory Visit (HOSPITAL_COMMUNITY): Payer: BLUE CROSS/BLUE SHIELD

## 2016-04-02 ENCOUNTER — Other Ambulatory Visit (HOSPITAL_COMMUNITY): Payer: BLUE CROSS/BLUE SHIELD

## 2016-04-03 ENCOUNTER — Encounter: Payer: Self-pay | Admitting: Family Medicine

## 2016-04-03 ENCOUNTER — Ambulatory Visit (INDEPENDENT_AMBULATORY_CARE_PROVIDER_SITE_OTHER): Payer: BLUE CROSS/BLUE SHIELD | Admitting: Family Medicine

## 2016-04-03 ENCOUNTER — Other Ambulatory Visit (HOSPITAL_COMMUNITY): Payer: BLUE CROSS/BLUE SHIELD

## 2016-04-03 VITALS — BP 108/62 | HR 91 | Temp 99.2°F | Resp 16 | Wt 177.0 lb

## 2016-04-03 DIAGNOSIS — E559 Vitamin D deficiency, unspecified: Secondary | ICD-10-CM

## 2016-04-03 DIAGNOSIS — Z720 Tobacco use: Secondary | ICD-10-CM

## 2016-04-03 DIAGNOSIS — G43009 Migraine without aura, not intractable, without status migrainosus: Secondary | ICD-10-CM

## 2016-04-03 DIAGNOSIS — R87613 High grade squamous intraepithelial lesion on cytologic smear of cervix (HGSIL): Secondary | ICD-10-CM

## 2016-04-03 DIAGNOSIS — F331 Major depressive disorder, recurrent, moderate: Secondary | ICD-10-CM | POA: Diagnosis not present

## 2016-04-03 DIAGNOSIS — L237 Allergic contact dermatitis due to plants, except food: Secondary | ICD-10-CM

## 2016-04-03 HISTORY — DX: Migraine without aura, not intractable, without status migrainosus: G43.009

## 2016-04-03 NOTE — Assessment & Plan Note (Signed)
"  one thing at a time" she says; I am here to help if/when she is ready to quit

## 2016-04-03 NOTE — Assessment & Plan Note (Signed)
Last vit D was above 30 in October 2016 (34.7)

## 2016-04-03 NOTE — Assessment & Plan Note (Signed)
Followed by Dr. Marcelline Mates

## 2016-04-03 NOTE — Assessment & Plan Note (Signed)
Suggested calamine lotion, cortisone topically; getting rid of the offending agent or cleaning thoroughly is key; cover up in future and shower immediately after

## 2016-04-03 NOTE — Assessment & Plan Note (Signed)
On lamictal for psychiatric reasons but will help migraines as well she hopes; seeing neurologist

## 2016-04-03 NOTE — Progress Notes (Signed)
BP 108/62 mmHg  Pulse 91  Temp(Src) 99.2 F (37.3 C) (Oral)  Resp 16  Wt 177 lb (80.287 kg)  SpO2 95%  LMP 03/14/2016   Subjective:    Patient ID: Erika Williamson, female    DOB: 1975/12/11, 40 y.o.   MRN: [id number]  HPI: Erika Williamson is a 40 y.o. female  Chief Complaint  Patient presents with  . Follow-up  . Depression    needing paperwork but does not have with her   She has been diagnosed with major depressive disorder She had a breakdown in May; she is seeing a psychiatrist, Dr. Gretel Acre Dr. Gretel Acre told her she had a choice to go to intensive outpatient program or inpatient; she has been seeing Dr. Donnelly Angelica in [place]  She is wanting to know if I will do paperwork for intermittent leave of absences; she says her psychiatrist won't do any paperwork There were days when she would stay at home She was out of work from May 4th to June 5th; they did the paperwork for that in [place] She does not see Dr. Lovena Le any more, now just seeing Dr. Gretel Acre; discharged from IOP May 31st Dr. Gretel Acre clued in on her issues; she stayed out of work that day; she was given some blood pressure pills that they usually give for migraines; she kept looking at the pills and thought about taking them and going to bed and not waking up; that was in early May; she didn't think about it like she would be killing herself; she just thought of it like going to sleep and not waking up; she told her husband to get rid of them or she would die; she called Dr. Gretel Acre the next day; she does not think of herself as wanting to go to bed and not ever waking up and it would be okay; she says Dr. Gretel Acre and Dr. Lovena Le both know she feels this way; she has gone from severe depression to moderate stage of depression She just doesn't care any more; the psychiatrists and therapists told her "that's normal" She doesn't have sympathy or empathy any more; she has anger and anxiety; she is past that  point; when your brain has had enough, it shuts that done She saw Dr. Gretel Acre on Monday a week ago and goes back next Monday (in 3 days) Not sleeping very well No energy; drinking her diet [place] right now, took a drink while we were in teh room She won't let that demon win; like a demon around her neck, like a monkey on your back she says; spiritually it's like a demon around her neck; not hearing voices, no visual hallucinations Going to church; her pastor has been wonderful; his wife has depression and she understands She has alprazolam for when she needs it; he gave her thirty of the lowest dose, and she has only taken one since she was discharge; she has "anxiety issues really badly" She feels like she is at a standstill; she feels like she is backtracking actually; she is going to wake up at the same time every day; has overslept the past two mornings, not something she usually does Dr. Gretel Acre started her on Lamictal; she is aware of risk of rash; she started it last Monday, then on Tuesday, she was cleaning out poison oak of a flower bed; on Wednesday, she has some spots on her right wrist, one spot on her left forearm; put those shoes back on and now  has new spot on ankle Gardening for exercise  She saw Dr. Manuella Ghazi last week for migraines; neurologist; her migraines have actually increased; she has two different kinds  Patient does not think she has COPD; I reveiwed chest xrays and no mention of COPD; will remove from dx list  Depression screen [facility] 2/9 04/03/2016 08/07/2015 07/31/2015 07/22/2015  Decreased Interest 0 2 3 3   Down, Depressed, Hopeless 3 2 3 3   PHQ - 2 Score 3 4 6 6   Altered sleeping 3 0 0 1  Tired, decreased energy 3 2 3 3   Change in appetite 3 3 3 3   Feeling bad or failure about yourself  0 2 3 3   Trouble concentrating 3 0 3 3  Moving slowly or fidgety/restless 1 1 0 0  Suicidal thoughts 0 1 3 3   PHQ-9 Score 16 13 21 22   Difficult doing work/chores Extremely dIfficult Not  difficult at all Very difficult -  Some encounter information is confidential and restricted. Go to Review Flowsheets activity to see all data.   Relevant past medical, surgical, family and social history reviewed Past Medical History  Diagnosis Date  . Tonsillitis   . Anxiety   . Shingles   . Atypical face pain     Left Sided  . Sinusitis   . Tension headache   . Foot fracture Left X2  . COPD (chronic obstructive pulmonary disease) ([place])   . Tobacco abuse   . Vitamin D deficiency disease   . HPV (human papilloma virus) infection   . GERD (gastroesophageal reflux disease)   . 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 Depression   . Migraine without aura 04/03/2016  . Moderate recurrent major depression ([place]) 07/22/2015   Past Surgical History  Procedure Laterality Date  . Tubal ligation    . Tonsillectomy    . Wisdom tooth extraction    . Colonoscopy with propofol N/A 05/03/2015    Procedure: COLONOSCOPY WITH PROPOFOL;  Surgeon: Josefine Class, MD;  Location: [facility] ENDOSCOPY;  Service: Endoscopy;  Laterality: N/A;  . Esophagogastroduodenoscopy N/A 05/03/2015    Procedure: ESOPHAGOGASTRODUODENOSCOPY (EGD);  Surgeon: Josefine Class, MD;  Location: [facility] ENDOSCOPY;  Service: Endoscopy;  Laterality: N/A;  . Cesarean section      X2  . Cholecystectomy      Dr Burt Knack  . Leep  10/05/2013    h/o CIN II   Family History  Problem Relation Age of Onset  . Diabetes Mother   . Hypertension Mother   . COPD Mother   . Stroke Mother   . Osteoporosis Mother   . Depression Mother   . Cancer Father 41    colorectal, spread to lung  . Alcohol abuse Father   . Hypertension Father   . Depression Father   . Diabetes Sister   . COPD Sister   . Anxiety disorder Sister   . Depression Sister   . Fibromyalgia Sister   . Asthma Sister   . ADD / ADHD Son   . ADD / ADHD Daughter    Social History  Substance Use Topics  .  Smoking status: Current Every Day Smoker -- 1.00 packs/day for 22 years    Types: Cigarettes    Start date: 09/03/1993  . Smokeless tobacco: Never Used  . Alcohol Use: No   Interim medical history since last visit reviewed. Allergies and medications reviewed  Review of Systems Per HPI unless specifically  indicated above     Objective:    BP 108/62 mmHg  Pulse 91  Temp(Src) 99.2 F (37.3 C) (Oral)  Resp 16  Wt 177 lb (80.287 kg)  SpO2 95%  LMP 03/14/2016  Wt Readings from Last 3 Encounters:  04/03/16 177 lb (80.287 kg)  03/23/16 177 lb 3.2 oz (80.377 kg)  02/20/16 176 lb 9.6 oz (80.105 kg)    Physical Exam  Constitutional: She appears well-developed and well-nourished. No distress.  Cardiovascular: Normal rate.   Pulmonary/Chest: Effort normal.  Abdominal: She exhibits no distension.  Neurological: She is alert. She displays no tremor.  No tics  Skin: No pallor.  Rash on the volar surface right wrist, few linear marks; single vesicular lesion with erythema on left volar arm  Psychiatric: Her mood appears not anxious. Her affect is not labile and not inappropriate. Cognition and memory are not impaired. She does not express impulsivity or inappropriate judgment. She does not exhibit a depressed mood.  Patient makes very good eye contact with examiner; good historian; speaks about future; no active suicidal plans; discussed episode of getting angry, going to her care to get anxiety pill; very matter of fact; not delusional; not grandiose; not tearful; not flat      Assessment & Plan:   Problem List Items Addressed This Visit      Cardiovascular and Mediastinum   Migraine without aura    On lamictal for psychiatric reasons but will help migraines as well she hopes; seeing neurologist        Musculoskeletal and Integument   Contact dermatitis due to poison oak - Primary    Suggested calamine lotion, cortisone topically; getting rid of the offending agent or cleaning  thoroughly is key; cover up in future and shower immediately after        Other   High grade squamous intraepithelial cervical dysplasia    Followed by Dr. Marcelline Mates      Moderate recurrent major depression ([place])    Under the care of Dr. Gretel Acre; she will see her on Monday; encouraged her to get outdoor and get some sun exposure (outside peak hours); contact crisis line or call 911 if needed; I will respectfully request that the psychiatrist fill out any work excuses or paperwork for leave if related to depression; patient understands      Tobacco abuse    "one thing at a time" she says; I am here to help if/when she is ready to quit      Vitamin D deficiency disease    Last vit D was above 30 in October 2016 (34.7)         Follow up plan: Return in about 6 weeks (around 05/15/2016).  An after-visit summary was printed and given to the patient at [place].  Please see the patient instructions which may contain other information and recommendations beyond what is mentioned above in the assessment and plan.

## 2016-04-03 NOTE — Assessment & Plan Note (Signed)
Under the care of Dr. Gretel Acre; she will see her on Monday; encouraged her to get outdoor and get some sun exposure (outside peak hours); contact crisis line or call 911 if needed; I will respectfully request that the psychiatrist fill out any work excuses or paperwork for leave if related to depression; patient understands

## 2016-04-03 NOTE — Patient Instructions (Addendum)
Please do talk with Dr. Gretel Acre about your question about being out of work for missed days If she feels it's appropriate for you to miss work for your depression, then I respectfully expect her to write your work notes or excuses so we'll ask her to fill out your paperwork Call your numbers (crisis line or 911) if needed I am here to help you quit smoking if/when you are ready Continue vitamin D 5,000 iu TWICE A WEEK We can recheck your labs at follow-up Continue multiple vitamin daily Try some topical cortisone for the rash and get rid of those shoes   Steps to Elicit the Relaxation Response The following is the technique reprinted with permission from Dr. Billie Ruddy book The Relaxation Response pages 162-163 1. Sit quietly in a comfortable position. 2. Close your eyes. 3. Deeply relax all your muscles,  beginning at your feet and progressing up to your face.  Keep them relaxed. 4. Breathe through your nose.  Become aware of your breathing.  As you breathe out, say the word, "one"*,  silently to yourself. For example,  breathe in ... out, "one",- in .. out, "one", etc.  Breathe easily and naturally. 5. Continue for 10 to 20 minutes.  You may open your eyes to check the time, but do not use an alarm.  When you finish, sit quietly for several minutes,  at first with your eyes closed and later with your eyes opened.  Do not stand up for a few minutes. 6. Do not worry about whether you are successful  in achieving a deep level of relaxation.  Maintain a passive attitude and permit relaxation to occur at its own pace.  When distracting thoughts occur,  try to ignore them by not dwelling upon them  and return to repeating "one."  With practice, the response should come with little effort.  Practice the technique once or twice daily,  but not within two hours after any meal,  since the digestive processes seem to interfere with  the elicitation of the Relaxation Response. * It  is better to use a soothing, mellifluous sound, preferably with no meaning. or association, to avoid stimulation of unnecessary thoughts - a mantra.

## 2016-04-04 ENCOUNTER — Encounter: Payer: Self-pay | Admitting: Family Medicine

## 2016-04-06 ENCOUNTER — Encounter: Payer: Self-pay | Admitting: Psychiatry

## 2016-04-06 ENCOUNTER — Other Ambulatory Visit (HOSPITAL_COMMUNITY): Payer: BLUE CROSS/BLUE SHIELD

## 2016-04-06 ENCOUNTER — Ambulatory Visit (INDEPENDENT_AMBULATORY_CARE_PROVIDER_SITE_OTHER): Payer: BLUE CROSS/BLUE SHIELD | Admitting: Psychiatry

## 2016-04-06 VITALS — BP 118/80 | HR 90 | Temp 98.6°F | Ht 63.5 in | Wt 178.4 lb

## 2016-04-06 DIAGNOSIS — F332 Major depressive disorder, recurrent severe without psychotic features: Secondary | ICD-10-CM

## 2016-04-06 MED ORDER — LAMOTRIGINE 25 MG PO TABS: 50.0000 mg | ORAL_TABLET | Freq: Every day | ORAL | Status: DC

## 2016-04-06 MED ORDER — ESCITALOPRAM OXALATE 10 MG PO TABS: 10.0000 mg | ORAL_TABLET | Freq: Every day | ORAL | Status: DC

## 2016-04-06 NOTE — Progress Notes (Signed)
Psychiatric MD Progress Note   Patient Identification: Erika Williamson MRN:  [id number] Date of Evaluation:  04/06/2016 Referral Source: IOP Chief Complaint:   Chief Complaint    Follow-up; Medication Refill     Visit Diagnosis:    ICD-9-CM ICD-10-CM   1. Severe episode of recurrent major depressive disorder, without psychotic features ([place]) 296.33 F33.2     History of Present Illness:  Erika Williamson Is a 40 year old female who Presented for the follow-up appointment. She reported that she continues to have conflict at her work. She reported that she was yelling at her workplace at [employer]. Reported that she does not get along well with her coworkers and does not do her work as she is supposed to work. She reported that she was yelling at her boss. Patient reported that she gets frustrated easily. She has been started on lamotrigine and reported that it has been helping with her mood symptoms. Patient reported that she wants to be placed on FMLA and was talking about the same. She reported that she has been working at [employer] for the past 14 years and she is the only worker can do the work in the right session.  It appears that she has noticed worsening of her symptoms since she was discharged from the  the IOP program in [place] as she does not like her workplace and wants to stay at home.   She was talking about in detail about her workplace issues and reported that she loses temper quickly.   She reported that she was unable to keep her appointment as the therapist was out for 3 days last week.   . She currently denied having any suicidal ideations or plans.   Associated Signs/Symptoms: Depression Symptoms:  depressed mood, anhedonia, hypersomnia, fatigue, difficulty concentrating, impaired memory, anxiety, (Hypo) Manic Symptoms:  Irritable Mood, Anxiety Symptoms:  Excessive Worry, Psychotic Symptoms:  none PTSD Symptoms: Negative  Past Psychiatric History: inpatient  after overdose aged 74, otherwise no issues till current episode when she began therapy.  Has dealt with depression by covering it with over involvement over the years.  Previous Psychotropic Medications: Yes   Substance Abuse History in the last 12 months:  No.  Consequences of Substance Abuse: Negative  Past Medical History:  Past Medical History  Diagnosis Date  . Tonsillitis   . Anxiety   . Shingles   . Atypical face pain     Left Sided  . Sinusitis   . Tension headache   . Foot fracture Left X2  . COPD (chronic obstructive pulmonary disease) ([place])   . Tobacco abuse   . Vitamin D deficiency disease   . HPV (human papilloma virus) infection   . GERD (gastroesophageal reflux disease)   . 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 Depression   . Migraine without aura 04/03/2016  . Moderate recurrent major depression ([place]) 07/22/2015    Past Surgical History  Procedure Laterality Date  . Tubal ligation    . Tonsillectomy    . Wisdom tooth extraction    . Colonoscopy with propofol N/A 05/03/2015    Procedure: COLONOSCOPY WITH PROPOFOL;  Surgeon: Josefine Class, MD;  Location: [facility] ENDOSCOPY;  Service: Endoscopy;  Laterality: N/A;  . Esophagogastroduodenoscopy N/A 05/03/2015    Procedure: ESOPHAGOGASTRODUODENOSCOPY (EGD);  Surgeon: Josefine Class, MD;  Location: [facility] ENDOSCOPY;  Service: Endoscopy;  Laterality: N/A;  . Cesarean section  X2  . Cholecystectomy      Dr Burt Knack  . Le[place]  10/05/2013    h/o CIN II    Family Psychiatric History: father alcoholic, sister depressed  Family History:  Family History  Problem Relation Age of Onset  . Diabetes Mother   . Hypertension Mother   . COPD Mother   . Stroke Mother   . Osteoporosis Mother   . Depression Mother   . Cancer Father 61    colorectal, spread to lung  . Alcohol abuse Father   . Hypertension Father   . Depression Father   . Diabetes Sister    . COPD Sister   . Anxiety disorder Sister   . Depression Sister   . Fibromyalgia Sister   . Asthma Sister   . ADD / ADHD Son   . ADD / ADHD Daughter     Social History:   Social History   Social History  . Marital Status: Married    Spouse Name: N/A  . Number of Children: N/A  . Years of Education: N/A   Social History Main Topics  . Smoking status: Current Every Day Smoker -- 1.00 packs/day for 22 years    Types: Cigarettes    Start date: 09/03/1993  . Smokeless tobacco: Never Used  . Alcohol Use: No  . Drug Use: No  . Sexual Activity: Yes   Other Topics Concern  . None   Social History Narrative    Additional Social History: mother was terminal for many years when she was growing up and father was alcoholic till he died  Allergies:   Allergies  Allergen Reactions  . Ivp Dye [Iodinated Diagnostic Agents]   . Acyclovir And Related   . Compazine [Prochlorperazine Edisylate]   . Erythromycin   . Levaquin [Levofloxacin In D5w]   . Levofloxacin Other (See Comments)  . Naprosyn [Naproxen]   . Nsaids   . Pristiq [Desvenlafaxine]   . Tramadol Other (See Comments)  . Ultram [Tramadol Hcl]     Metabolic Disorder Labs: No results found for: HGBA1C, MPG No results found for: PROLACTIN No results found for: CHOL, TRIG, HDL, CHOLHDL, VLDL, LDLCALC   Current Medications: Current Outpatient Prescriptions  Medication Sig Dispense Refill  . ALPRAZolam (XANAX) 0.25 MG tablet Take 1 tablet (0.25 mg total) by mouth at bedtime as needed for anxiety. 30 tablet 0  . Cholecalciferol (VITAMIN D-1000 MAX ST) 1000 UNITS tablet Take 1,000 Units by mouth daily.     Marland Kitchen escitalopram (LEXAPRO) 20 MG tablet Take 1 tablet (20 mg total) by mouth daily. 30 tablet 1  . esomeprazole (NEXIUM) 20 MG capsule Take 20 mg by mouth daily at 12 noon.    . lamoTRIgine (LAMICTAL) 25 MG tablet Take 1 tablet (25 mg total) by mouth daily. 30 tablet 0  . levonorgestrel (MIRENA) 20 MCG/24HR IUD 1 each  by Intrauterine route once.    . Misc Natural Products (TURMERIC CURCUMIN) CAPS Take 500 capsules by mouth.    . Multiple Vitamin (MULTIVITAMIN) capsule Take 1 capsule by mouth daily.    . rizatriptan (MAXALT) 10 MG tablet     . valACYclovir (VALTREX) 1000 MG tablet Two at onset of blisters, then two more pills 12 hours later 4 tablet 3   No current facility-administered medications for this visit.    Neurologic: Headache: Yes Seizure: Negative Paresthesias:No  Musculoskeletal: Strength & Muscle Tone: within normal limits Gait & Station: normal Patient leans: N/A  Psychiatric Specialty Exam: ROS  Blood pressure 118/80, pulse 90, temperature 98.6 F (37 C), temperature source Tympanic, height 5' 3.5" (1.613 m), weight 178 lb 6.4 oz (80.922 kg), last menstrual period 03/14/2016, SpO2 92 %.Body mass index is 31.1 kg/(m^2).  General Appearance: Well Groomed  Eye Contact:  Good  Speech:  Clear and Coherent  Volume:  Normal  Mood:  Depressed and Irritable  Affect:  Congruent  Thought Process:  Coherent and Logical  Orientation:  Full (Time, Place, and Person)  Thought Content:  Negative  Suicidal Thoughts:  No  Homicidal Thoughts:  No  Memory:  Immediate;   Good Recent;   Good Remote;   Good  Judgement:  Intact  Insight:  Fair  Psychomotor Activity:  Normal  Concentration:  Good  Recall:  Good  Fund of Knowledge:Good  Language: Good  Akathisia:  Negative  Handed:  Right  AIMS (if indicated):  0  Assets:  Communication Skills Desire for Improvement Financial Resources/Insurance Housing Intimacy Resilience Social Support Talents/Skills Transportation Vocational/Educational  ADL's:  Intact  Cognition: WNL  Sleep:  Too much    Treatment Plan Summary: I will decrease the dose of Lexapro 10 mg daily as it is causing worsening of her mood symptoms. Will titrate lamotrigine 50 mg daily to help with her mood stabilization. Advised patient to continue with the  therapy appointments. She is focused on getting FMLA at this time Advised patient that I will discuss with her therapist and will let her know    More than 50% of the time spent in psychoeducation, counseling and coordination of care.    This note was generated in part or whole with voice recognition software. Voice regonition is usually quite accurate but there are transcription errors that can and very often do occur. I apologize for any typographical errors that were not detected and corrected.    Rainey Pines, MD 6/19/201710:31 AM

## 2016-04-07 ENCOUNTER — Ambulatory Visit (INDEPENDENT_AMBULATORY_CARE_PROVIDER_SITE_OTHER): Payer: BLUE CROSS/BLUE SHIELD | Admitting: Psychiatry

## 2016-04-07 ENCOUNTER — Ambulatory Visit (INDEPENDENT_AMBULATORY_CARE_PROVIDER_SITE_OTHER): Payer: BLUE CROSS/BLUE SHIELD | Admitting: Licensed Clinical Social Worker

## 2016-04-07 ENCOUNTER — Encounter: Payer: Self-pay | Admitting: Psychiatry

## 2016-04-07 ENCOUNTER — Other Ambulatory Visit (HOSPITAL_COMMUNITY): Payer: BLUE CROSS/BLUE SHIELD

## 2016-04-07 VITALS — BP 104/76 | HR 63 | Ht 63.5 in

## 2016-04-07 DIAGNOSIS — F332 Major depressive disorder, recurrent severe without psychotic features: Secondary | ICD-10-CM | POA: Diagnosis not present

## 2016-04-07 DIAGNOSIS — F3160 Bipolar disorder, current episode mixed, unspecified: Secondary | ICD-10-CM

## 2016-04-07 DIAGNOSIS — F609 Personality disorder, unspecified: Secondary | ICD-10-CM

## 2016-04-07 MED ORDER — ESCITALOPRAM OXALATE 10 MG PO TABS: 15.0000 mg | ORAL_TABLET | Freq: Every day | ORAL | Status: DC

## 2016-04-07 NOTE — Progress Notes (Signed)
Psychiatric MD Progress Note   Patient Identification: Erika Williamson MRN:  [id number] Date of Evaluation:  04/07/2016 Referral Source: IOP Chief Complaint:   Chief Complaint    Medication Problem; Other     Visit Diagnosis:    ICD-9-CM ICD-10-CM   1. Bipolar I disorder, most recent episode mixed (Erika Williamson) 296.60 F31.60   2. Personality disorder 301.9 F60.9     History of Present Illness:  Ms Erika Williamson Is a 40 year old female who Presented for the follow-up appointment. She Was seen yesterday and currently she has been following with Elmyra Ricks her therapist and then she started telling her that she is feeling very agitated and took a day off from work as she was unable to control her symptoms. She reported that the medication changes which are made yesterday were making her ill. During this interview patient reported that she woke up very agitated angry this morning and she was unable to face anybody in the outside world. She reported that she wants to go back on Lexapro 15 mg daily. Patient continues to have rapid speech and was difficult to redirect. She reported that she felt angry irritable most of the time. She continues to have problems at her work. She reported that she was very angry last night and it continued into this morning.  We discussed about several medication options including lithium and Seroquel and Abilify for mood stabilization but she declined. She reported that she wants to try the lamotrigine 50 mg in combination with Lexapro at this time. She also smokes 1 pack of cigarettes on a daily basis.   Patient has poor coping skills and is not improving in the therapy at this time. She gets frustrated easily and will lose temper.  She reported that she slept well last night. She denied having any suicidal homicidal ideations or plans.  She is trying to get FMLA at her work     Associated Signs/Symptoms: Depression Symptoms:  depressed mood, psychomotor  agitation, difficulty concentrating, impaired memory, anxiety, (Hypo) Manic Symptoms:  Distractibility, Impulsivity, Irritable Mood, Labiality of Mood, Anxiety Symptoms:  Excessive Worry, Psychotic Symptoms:  none PTSD Symptoms: Negative  Past Psychiatric History: inpatient after overdose aged 47, otherwise no issues till current episode when she began therapy.  Has dealt with depression by covering it with over involvement over the years.  Previous Psychotropic Medications: Yes   Substance Abuse History in the last 12 months:  No.  Consequences of Substance Abuse: Negative  Past Medical History:  Past Medical History  Diagnosis Date  . Tonsillitis   . Anxiety   . Shingles   . Atypical face pain     Left Sided  . Sinusitis   . Tension headache   . Foot fracture Left X2  . COPD (chronic obstructive pulmonary disease) ([place])   . Tobacco abuse   . Vitamin D deficiency disease   . HPV (human papilloma virus) infection   . GERD (gastroesophageal reflux disease)   . 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 Depression   . Migraine without aura 04/03/2016  . Moderate recurrent major depression ([place]) 07/22/2015    Past Surgical History  Procedure Laterality Date  . Tubal ligation    . Tonsillectomy    . Wisdom tooth extraction    . Colonoscopy with propofol N/A 05/03/2015    Procedure: COLONOSCOPY WITH PROPOFOL;  Surgeon: Josefine Class, MD;  Location: [facility] ENDOSCOPY;  Service: Endoscopy;  Laterality: N/A;  . Esophagogastroduodenoscopy N/A 05/03/2015    Procedure: ESOPHAGOGASTRODUODENOSCOPY (EGD);  Surgeon: Josefine Class, MD;  Location: [facility] ENDOSCOPY;  Service: Endoscopy;  Laterality: N/A;  . Cesarean section      X2  . Cholecystectomy      Dr Burt Knack  . Leep  10/05/2013    h/o CIN II    Family Psychiatric History: father alcoholic, sister depressed  Family History:  Family History  Problem Relation  Age of Onset  . Diabetes Mother   . Hypertension Mother   . COPD Mother   . Stroke Mother   . Osteoporosis Mother   . Depression Mother   . Cancer Father 25    colorectal, spread to lung  . Alcohol abuse Father   . Hypertension Father   . Depression Father   . Diabetes Sister   . COPD Sister   . Anxiety disorder Sister   . Depression Sister   . Fibromyalgia Sister   . Asthma Sister   . ADD / ADHD Son   . ADD / ADHD Daughter     Social History:   Social History   Social History  . Marital Status: Married    Spouse Name: N/A  . Number of Children: N/A  . Years of Education: N/A   Social History Main Topics  . Smoking status: Current Every Day Smoker -- 1.00 packs/day for 22 years    Types: Cigarettes    Start date: 09/03/1993  . Smokeless tobacco: Never Used  . Alcohol Use: No  . Drug Use: No  . Sexual Activity: Yes   Other Topics Concern  . None   Social History Narrative    Additional Social History: mother was terminal for many years when she was growing up and father was alcoholic till he died  Allergies:   Allergies  Allergen Reactions  . Ivp Dye [Iodinated Diagnostic Agents]   . Acyclovir And Related   . Compazine [Prochlorperazine Edisylate]   . Erythromycin   . Levaquin [Levofloxacin In D5w]   . Levofloxacin Other (See Comments)  . Naprosyn [Naproxen]   . Nsaids   . Pristiq [Desvenlafaxine]   . Tramadol Other (See Comments)  . Ultram [Tramadol Hcl]     Metabolic Disorder Labs: No results found for: HGBA1C, MPG No results found for: PROLACTIN No results found for: CHOL, TRIG, HDL, CHOLHDL, VLDL, LDLCALC   Current Medications: Current Outpatient Prescriptions  Medication Sig Dispense Refill  . ALPRAZolam (XANAX) 0.25 MG tablet Take 1 tablet (0.25 mg total) by mouth at bedtime as needed for anxiety. 30 tablet 0  . Cholecalciferol (VITAMIN D-1000 MAX ST) 1000 UNITS tablet Take 1,000 Units by mouth daily.     Marland Kitchen escitalopram (LEXAPRO) 10 MG  tablet Take 1.5 tablets (15 mg total) by mouth daily. Pt has supply 30 tablet 1  . esomeprazole (NEXIUM) 20 MG capsule Take 20 mg by mouth daily at 12 noon.    . lamoTRIgine (LAMICTAL) 25 MG tablet Take 2 tablets (50 mg total) by mouth daily. 60 tablet 0  . levonorgestrel (MIRENA) 20 MCG/24HR IUD 1 each by Intrauterine route once.    . Misc Natural Products (TURMERIC CURCUMIN) CAPS Take 500 capsules by mouth.    . Multiple Vitamin (MULTIVITAMIN) capsule Take 1 capsule by mouth daily.    . rizatriptan (MAXALT) 10 MG tablet     . valACYclovir (VALTREX) 1000 MG tablet Two at onset of blisters, then two more pills 12 hours later 4 tablet 3  No current facility-administered medications for this visit.    Neurologic: Headache: Yes Seizure: Negative Paresthesias:No  Musculoskeletal: Strength & Muscle Tone: within normal limits Gait & Station: normal Patient leans: N/A  Psychiatric Specialty Exam: ROS   Blood pressure 104/76, pulse 63, height 5' 3.5" (1.613 m), last menstrual period 03/14/2016, SpO2 95 %.There is no weight on file to calculate BMI.  General Appearance: Well Groomed  Eye Contact:  Good  Speech:  Clear and Coherent  Volume:  Normal  Mood:  Anxious and Irritable  Affect:  Congruent  Thought Process:  Goal Directed  Orientation:  Full (Time, Place, and Person)  Thought Content:  WDL and Rumination  Suicidal Thoughts:  No  Homicidal Thoughts:  No  Memory:  Immediate;   Good Recent;   Good Remote;   Good  Judgement:  Intact  Insight:  Fair  Psychomotor Activity:  Normal  Concentration:  Good  Recall:  Good  Fund of Knowledge:Good  Language: Good  Akathisia:  Negative  Handed:  Right  AIMS (if indicated):  0  Assets:  Communication Skills Desire for Improvement Financial Resources/Insurance Housing Intimacy Resilience Social Support Talents/Skills Transportation Vocational/Educational  ADL's:  Intact  Cognition: WNL  Sleep:  Too much    Treatment  Plan Summary: Lexapro 15 mg daily as she wants to go higher on the dose of the medication Will titrate lamotrigine 50 mg daily to help with her mood stabilization. Advised patient to continue with the therapy appointments. She is focused on getting FMLA at this time Advised patient that I will discuss with her therapist and will let her know    More than 50% of the time spent in psychoeducation, counseling and coordination of care.    This note was generated in part or whole with voice recognition software. Voice regonition is usually quite accurate but there are transcription errors that can and very often do occur. I apologize for any typographical errors that were not detected and corrected.    Rainey Pines, MD 6/20/201710:35 AM

## 2016-04-08 ENCOUNTER — Other Ambulatory Visit (HOSPITAL_COMMUNITY): Payer: BLUE CROSS/BLUE SHIELD

## 2016-04-09 ENCOUNTER — Other Ambulatory Visit (HOSPITAL_COMMUNITY): Payer: BLUE CROSS/BLUE SHIELD

## 2016-04-10 ENCOUNTER — Other Ambulatory Visit (HOSPITAL_COMMUNITY): Payer: BLUE CROSS/BLUE SHIELD

## 2016-04-13 ENCOUNTER — Other Ambulatory Visit (HOSPITAL_COMMUNITY): Payer: BLUE CROSS/BLUE SHIELD

## 2016-04-14 ENCOUNTER — Ambulatory Visit (INDEPENDENT_AMBULATORY_CARE_PROVIDER_SITE_OTHER): Payer: BLUE CROSS/BLUE SHIELD | Admitting: Licensed Clinical Social Worker

## 2016-04-14 ENCOUNTER — Other Ambulatory Visit (HOSPITAL_COMMUNITY): Payer: BLUE CROSS/BLUE SHIELD

## 2016-04-14 DIAGNOSIS — F332 Major depressive disorder, recurrent severe without psychotic features: Secondary | ICD-10-CM

## 2016-04-15 ENCOUNTER — Other Ambulatory Visit (HOSPITAL_COMMUNITY): Payer: BLUE CROSS/BLUE SHIELD

## 2016-04-16 ENCOUNTER — Other Ambulatory Visit (HOSPITAL_COMMUNITY): Payer: BLUE CROSS/BLUE SHIELD

## 2016-04-16 NOTE — Progress Notes (Signed)
   THERAPIST PROGRESS NOTE  Session Time: 41  Participation Level: Active  Behavioral Response: CasualAlertDepressed  Type of Therapy: Individual Therapy  Treatment Goals addressed: Coping  Interventions: CBT, Motivational Interviewing, Solution Focused, Strength-based, Supportive and Reframing  Summary: Erika Williamson is a 40 y.o. female who presents with continued symptoms of her diagnosis  She was able to list her current stressors and worries.  She reports that she has changed medication which has made her angry.  Discussion of medication change and her making a doctor appointment to discuss with her Psychiatrist.  Discussion of her job duties and the possibility of stress at work.  Discussion of coping skills that may be useful while at work.  Client discussed examples of situations where there are difficulties with boundaries and boundary setting and self-assertion while at work. Factors that contribute to client's ongoing depressive symptoms were discussed and include real and perceived feelings of isolation, criticism, rejection, shame and guilt.   Suicidal/Homicidal: Nowithout intent/plan  Therapist Response: LCSW provided Patient with ongoing emotional support and encouragement.  Normalized her feelings.  Commended Patient on her progress and reinforced the importance of client staying focused on her own strengths and resources and resiliency. Processed various strategies for dealing with stressors.    Plan: Return again in [place].  Diagnosis: Axis I: Major Depression, Recurrent severe    Axis II: No diagnosis    Lubertha South, LCSW 6/29/201706/21/2017

## 2016-04-17 ENCOUNTER — Other Ambulatory Visit (HOSPITAL_COMMUNITY): Payer: BLUE CROSS/BLUE SHIELD

## 2016-04-17 NOTE — Progress Notes (Signed)
   THERAPIST PROGRESS NOTE  Session Time: 10  Participation Level: Active  Behavioral Response: CasualAlertDepressed  Type of Therapy: Individual Therapy  Treatment Goals addressed: Coping and Diagnosis: Depression  Interventions: CBT, Motivational Interviewing, Solution Focused, Strength-based, Supportive and Reframing  Summary: Erika Williamson is a 40 y.o. female who presents with continued symptoms of her diagnosis.  She was able to verbally express her current stressors and worries.  She reports that she has been upset since 4am when she received a telephone call saying her best friend's son was killed in a car accident. Assisted Patient with understanding the grief and loss process.    Suicidal/Homicidal: Nowithout intent/plan  Therapist Response: LCSW provided Patient with ongoing emotional support and encouragement.  Normalized her feelings.  Commended Patient on her progress and reinforced the importance of client staying focused on her own strengths and resources and resiliency. Processed various strategies for dealing with stressors.    Plan: Return again in 1 weeks.  Diagnosis: Axis I: Major Depression, Recurrent severe    Axis II: No diagnosis    Lubertha South, LCSW 04/15/2016

## 2016-04-20 ENCOUNTER — Other Ambulatory Visit (HOSPITAL_COMMUNITY): Payer: BLUE CROSS/BLUE SHIELD

## 2016-04-23 ENCOUNTER — Ambulatory Visit (INDEPENDENT_AMBULATORY_CARE_PROVIDER_SITE_OTHER): Payer: BLUE CROSS/BLUE SHIELD | Admitting: Licensed Clinical Social Worker

## 2016-04-23 DIAGNOSIS — F332 Major depressive disorder, recurrent severe without psychotic features: Secondary | ICD-10-CM

## 2016-04-28 ENCOUNTER — Ambulatory Visit: Payer: BLUE CROSS/BLUE SHIELD | Admitting: Licensed Clinical Social Worker

## 2016-04-30 ENCOUNTER — Ambulatory Visit (INDEPENDENT_AMBULATORY_CARE_PROVIDER_SITE_OTHER): Payer: BLUE CROSS/BLUE SHIELD | Admitting: Licensed Clinical Social Worker

## 2016-04-30 DIAGNOSIS — F332 Major depressive disorder, recurrent severe without psychotic features: Secondary | ICD-10-CM

## 2016-05-04 ENCOUNTER — Ambulatory Visit (INDEPENDENT_AMBULATORY_CARE_PROVIDER_SITE_OTHER): Payer: BLUE CROSS/BLUE SHIELD | Admitting: Psychiatry

## 2016-05-04 ENCOUNTER — Encounter: Payer: Self-pay | Admitting: Psychiatry

## 2016-05-04 DIAGNOSIS — F609 Personality disorder, unspecified: Secondary | ICD-10-CM | POA: Diagnosis not present

## 2016-05-04 DIAGNOSIS — F3160 Bipolar disorder, current episode mixed, unspecified: Secondary | ICD-10-CM | POA: Diagnosis not present

## 2016-05-04 DIAGNOSIS — J449 Chronic obstructive pulmonary disease, unspecified: Secondary | ICD-10-CM | POA: Insufficient documentation

## 2016-05-04 MED ORDER — LAMOTRIGINE 25 MG PO TABS: 50.0000 mg | ORAL_TABLET | Freq: Every day | ORAL | Status: DC

## 2016-05-04 MED ORDER — ESCITALOPRAM OXALATE 10 MG PO TABS: 15.0000 mg | ORAL_TABLET | Freq: Every day | ORAL | Status: DC

## 2016-05-04 MED ORDER — ALPRAZOLAM 0.25 MG PO TABS: 0.2500 mg | ORAL_TABLET | Freq: Every evening | ORAL | Status: DC | PRN

## 2016-05-04 NOTE — Progress Notes (Signed)
Psychiatric MD Progress Note   Patient Identification: Erika Williamson MRN:  [id number] Date of Evaluation:  05/04/2016 Referral Source: IOP Chief Complaint:   Chief Complaint    Follow-up; Medication Refill     Visit Diagnosis:    ICD-9-CM ICD-10-CM   1. Personality disorder 301.9 F60.9   2. Bipolar I disorder, most recent episode mixed ([place]) 296.60 F31.60     History of Present Illness:  Erika Williamson Is a 40 year old female who presented for the follow-up appointment. She Reported that she has started improving on her medications. She has been following with her therapy appointments on a regular basis. Patient reported that her best friend's son recently passed away in a car accident  and she has been depressed because of the same. She has been a great support for her. Patient reported that she has been trying to help her going to the trauma and has been providing her support and therapy at this time. We discussed at length about the same. Patient reported that she has been taking her medications as prescribed and seems that the current combination of medication as being very helpful. She reported that her medications are controlling her emotions at this time. She denied having any mood swings on a regular basis. She is doing well at her work. She currently takes alprazolam on a when necessary basis. We discussed about the medications and she does not want to have her medications titrated at this time.  She reported that she slept well last night. She denied having any suicidal homicidal ideations or plans.      Associated Signs/Symptoms: Depression Symptoms:  depressed mood, psychomotor agitation, difficulty concentrating, impaired memory, anxiety, (Hypo) Manic Symptoms:  Distractibility, Impulsivity, Irritable Mood, Labiality of Mood, Anxiety Symptoms:  Excessive Worry, Psychotic Symptoms:  none PTSD Symptoms: Negative  Past Psychiatric History: inpatient after overdose  aged 46, otherwise no issues till current episode when she began therapy.  Has dealt with depression by covering it with over involvement over the years.  Previous Psychotropic Medications: Yes   Substance Abuse History in the last 12 months:  No.  Consequences of Substance Abuse: Negative  Past Medical History:  Past Medical History  Diagnosis Date  . Tonsillitis   . Anxiety   . Shingles   . Atypical face pain     Left Sided  . Sinusitis   . Tension headache   . Foot fracture Left X2  . COPD (chronic obstructive pulmonary disease) ([place])   . Tobacco abuse   . Vitamin D deficiency disease   . HPV (human papilloma virus) infection   . GERD (gastroesophageal reflux disease)   . 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 Depression   . Migraine without aura 04/03/2016  . Moderate recurrent major depression ([place]) 07/22/2015    Past Surgical History  Procedure Laterality Date  . Tubal ligation    . Tonsillectomy    . Wisdom tooth extraction    . Colonoscopy with propofol N/A 05/03/2015    Procedure: COLONOSCOPY WITH PROPOFOL;  Surgeon: Josefine Class, MD;  Location: [facility] ENDOSCOPY;  Service: Endoscopy;  Laterality: N/A;  . Esophagogastroduodenoscopy N/A 05/03/2015    Procedure: ESOPHAGOGASTRODUODENOSCOPY (EGD);  Surgeon: Josefine Class, MD;  Location: [facility] ENDOSCOPY;  Service: Endoscopy;  Laterality: N/A;  . Cesarean section      X2  . Cholecystectomy      Dr Burt Knack  . [place]  10/05/2013  h/o CIN II    Family Psychiatric History: father alcoholic, sister depressed  Family History:  Family History  Problem Relation Age of Onset  . Diabetes Mother   . Hypertension Mother   . COPD Mother   . Stroke Mother   . Osteoporosis Mother   . Depression Mother   . Cancer Father 14    colorectal, spread to lung  . Alcohol abuse Father   . Hypertension Father   . Depression Father   . Diabetes Sister   . COPD Sister    . Anxiety disorder Sister   . Depression Sister   . Fibromyalgia Sister   . Asthma Sister   . ADD / ADHD Son   . ADD / ADHD Daughter     Social History:   Social History   Social History  . Marital Status: Married    Spouse Name: N/A  . Number of Children: N/A  . Years of Education: N/A   Social History Main Topics  . Smoking status: Current Every Day Smoker -- 1.00 packs/day for 22 years    Types: Cigarettes    Start date: 09/03/1993  . Smokeless tobacco: Never Used  . Alcohol Use: No  . Drug Use: No  . Sexual Activity: Yes   Other Topics Concern  . None   Social History Narrative    Additional Social History: mother was terminal for many years when she was growing up and father was alcoholic till he died  Allergies:   Allergies  Allergen Reactions  . Ivp Dye [Iodinated Diagnostic Agents]   . Acyclovir And Related   . Compazine [Prochlorperazine Edisylate]   . Erythromycin   . Levaquin [Levofloxacin In D5w]   . Levofloxacin Other (See Comments)  . Naprosyn [Naproxen]   . Nsaids   . Pristiq [Desvenlafaxine]   . Tramadol Other (See Comments)  . Ultram [Tramadol Hcl]     Metabolic Disorder Labs: No results found for: HGBA1C, MPG No results found for: PROLACTIN No results found for: CHOL, TRIG, HDL, CHOLHDL, VLDL, LDLCALC   Current Medications: Current Outpatient Prescriptions  Medication Sig Dispense Refill  . ALPRAZolam (XANAX) 0.25 MG tablet Take 1 tablet (0.25 mg total) by mouth at bedtime as needed for anxiety. 30 tablet 0  . Cholecalciferol (VITAMIN D-1000 MAX ST) 1000 UNITS tablet Take 1,000 Units by mouth daily.     Marland Kitchen escitalopram (LEXAPRO) 10 MG tablet Take 1.5 tablets (15 mg total) by mouth daily. Pt has supply 30 tablet 1  . esomeprazole (NEXIUM) 20 MG capsule Take 20 mg by mouth daily at 12 noon.    . lamoTRIgine (LAMICTAL) 25 MG tablet Take 2 tablets (50 mg total) by mouth daily. 60 tablet 0  . levonorgestrel (MIRENA) 20 MCG/24HR IUD 1 each  by Intrauterine route once.    . Misc Natural Products (TURMERIC CURCUMIN) CAPS Take 500 capsules by mouth.    . Multiple Vitamin (MULTIVITAMIN) capsule Take 1 capsule by mouth daily.    . rizatriptan (MAXALT) 10 MG tablet     . valACYclovir (VALTREX) 1000 MG tablet Two at onset of blisters, then two more pills 12 hours later 4 tablet 3   No current facility-administered medications for this visit.    Neurologic: Headache: Yes Seizure: Negative Paresthesias:No  Musculoskeletal: Strength & Muscle Tone: within normal limits Gait & Station: normal Patient leans: N/A  Psychiatric Specialty Exam: ROS   Blood pressure 124/80, pulse 76, temperature 98.4 F (36.9 C), temperature source Tympanic, height 5' 3.5" (  1.613 m), weight 180 lb (81.647 kg), last menstrual period 04/29/2016, SpO2 95 %.Body mass index is 31.38 kg/(m^2).  General Appearance: Well Groomed  Eye Contact:  Good  Speech:  Clear and Coherent  Volume:  Normal  Mood:  Anxious  Affect:  Congruent  Thought Process:  Goal Directed  Orientation:  Full (Time, Place, and Person)  Thought Content:  WDL and Rumination  Suicidal Thoughts:  No  Homicidal Thoughts:  No  Memory:  Immediate;   Good Recent;   Good Remote;   Good  Judgement:  Intact  Insight:  Fair  Psychomotor Activity:  Normal  Concentration:  Good  Recall:  Good  Fund of Knowledge:Good  Language: Good  Akathisia:  Negative  Handed:  Right  AIMS (if indicated):  0  Assets:  Communication Skills Desire for Improvement Financial Resources/Insurance Housing Intimacy Resilience Social Support Talents/Skills Transportation Vocational/Educational  ADL's:  Intact  Cognition: WNL  Sleep:  Too much    Treatment Plan Summary: Lexapro 15 mg daily  Continue  lamotrigine 50 mg daily to help with her mood stabilization. Advised patient to continue with the therapy appointments. Follow-up in one month or earlier depending on her symptoms    More than  50% of the time spent in psychoeducation, counseling and coordination of care.    This note was generated in part or whole with voice recognition software. Voice regonition is usually quite accurate but there are transcription errors that can and very often do occur. I apologize for any typographical errors that were not detected and corrected.    Rainey Pines, MD 7/17/201711:20 AM

## 2016-05-05 ENCOUNTER — Ambulatory Visit (INDEPENDENT_AMBULATORY_CARE_PROVIDER_SITE_OTHER): Payer: No Typology Code available for payment source | Admitting: Licensed Clinical Social Worker

## 2016-05-05 DIAGNOSIS — F332 Major depressive disorder, recurrent severe without psychotic features: Secondary | ICD-10-CM | POA: Diagnosis not present

## 2016-05-07 NOTE — Progress Notes (Signed)
   THERAPIST PROGRESS NOTE  Session Time:45  Participation Level: Active  Behavioral Response: CasualAlertDepressed  Type of Therapy: Individual Therapy  Treatment Goals addressed: Coping  Interventions: CBT, Motivational Interviewing, Solution Focused, Supportive and Reframing  Summary: Erika Williamson is a 40 y.o. female who presents with continued symptoms of her diagnosis. She was able to discuss stressors that continues to cause her depression.  Her friend son recently was killed in a motor vehicle accident; She continues to emotionally support her.  Discussion of boundaries of her personal emotional stress.  Discussion of triggers to her stress.  She reports working third shift as a stressor and helping her friend emotionally is a stressor.  She reports not attending church is a stressor and having physical ailments is a stressor.  During this session. She reported having a sinus headache for the last 2 days.  She reports that she continues to assist her daughter and son with daily activities.  She reports that sleeping has become her favorite activity. Session ended early due to her not feeling well.    Suicidal/Homicidal: Nowithout intent/plan  Therapist Response: LCSW provided Patient with ongoing emotional support and encouragement.  Normalized her feelings.  Commended Patient on her progress and reinforced the importance of client staying focused on her own strengths and resources and resiliency. Processed various strategies for dealing with stressors.    Plan: Return again in [place].  Diagnosis: Axis I: Major Depression, Recurrent severe    Axis II: No diagnosis    Lubertha South, LCSW 05/05/2016

## 2016-05-11 NOTE — Progress Notes (Signed)
   THERAPIST PROGRESS NOTE  Session Time: 53  Participation Level: Active  Behavioral Response: CasualAlertDepressed  Type of Therapy: Individual Therapy  Treatment Goals addressed: Coping and Diagnosis: Depression  Interventions: CBT, Motivational Interviewing, Solution Focused, Supportive, Family Systems and Reframing  Summary: Erika Williamson is a 40 y.o. female who presents with continued symptoms of her diagnosis.  Discussion of what is depression and the treatment for it.  Allow patient time to obtain her frustrations and stressors since the previous session. Patient continues to be upset regarding the death of her friend's son.  Patient has difficulty with motivation and sadness.  A trigger for patient is working third shift.  Factors that contribute to client's ongoing depressive symptoms were discussed and include real and perceived feelings of isolation, criticism, rejection, shame and guilt.  Discussion of her stress levels and how her children contribute to her symptoms.  Discussion of her taking care of herself opposed to being the #1 support system in her friend's life reminder of her coping skills   Suicidal/Homicidal: Nowithout intent/plan  Therapist Response: LCSW provided Patient with ongoing emotional support and encouragement.  Normalized her feelings.  Commended Patient on her progress and reinforced the importance of client staying focused on her own strengths and resources and resiliency. Processed various strategies for dealing with stressors.    Plan: Return again in 1 weeks.  Diagnosis: Axis I: Major Depression, Recurrent severe    Axis II: No diagnosis    Lubertha South, LCSW 04/23/2016

## 2016-05-12 NOTE — Progress Notes (Signed)
   THERAPIST PROGRESS NOTE  Session Time: 75min  Participation Level: Active  Behavioral Response: CasualAlertDepressed  Type of Therapy: Individual Therapy  Treatment Goals addressed: Coping and Diagnosis: Depression  Interventions: CBT, Motivational Interviewing, Solution Focused, Supportive, Family Systems and Reframing  Summary: Erika Williamson is a 40 y.o. female who presents with continued symptoms of her diagnosis.  She was able to list her stressors and worry since the last session.  Discussion of how her stressors and worries can trigger her mood.  Verbally listed coping skills/strategies that she has used and the positive and negative effects on her mood.  Discussion on how meme make her laugh and experience happiness for a brief moment.  Discussion of her pets making her feel warm and welcome.  She reports that she has a sinus headache that may lead to depressed mood or caused by her depressed mood.  Suicidal/Homicidal: Nowithout intent/plan  Therapist Response: LCSW provided Patient with ongoing emotional support and encouragement.  Normalized her feelings.  Commended Patient on her progress and reinforced the importance of client staying focused on her own strengths and resources and resiliency. Processed various strategies for dealing with stressors.    Plan: Return again in 1 weeks.  Diagnosis: Axis I: Major Depression, Recurrent severe    Axis II: No diagnosis    Lubertha South, LCSW 04/30/2016

## 2016-05-13 ENCOUNTER — Ambulatory Visit (INDEPENDENT_AMBULATORY_CARE_PROVIDER_SITE_OTHER): Payer: BLUE CROSS/BLUE SHIELD | Admitting: Licensed Clinical Social Worker

## 2016-05-13 DIAGNOSIS — F332 Major depressive disorder, recurrent severe without psychotic features: Secondary | ICD-10-CM | POA: Diagnosis not present

## 2016-05-15 ENCOUNTER — Ambulatory Visit: Payer: Self-pay | Admitting: Family Medicine

## 2016-05-18 ENCOUNTER — Ambulatory Visit: Payer: Self-pay | Admitting: Family Medicine

## 2016-05-19 ENCOUNTER — Encounter: Payer: Self-pay | Admitting: Emergency Medicine

## 2016-05-19 ENCOUNTER — Emergency Department
Admission: EM | Admit: 2016-05-19 | Discharge: 2016-05-19 | Disposition: A | Discharge status: Home / Self Care | Payer: BLUE CROSS/BLUE SHIELD | Attending: Emergency Medicine | Admitting: Emergency Medicine

## 2016-05-19 DIAGNOSIS — Y939 Activity, unspecified: Secondary | ICD-10-CM | POA: Insufficient documentation

## 2016-05-19 DIAGNOSIS — F17210 Nicotine dependence, cigarettes, uncomplicated: Secondary | ICD-10-CM | POA: Insufficient documentation

## 2016-05-19 DIAGNOSIS — Y92009 Unspecified place in unspecified non-institutional (private) residence as the place of occurrence of the external cause: Secondary | ICD-10-CM | POA: Insufficient documentation

## 2016-05-19 DIAGNOSIS — Y999 Unspecified external cause status: Secondary | ICD-10-CM | POA: Insufficient documentation

## 2016-05-19 DIAGNOSIS — J449 Chronic obstructive pulmonary disease, unspecified: Secondary | ICD-10-CM | POA: Insufficient documentation

## 2016-05-19 DIAGNOSIS — S41152A Open bite of left upper arm, initial encounter: Secondary | ICD-10-CM | POA: Diagnosis present

## 2016-05-19 DIAGNOSIS — W5501XA Bitten by cat, initial encounter: Secondary | ICD-10-CM | POA: Diagnosis not present

## 2016-05-19 MED ORDER — AMOXICILLIN-POT CLAVULANATE 875-125 MG PO TABS
1.0000 | ORAL_TABLET | Freq: Two times a day (BID) | ORAL | 0 refills | Status: DC
Start: 2016-05-19 — End: 2016-05-22

## 2016-05-19 NOTE — ED Provider Notes (Signed)
[facility] Emergency Department Provider Note ____________________________________________  Time seen: Approximately 9:52 AM  I have reviewed the triage vital signs and the nursing notes.   HISTORY  Chief Complaint Arm Pain   HPI Erika Williamson is a 40 y.o. female yesterday evening. Patient reports that this did occur at her house. Patient reports that this is her indoor cat. Patient reports her cat is up-to-date on rabies immunizations. Patient reports her last tetanus immunization was approximately 3 years ago and reports that her tetanus immunization is up-to-date. Patient reports her cat has bitten her in the past and she understood the importance of prompt antibiotics. Patient states that she is here for protection of antibiotics.States she believes her cat bit her as the dog aggravated the cat.  Patient reports that her cat bit her once to her left distal forearm. Patient reports mild pain, redness and swelling at bite site. Patient denies any concerns of foreign bodies or bony injury. Denies any numbness, tingling sensation, decreased movement. Denies pain radiation. Patient states that pain at this time is mild to left distal forearm.  Denies fevers, chest pain, shortness of breath, neck pain, back pain, abdominal pain, dysuria. Denies fall or any other injury.    Patient's last menstrual period was 04/29/2016. Denies concerns of pregnancy.  Enid Derry, MD PCP: Patient states that she tried to be seen by her primary care office this morning but was unable to be seen for several days.   Past Medical History:  Diagnosis Date  . 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 Anxiety   . Atypical face pain    Left Sided  . COPD (chronic obstructive pulmonary disease) ([place])   . Depression   . Foot fracture Left X2  . GERD (gastroesophageal reflux disease)   . HPV (human papilloma virus)  infection   . Migraine without aura 04/03/2016  . Moderate recurrent major depression ([place]) 07/22/2015  . Shingles   . Sinusitis   . Tension headache   . Tobacco abuse   . Tonsillitis   . Vitamin D deficiency disease     Patient Active Problem List   Diagnosis Date Noted  . Chronic obstructive pulmonary disease ([place]) 05/04/2016  . Contact dermatitis due to poison oak 04/03/2016  . Migraine without aura 04/03/2016  . NSAIDs adverse reaction 09/09/2015  . Moderate recurrent major depression ([place]) 07/22/2015  . Gluten intolerance 06/19/2015  . Tobacco abuse   . Vitamin D deficiency disease   . HPV (human papilloma virus) infection   . High grade squamous intraepithelial cervical dysplasia   . GERD (gastroesophageal reflux disease)     Past Surgical History:  Procedure Laterality Date  . CESAREAN SECTION     X2  . CHOLECYSTECTOMY     Dr Burt Knack  . COLONOSCOPY WITH PROPOFOL N/A 05/03/2015   Procedure: COLONOSCOPY WITH PROPOFOL;  Surgeon: Josefine Class, MD;  Location: [facility] ENDOSCOPY;  Service: Endoscopy;  Laterality: N/A;  . ESOPHAGOGASTRODUODENOSCOPY N/A 05/03/2015   Procedure: ESOPHAGOGASTRODUODENOSCOPY (EGD);  Surgeon: Josefine Class, MD;  Location: [facility] ENDOSCOPY;  Service: Endoscopy;  Laterality: N/A;  . 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 TONSILLECTOMY    . TUBAL LIGATION    . WISDOM TOOTH EXTRACTION      No current facility-administered medications for this encounter.   Current Outpatient Prescriptions:  .  ALPRAZolam (XANAX) 0.25 MG tablet, Take 1 tablet (0.25 mg total)  by mouth at bedtime as needed for anxiety., Disp: 30 tablet, Rfl: 0 .  amoxicillin-clavulanate (AUGMENTIN) 875-125 MG tablet, Take 1 tablet by mouth every 12 (twelve) hours., Disp: 20 tablet, Rfl: 0 .  Cholecalciferol (VITAMIN D-1000 MAX ST) 1000 UNITS tablet, Take 1,000 Units by mouth daily. , Disp: , Rfl:  .  escitalopram (LEXAPRO) 10 MG tablet, Take 1.5 tablets (15 mg total) by mouth daily. Pt  has supply, Disp: 30 tablet, Rfl: 1 .  escitalopram (LEXAPRO) 10 MG tablet, Take 1.5 tablets (15 mg total) by mouth daily., Disp: 30 tablet, Rfl: 1 .  esomeprazole (NEXIUM) 20 MG capsule, Take 20 mg by mouth daily at 12 noon., Disp: , Rfl:  .  lamoTRIgine (LAMICTAL) 25 MG tablet, Take 2 tablets (50 mg total) by mouth daily., Disp: 60 tablet, Rfl: 0 .  levonorgestrel (MIRENA) 20 MCG/24HR IUD, 1 each by Intrauterine route once., Disp: , Rfl:  .  Misc Natural Products (TURMERIC CURCUMIN) CAPS, Take 500 capsules by mouth., Disp: , Rfl:  .  Multiple Vitamin (MULTIVITAMIN) capsule, Take 1 capsule by mouth daily., Disp: , Rfl:  .  rizatriptan (MAXALT) 10 MG tablet, , Disp: , Rfl:  .  valACYclovir (VALTREX) 1000 MG tablet, Two at onset of blisters, then two more pills 12 hours later, Disp: 4 tablet, Rfl: 3  Allergies Gabapentin; Ivp dye [iodinated diagnostic agents]; Acyclovir and related; Compazine [prochlorperazine edisylate]; Erythromycin; Levaquin [levofloxacin in d5w]; Levofloxacin; Naprosyn [naproxen]; Nsaids; Pristiq [desvenlafaxine]; Tramadol; and Ultram [tramadol hcl]  Family History  Problem Relation Age of Onset  . Diabetes Mother   . Hypertension Mother   . COPD Mother   . Stroke Mother   . Osteoporosis Mother   . Depression Mother   . Cancer Father 69    colorectal, spread to lung  . Alcohol abuse Father   . Hypertension Father   . Depression Father   . Diabetes Sister   . COPD Sister   . Anxiety disorder Sister   . Depression Sister   . Fibromyalgia Sister   . Asthma Sister   . ADD / ADHD Son   . ADD / ADHD Daughter     Social History Social History  Substance Use Topics  . Smoking status: Current Every Day Smoker    Packs/day: 1.00    Years: 22.00    Types: Cigarettes    Start date: 09/03/1993  . Smokeless tobacco: Never Used  . Alcohol use No    Review of Systems Constitutional: No fever/chills Eyes: No visual changes. ENT: No sore throat. Cardiovascular:  Denies chest pain. Respiratory: Denies shortness of breath. Gastrointestinal: No abdominal pain.  No nausea, no vomiting.  No diarrhea.  No constipation. Genitourinary: Negative for dysuria. Musculoskeletal: Negative for back pain. Skin: Negative for rash.As above. Neurological: Negative for headaches, focal weakness or numbness.  10-point ROS otherwise negative.  ____________________________________________   PHYSICAL EXAM:  VITAL SIGNS: ED Triage Vitals  Enc Vitals Group     BP 05/19/16 0847 125/84     Pulse Rate 05/19/16 0847 78     Resp 05/19/16 0847 18     Temp 05/19/16 0847 98.4 F (36.9 C)     Temp Source 05/19/16 0847 Oral     SpO2 05/19/16 0847 98 %     Weight 05/19/16 0847 180 lb (81.6 kg)     Height 05/19/16 0847 5' 3.5" (1.613 m)     Head Circumference --      Peak Flow --  Pain Score 05/19/16 0832 5     Pain Loc --      Pain Edu? --      Excl. in [place]? --     Constitutional: Alert and oriented. Well appearing and in no acute distress. Eyes: Conjunctivae are normal. PERRL. EOMI. ENT      Head: Normocephalic and atraumatic.      Nose: No congestion/rhinnorhea.      Mouth/Throat: Mucous membranes are moist. Cardiovascular: Normal rate, regular rhythm. Grossly normal heart sounds.  Good peripheral circulation. Respiratory: Normal respiratory effort without tachypnea nor retractions. Breath sounds are clear and equal bilaterally. No wheezes/rales/rhonchi.. Musculoskeletal:  Nontender with normal range of motion in all extremities.  Neurologic:  Normal speech and language. No gross focal neurologic deficits are appreciated. Speech is normal. No gait instability.  Skin:  Skin is warm, dry and intact. No rash noted. Except: Distal left forearm ulnar aspect- 1 dorsal and 1 volar aspect appearance of puncture mark with surrounding superficial abrasions, mild surrounding erythema and minimal swelling at puncture sites as well as mild tenderness. Left forearm  otherwise nontender. Left forearm with full range of motion, normal distal capillary refill, no motor or tendon deficits. Bilateral hand grips strong and equal. Bilateral distal radial pulses equal and strong. Psychiatric: Mood and affect are normal. Speech and behavior are normal. Patient exhibits appropriate insight and judgment   ___________________________________________   LABS (all labs ordered are listed, but only abnormal results are displayed)  Labs Reviewed - No data to display ____________________________________________ RADIOLOGY  No results found. ____________________________________________   PROCEDURES Procedures     INITIAL IMPRESSION / ASSESSMENT AND PLAN / ED COURSE  Pertinent labs & imaging results that were available during my care of the patient were reviewed by me and considered in my medical decision making (see chart for details).  Very well-appearing patient. No acute distress. Presents for the complaints of bite to left distal forearm. Patient reports 1 Bite. Patient with mild tenderness at the puncture sites. No decreased range of motion. Discussed evaluation by x-ray, patient declines and states she does not want x-ray done. Patient reports she did clean wound at home last night. Vaughan Basta RN called and reported animal bite to animal control. Patient again states that animal is up-to-date on rabies immunizations. Patient reports her tetanus immunization is up-to-date. Will treat patient at home with oral Augmentin. Encourage keeping clean, topical over-the-counter antibiotics and close follow-up as needed.Discussed indication, risks and benefits of medications with patient.Denies need for pain medication.   Discussed follow up with Primary care physician this week. Discussed follow up and return parameters including no resolution or any worsening concerns. Patient verbalized understanding and agreed to plan.    ____________________________________________   FINAL CLINICAL IMPRESSION(S) / ED DIAGNOSES  Final diagnoses:  Cat bite of left upper arm, initial encounter     Discharge Medication List as of 05/19/2016  9:54 AM    START taking these medications   Details  amoxicillin-clavulanate (AUGMENTIN) 875-125 MG tablet Take 1 tablet by mouth every 12 (twelve) hours., Starting Tue 05/19/2016, Until Fri 05/29/2016, Print        Note: This dictation was prepared with Dragon dictation along with smaller phrase technology. Any transcriptional errors that result from this process are unintentional.    Clinical Course      Marylene Land, NP 05/19/16 1332

## 2016-05-19 NOTE — ED Triage Notes (Signed)
Pt to ed with c/o left arm pain after being bit by cat.

## 2016-05-19 NOTE — ED Notes (Signed)
See triage note  States she was bitten by her own cat last pm 2 small bite marks are noted to left forearm with some swelling noted  No red streaks or drainage noted

## 2016-05-19 NOTE — Discharge Instructions (Signed)
Take medication as prescribed. Rest. Drink plenty of fluids.  ° °Follow up with your primary care physician this week as needed. Return to Urgent care for new or worsening concerns.  ° °

## 2016-05-20 ENCOUNTER — Ambulatory Visit (INDEPENDENT_AMBULATORY_CARE_PROVIDER_SITE_OTHER): Payer: BLUE CROSS/BLUE SHIELD | Admitting: Licensed Clinical Social Worker

## 2016-05-20 DIAGNOSIS — F332 Major depressive disorder, recurrent severe without psychotic features: Secondary | ICD-10-CM

## 2016-05-21 NOTE — Progress Notes (Signed)
   THERAPIST PROGRESS NOTE  Session Time: 49  Participation Level: Active  Behavioral Response: CasualAlertDepressed  Type of Therapy: Individual Therapy  Treatment Goals addressed: Coping  Interventions: CBT, Motivational Interviewing, Solution Focused, Family Systems and Reframing  Summary: Erika Williamson is a 40 y.o. female who presents with continued symptoms of her diagnosis. She was able to rate her behavior a 7on a 1-10 scale.  She states that she was unable to get motivated on Sunday.  Discussion of her goals.  Discussed  Her current stressors and worry.  Discussion of activities that she enjoys to assist with motivation and reduce isolation.  She reports that she enjoys projects (making bird baths, etc). Reports that she worked 6 days last week and is unable to work that may days consecutively.  Discussion on how to communicate that to her supervisor.     Suicidal/Homicidal: Nowithout intent/plan  Therapist Response: LCSW provided Patient with ongoing emotional support and encouragement.  Normalized her feelings.  Commended Patient on her progress and reinforced the importance of client staying focused on her own strengths and resources and resiliency. Processed various strategies for dealing with stressors.    Plan: Return again in 1 weeks.  Diagnosis: Axis I: Major Depression, Recurrent severe    Axis II: No diagnosis    Lubertha South, LCSW 05/21/2016

## 2016-05-22 ENCOUNTER — Emergency Department: Payer: BLUE CROSS/BLUE SHIELD

## 2016-05-22 ENCOUNTER — Encounter: Payer: Self-pay | Admitting: Emergency Medicine

## 2016-05-22 ENCOUNTER — Emergency Department
Admission: EM | Admit: 2016-05-22 | Discharge: 2016-05-22 | Disposition: A | Discharge status: Home / Self Care | Payer: BLUE CROSS/BLUE SHIELD | Attending: Emergency Medicine | Admitting: Emergency Medicine

## 2016-05-22 DIAGNOSIS — W5501XA Bitten by cat, initial encounter: Secondary | ICD-10-CM | POA: Insufficient documentation

## 2016-05-22 DIAGNOSIS — F17210 Nicotine dependence, cigarettes, uncomplicated: Secondary | ICD-10-CM | POA: Insufficient documentation

## 2016-05-22 DIAGNOSIS — Y939 Activity, unspecified: Secondary | ICD-10-CM | POA: Diagnosis not present

## 2016-05-22 DIAGNOSIS — Y999 Unspecified external cause status: Secondary | ICD-10-CM | POA: Insufficient documentation

## 2016-05-22 DIAGNOSIS — Y929 Unspecified place or not applicable: Secondary | ICD-10-CM | POA: Insufficient documentation

## 2016-05-22 DIAGNOSIS — J449 Chronic obstructive pulmonary disease, unspecified: Secondary | ICD-10-CM | POA: Insufficient documentation

## 2016-05-22 DIAGNOSIS — S51852A Open bite of left forearm, initial encounter: Secondary | ICD-10-CM | POA: Diagnosis present

## 2016-05-22 DIAGNOSIS — L03114 Cellulitis of left upper limb: Secondary | ICD-10-CM | POA: Diagnosis not present

## 2016-05-22 LAB — CBC WITH DIFFERENTIAL/PLATELET
BASOS ABS: 0.1 10*3/uL (ref 0–0.1)
BASOS PCT: 1 %
EOS ABS: 0.2 10*3/uL (ref 0–0.7)
EOS PCT: 2 %
HCT: 40.2 % (ref 35.0–47.0)
Hemoglobin: 14.2 g/dL (ref 12.0–16.0)
Lymphocytes Relative: 26 %
Lymphs Abs: 2.5 10*3/uL (ref 1.0–3.6)
MCH: 32.1 pg (ref 26.0–34.0)
MCHC: 35.2 g/dL (ref 32.0–36.0)
MCV: 91.3 fL (ref 80.0–100.0)
Monocytes Absolute: 0.9 10*3/uL (ref 0.2–0.9)
Monocytes Relative: 9 %
Neutro Abs: 5.9 10*3/uL (ref 1.4–6.5)
Neutrophils Relative %: 62 %
PLATELETS: 287 10*3/uL (ref 150–440)
RBC: 4.4 MIL/uL (ref 3.80–5.20)
RDW: 13 % (ref 11.5–14.5)
WBC: 9.6 10*3/uL (ref 3.6–11.0)

## 2016-05-22 LAB — BASIC METABOLIC PANEL
Anion gap: 8 (ref 5–15)
BUN: 7 mg/dL (ref 6–20)
CALCIUM: 9 mg/dL (ref 8.9–10.3)
CO2: 26 mmol/L (ref 22–32)
CREATININE: 0.56 mg/dL (ref 0.44–1.00)
Chloride: 102 mmol/L (ref 101–111)
Glucose, Bld: 94 mg/dL (ref 65–99)
Potassium: 3.9 mmol/L (ref 3.5–5.1)
SODIUM: 136 mmol/L (ref 135–145)

## 2016-05-22 MED ORDER — METRONIDAZOLE 500 MG PO TABS: 500.0000 mg | ORAL_TABLET | Freq: Three times a day (TID) | ORAL | 0 refills | Status: AC

## 2016-05-22 MED ORDER — SULFAMETHOXAZOLE-TRIMETHOPRIM 800-160 MG PO TABS: 1.0000 | ORAL_TABLET | Freq: Two times a day (BID) | ORAL | 0 refills | Status: DC

## 2016-05-22 MED ORDER — PIPERACILLIN-TAZOBACTAM 3.375 G IVPB 30 MIN
3.3750 g | Freq: Once | INTRAVENOUS | Status: AC
  Administered 2016-05-22: 3.375 g via INTRAVENOUS
  Filled 2016-05-22: qty 50

## 2016-05-22 MED ORDER — HYDROCODONE-ACETAMINOPHEN 5-325 MG PO TABS: 1.0000 | ORAL_TABLET | Freq: Four times a day (QID) | ORAL | 0 refills | Status: DC | PRN

## 2016-05-22 NOTE — ED Provider Notes (Signed)
[facility] Emergency Department Provider Note  ____________________________________________  Time seen: Approximately 7:52 AM  I have reviewed the triage vital signs and the nursing notes.   HISTORY  Chief Complaint Animal Bite    HPI Erika Williamson is a 40 y.o. female , NAD, presents to the emergency department with worsening pain and swelling to Bites that were sustained 3 days ago. Patient was seen in this emergency department 3 days ago for follow-up of 2 puncture wound Bite she received from her work. At that time patient declined x-ray evaluation of the wound sites and was placed on Augmentin. Patient states she has been taking her antibiotics as prescribed and without any side effects but notes that the pain about the forearm and redness around the bite sites has been increasing over the last 48 hours. Notes some oozing and weeping from both puncture wounds this morning while in the shower. Has had chills at times but no fevers, body aches, rigors, chest pain, shortness breath, abdominal pain, nausea, vomiting, numbness, weakness, tingling. Denies any further injuries or traumas to the area. Tetanus vaccine is up-to-date within the last 5 years.   Past Medical History:  Diagnosis Date  . 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 Anxiety   . Atypical face pain    Left Sided  . COPD (chronic obstructive pulmonary disease) ([place])   . Depression   . Foot fracture Left X2  . GERD (gastroesophageal reflux disease)   . HPV (human papilloma virus) infection   . Migraine without aura 04/03/2016  . Moderate recurrent major depression ([place]) 07/22/2015  . Shingles   . Sinusitis   . Tension headache   . Tobacco abuse   . Tonsillitis   . Vitamin D deficiency disease     Patient Active Problem List   Diagnosis Date Noted  . Chronic obstructive pulmonary disease ([place]) 05/04/2016  . Contact dermatitis  due to [place] 04/03/2016  . Migraine without aura 04/03/2016  . NSAIDs adverse reaction 09/09/2015  . Moderate recurrent major depression ([place]) 07/22/2015  . Gluten intolerance 06/19/2015  . Tobacco abuse   . Vitamin D deficiency disease   . HPV (human papilloma virus) infection   . High grade squamous intraepithelial cervical dysplasia   . GERD (gastroesophageal reflux disease)     Past Surgical History:  Procedure Laterality Date  . CESAREAN SECTION     X2  . CHOLECYSTECTOMY     Dr Burt Knack  . COLONOSCOPY WITH PROPOFOL N/A 05/03/2015   Procedure: COLONOSCOPY WITH PROPOFOL;  Surgeon: Josefine Class, MD;  Location: [facility] ENDOSCOPY;  Service: Endoscopy;  Laterality: N/A;  . ESOPHAGOGASTRODUODENOSCOPY N/A 05/03/2015   Procedure: ESOPHAGOGASTRODUODENOSCOPY (EGD);  Surgeon: Josefine Class, MD;  Location: [facility] ENDOSCOPY;  Service: Endoscopy;  Laterality: N/A;  . 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 TONSILLECTOMY    . TUBAL LIGATION    . WISDOM TOOTH EXTRACTION      Prior to Admission medications   Medication Sig Start Date End Date Taking? Authorizing Provider  ALPRAZolam (XANAX) 0.25 MG tablet Take 1 tablet (0.25 mg total) by mouth at bedtime as needed for anxiety. 05/04/16   Rainey Pines, MD  Cholecalciferol (VITAMIN D-1000 MAX ST) 1000 UNITS tablet Take 1,000 Units by mouth daily.     Historical Provider, MD  escitalopram (LEXAPRO) 10 MG tablet Take 1.5 tablets (15 mg total) by mouth daily. Pt has supply  05/04/16   Rainey Pines, MD  escitalopram (LEXAPRO) 10 MG tablet Take 1.5 tablets (15 mg total) by mouth daily. 05/04/16   Rainey Pines, MD  esomeprazole (NEXIUM) 20 MG capsule Take 20 mg by mouth daily at 12 noon.    Historical Provider, MD  HYDROcodone-acetaminophen (NORCO) 5-325 MG tablet Take 1 tablet by mouth every 6 (six) hours as needed for severe pain. 05/22/16   Shiraz Bastyr L Daquarius Dubeau, PA-C  lamoTRIgine (LAMICTAL) 25 MG tablet Take 2 tablets (50 mg total) by mouth daily. 05/04/16    Rainey Pines, MD  levonorgestrel (MIRENA) 20 MCG/24HR IUD 1 each by Intrauterine route once.    Historical Provider, MD  metroNIDAZOLE (FLAGYL) 500 MG tablet Take 1 tablet (500 mg total) by mouth 3 (three) times daily. 05/22/16 05/29/16  Annmargaret Decaprio L Oneka Parada, PA-C  Misc Natural Products (TURMERIC CURCUMIN) CAPS Take 500 capsules by mouth.    Historical Provider, MD  Multiple Vitamin (MULTIVITAMIN) capsule Take 1 capsule by mouth daily.    Historical Provider, MD  rizatriptan (MAXALT) 10 MG tablet  03/14/15   Historical Provider, MD  sulfamethoxazole-trimethoprim (BACTRIM DS,SEPTRA DS) 800-160 MG tablet Take 1 tablet by mouth 2 (two) times daily. 05/22/16   Christoph Copelan L Williemae Muriel, PA-C  valACYclovir (VALTREX) 1000 MG tablet Two at onset of blisters, then two more pills 12 hours later 08/23/15   Arnetha Courser, MD    Allergies Gabapentin; Ivp dye [iodinated diagnostic agents]; Acyclovir and related; Compazine [prochlorperazine edisylate]; Erythromycin; Levaquin [levofloxacin in d5w]; Levofloxacin; Naprosyn [naproxen]; Nsaids; Pristiq [desvenlafaxine]; Tramadol; and Ultram [tramadol hcl]  Family History  Problem Relation Age of Onset  . Diabetes Mother   . Hypertension Mother   . COPD Mother   . Stroke Mother   . Osteoporosis Mother   . Depression Mother   . Cancer Father 28    colorectal, spread to lung  . Alcohol abuse Father   . Hypertension Father   . Depression Father   . Diabetes Sister   . COPD Sister   . Anxiety disorder Sister   . Depression Sister   . Fibromyalgia Sister   . Asthma Sister   . ADD / ADHD Son   . ADD / ADHD Daughter     Social History Social History  Substance Use Topics  . Smoking status: Current Every Day Smoker    Packs/day: 1.00    Years: 22.00    Types: Cigarettes    Start date: 09/03/1993  . Smokeless tobacco: Never Used  . Alcohol use No     Review of Systems Constitutional: Positive chills but no fevers, rigors, fatigue Eyes: No visual changes.  ENT: No  swelling about throat/lips/tongue, difficulty swallowing. Cardiovascular: No chest pain. Respiratory:  No shortness of breath. No wheezing.  Gastrointestinal: No abdominal pain.  No nausea, vomiting.   Musculoskeletal: Negative for general myalgias.  Skin: Positive animal bite 2 on the left forearm. Positive oozing, weeping, redness, swelling about the bite sites with pain. Negative for rash. Neurological: Negative for headaches, focal weakness or numbness. No tingling 10-point ROS otherwise negative.  ____________________________________________   PHYSICAL EXAM:  VITAL SIGNS: ED Triage Vitals  Enc Vitals Group     BP 05/22/16 0749 119/88     Pulse Rate 05/22/16 0749 76     Resp 05/22/16 0749 18     Temp 05/22/16 0749 98.6 F (37 C)     Temp Source 05/22/16 0749 Oral     SpO2 05/22/16 0749 99 %  Weight --      Height --      Head Circumference --      Peak Flow --      Pain Score 05/22/16 0742 4     Pain Loc --      Pain Edu? --      Excl. in [place]? --      Constitutional: Alert and oriented. Well appearing and in no acute distress. Eyes: Conjunctivae are normal. PERRL. EOMI without pain.  Head: Atraumatic. Neck: Supple with full range of motion. Hematological/Lymphatic/Immunilogical: No cervical, supraclavicular, axillary lymphadenopathy. Cardiovascular: Normal rate, regular rhythm. Normal S1 and S2. No murmurs, rubs, gallops. Good peripheral circulation with 2+ pulses noted in the left upper extremity. Capillary refill is brisk in all digits of left hand. Respiratory: Normal respiratory effort without tachypnea or retractions. Lungs CTAB with breath sounds noted in all lung fields. No wheeze, rhonchi, rales. Musculoskeletal: Full range of motion of the left upper extremity but pain with movement of the left wrist and pronation and supination of the left forearm. No bony abnormalities to palpation about the left upper extremity and no joint effusions noted about the left  elbow or wrist. No crepitus to palpation of these areas. No edema is noted about the left upper extremity. Grip strength of bilateral hands is 5 out of 5. Neurologic:  Normal speech and language. No gross focal neurologic deficits are appreciated.  Skin:  2 puncture wounds noted about the distal half of the left forearm. Puncture wound noted about the medial portion of the left forearm with a 3 mm scabbed central punctate lesion with approximately 1 cm of surrounding erythema that is mildly indurated but no fluctuance is noted. Area is exquisitely tender to palpation. Patient has noted oozing and weeping from both puncture wounds this morning but no active oozing or weeping is noted at this time. Skin is warm, dry. No rash noted. Psychiatric: Mood and affect are normal. Speech and behavior are normal. Patient exhibits appropriate insight and judgement.   ____________________________________________   LABS (all labs ordered are listed, but only abnormal results are displayed)  Labs Reviewed  BASIC METABOLIC PANEL  CBC WITH DIFFERENTIAL/PLATELET   ____________________________________________  EKG  None ____________________________________________  RADIOLOGY I have personally viewed and evaluated these images (plain radiographs) as part of my medical decision making, as well as reviewing the written report by the radiologist.  Dg Forearm Left  Result Date: 05/22/2016 CLINICAL DATA:  Cat bite EXAM: LEFT FOREARM - 2 VIEW COMPARISON:  None. FINDINGS: Frontal and lateral views were obtained. There is no soft tissue air or radiopaque foreign body. No fracture or dislocation. The joint spaces appear normal. No erosive change or bony destruction. No abnormal periosteal reaction. There is a degree of soft tissue swelling of the distal forearm region. IMPRESSION: No bony abnormality. No soft tissue air or radiopaque foreign body. There is a degree of soft tissue swelling in the distal forearm region.  Electronically Signed   By: Lowella Grip III M.D.   On: 05/22/2016 08:13    ____________________________________________    PROCEDURES  Procedure(s) performed: None   Procedures   Medications  piperacillin-tazobactam (ZOSYN) IVPB 3.375 g (3.375 g Intravenous New Bag/Given 05/22/16 0809)     ____________________________________________   INITIAL IMPRESSION / ASSESSMENT AND PLAN / ED COURSE  Pertinent labs & imaging results that were available during my care of the patient were reviewed by me and considered in my medical decision making (see chart for details).  Clinical Course    Patient's diagnosis is consistent with Cellulitis of left forearm due to cat bite. No evidence of systemic infection based on lab work and x-ray showed no infiltration into bony regions of the forearm. Patient tolerated IV Zosyn without any immediate side effects. Patient will be discharged home with prescriptions for Flagyl and Bactrim to take as directed as well as Norco to take as needed for severe pain. Patient is advised to discontinue use of Augmentin. Patient is in agreement with the current treatment plan. Patient is to follow up with her primary care provider in 48 hours for wound recheck. Patient is given ED precautions to return to the ED for any worsening or new symptoms.      ____________________________________________  FINAL CLINICAL IMPRESSION(S) / ED DIAGNOSES  Final diagnoses:  Cellulitis of left forearm  Cat bite, initial encounter      NEW MEDICATIONS STARTED DURING THIS VISIT:  New Prescriptions   HYDROCODONE-ACETAMINOPHEN (NORCO) 5-325 MG TABLET    Take 1 tablet by mouth every 6 (six) hours as needed for severe pain.   METRONIDAZOLE (FLAGYL) 500 MG TABLET    Take 1 tablet (500 mg total) by mouth 3 (three) times daily.   SULFAMETHOXAZOLE-TRIMETHOPRIM (BACTRIM DS,SEPTRA DS) 800-160 MG TABLET    Take 1 tablet by mouth 2 (two) times daily.         Braxton Feathers,  PA-C 05/22/16 [id number]    Harvest Dark, MD 05/22/16 [phone]

## 2016-05-22 NOTE — ED Triage Notes (Signed)
Pt with cat bite to right arm. Was started on antibiotics on Tuesday but is not getting any better.

## 2016-05-27 ENCOUNTER — Ambulatory Visit (INDEPENDENT_AMBULATORY_CARE_PROVIDER_SITE_OTHER): Payer: BLUE CROSS/BLUE SHIELD | Admitting: Licensed Clinical Social Worker

## 2016-05-27 DIAGNOSIS — F332 Major depressive disorder, recurrent severe without psychotic features: Secondary | ICD-10-CM | POA: Diagnosis not present

## 2016-06-01 ENCOUNTER — Ambulatory Visit (INDEPENDENT_AMBULATORY_CARE_PROVIDER_SITE_OTHER): Payer: BLUE CROSS/BLUE SHIELD | Admitting: Psychiatry

## 2016-06-01 ENCOUNTER — Encounter: Payer: Self-pay | Admitting: Psychiatry

## 2016-06-01 VITALS — BP 121/79 | HR 75 | Temp 98.7°F | Ht 63.5 in | Wt 181.0 lb

## 2016-06-01 DIAGNOSIS — F609 Personality disorder, unspecified: Secondary | ICD-10-CM | POA: Diagnosis not present

## 2016-06-01 DIAGNOSIS — F3160 Bipolar disorder, current episode mixed, unspecified: Secondary | ICD-10-CM

## 2016-06-01 MED ORDER — ESCITALOPRAM OXALATE 10 MG PO TABS: 15.0000 mg | ORAL_TABLET | Freq: Every day | ORAL | 1 refills | Status: DC

## 2016-06-01 MED ORDER — LAMOTRIGINE 100 MG PO TABS: 100.0000 mg | ORAL_TABLET | Freq: Every day | ORAL | 1 refills | Status: DC

## 2016-06-01 NOTE — Progress Notes (Signed)
Psychiatric MD Progress Note   Patient Identification: Erika Williamson MRN:  [id number] Date of Evaluation:  06/01/2016 Referral Source: IOP Chief Complaint:   Chief Complaint    Follow-up     Visit Diagnosis:    ICD-9-CM ICD-10-CM   1. Bipolar I disorder, most recent episode mixed ([place]) 296.60 F31.60   2. Personality disorder 301.9 F60.9     History of Present Illness:  Erika Williamson Is a 40 year old female who presented for the follow-up appointment. She reported that she has started improving on her medications. However she reported that she started having worsening of her mood symptoms before her menstrual cycle. She reported that she became agitated and was having suicidal ideations last week. Once her menstrual cycle  start she becomes normal. She reported that she wants to have her medications adjusted. 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 Patient currently denied having any mood symptoms anger anxiety or suicidal ideations. She reported that the combination of the medication has been helping her. She has also been compliant with her therapy appointments on a weekly basis. She reported that she is planning  to painting her son's room and is motivated. She stated that she has found her hobby and it gets her going. She appeared calm and collected during the interview. She denied having any adverse reactions from her medications.         Associated Signs/Symptoms: Depression Symptoms:  depressed mood, psychomotor agitation, anxiety, (Hypo) Manic Symptoms:  Distractibility, Impulsivity, Irritable Mood, Labiality of Mood, Anxiety Symptoms:  Excessive Worry, Psychotic Symptoms:  none PTSD Symptoms: Negative  Past Psychiatric History: inpatient after overdose aged 77, otherwise no issues till current episode when she began therapy.  Has dealt with depression by covering it with over involvement over the years.  Previous Psychotropic Medications: Yes    Substance Abuse History in the last 12 months:  No.  Consequences of Substance Abuse: Negative  Past Medical History:  Past Medical History:  Diagnosis Date  . 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 Anxiety   . Atypical face pain    Left Sided  . COPD (chronic obstructive pulmonary disease) ([place])   . Depression   . Foot fracture Left X2  . GERD (gastroesophageal reflux disease)   . HPV (human papilloma virus) infection   . Migraine without aura 04/03/2016  . Moderate recurrent major depression ([place]) 07/22/2015  . Shingles   . Sinusitis   . Tension headache   . Tobacco abuse   . Tonsillitis   . Vitamin D deficiency disease     Past Surgical History:  Procedure Laterality Date  . CESAREAN SECTION     X2  . CHOLECYSTECTOMY     Dr Burt Knack  . COLONOSCOPY WITH PROPOFOL N/A 05/03/2015   Procedure: COLONOSCOPY WITH PROPOFOL;  Surgeon: Josefine Class, MD;  Location: [facility] ENDOSCOPY;  Service: Endoscopy;  Laterality: N/A;  . ESOPHAGOGASTRODUODENOSCOPY N/A 05/03/2015   Procedure: ESOPHAGOGASTRODUODENOSCOPY (EGD);  Surgeon: Josefine Class, MD;  Location: [facility] ENDOSCOPY;  Service: Endoscopy;  Laterality: N/A;  . 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 TONSILLECTOMY    . TUBAL LIGATION    . WISDOM TOOTH EXTRACTION      Family Psychiatric History: father alcoholic, sister depressed  Family History:  Family History  Problem Relation Age of Onset  . Diabetes Mother   . Hypertension Mother   . COPD Mother   .  Stroke Mother   . Osteoporosis Mother   . Depression Mother   . Cancer Father 73    colorectal, spread to lung  . Alcohol abuse Father   . Hypertension Father   . Depression Father   . Diabetes Sister   . COPD Sister   . Anxiety disorder Sister   . Depression Sister   . Fibromyalgia Sister   . Asthma Sister   . ADD / ADHD Son   . ADD / ADHD Daughter     Social History:   Social History   Social History   . Marital status: Married    Spouse name: N/A  . Number of children: N/A  . Years of education: N/A   Social History Main Topics  . Smoking status: Current Every Day Smoker    Packs/day: 1.00    Years: 22.00    Types: Cigarettes    Start date: 09/03/1993  . Smokeless tobacco: Never Used  . Alcohol use No  . Drug use: No  . Sexual activity: Yes   Other Topics Concern  . None   Social History Narrative  . None    Additional Social History: mother was terminal for many years when she was growing up and father was alcoholic till he died  Allergies:   Allergies  Allergen Reactions  . Gabapentin Anaphylaxis  . Ivp Dye [Iodinated Diagnostic Agents]   . Acyclovir And Related   . Compazine [Prochlorperazine Edisylate]   . Erythromycin   . Levaquin [Levofloxacin In D5w]   . Levofloxacin Other (See Comments)  . Naprosyn [Naproxen]   . Nsaids   . Pristiq [Desvenlafaxine]   . Tramadol Other (See Comments)  . Ultram [Tramadol Hcl]     Metabolic Disorder Labs: No results found for: HGBA1C, MPG No results found for: PROLACTIN No results found for: CHOL, TRIG, HDL, CHOLHDL, VLDL, LDLCALC   Current Medications: Current Outpatient Prescriptions  Medication Sig Dispense Refill  . ALPRAZolam (XANAX) 0.25 MG tablet Take 1 tablet (0.25 mg total) by mouth at bedtime as needed for anxiety. 30 tablet 0  . Cholecalciferol (VITAMIN D-1000 MAX ST) 1000 UNITS tablet Take 1,000 Units by mouth daily.     Marland Kitchen escitalopram (LEXAPRO) 10 MG tablet Take 1.5 tablets (15 mg total) by mouth daily. Pt has supply 30 tablet 1  . esomeprazole (NEXIUM) 20 MG capsule Take 20 mg by mouth daily at 12 noon.    Marland Kitchen HYDROcodone-acetaminophen (NORCO) 5-325 MG tablet Take 1 tablet by mouth every 6 (six) hours as needed for severe pain. 6 tablet 0  . lamoTRIgine (LAMICTAL) 100 MG tablet Take 1 tablet (100 mg total) by mouth daily. 30 tablet 1  . levonorgestrel (MIRENA) 20 MCG/24HR IUD 1 each by Intrauterine route  once.    . Misc Natural Products (TURMERIC CURCUMIN) CAPS Take 500 capsules by mouth.    . Multiple Vitamin (MULTIVITAMIN) capsule Take 1 capsule by mouth daily.    . rizatriptan (MAXALT) 10 MG tablet     . sulfamethoxazole-trimethoprim (BACTRIM DS,SEPTRA DS) 800-160 MG tablet Take 1 tablet by mouth 2 (two) times daily. 14 tablet 0  . valACYclovir (VALTREX) 1000 MG tablet Two at onset of blisters, then two more pills 12 hours later 4 tablet 3   No current facility-administered medications for this visit.     Neurologic: Headache: Yes Seizure: Negative Paresthesias:No  Musculoskeletal: Strength & Muscle Tone: within normal limits Gait & Station: normal Patient leans: N/A  Psychiatric Specialty Exam: Review of Systems  Psychiatric/Behavioral: Positive for depression. The patient is nervous/anxious.   All other systems reviewed and are negative.   Blood pressure 121/79, pulse 75, temperature 98.7 F (37.1 C), temperature source Oral, height 5' 3.5" (1.613 m), weight 181 lb (82.1 kg), last menstrual period 05/24/2016.Body mass index is 31.56 kg/m.  General Appearance: Well Groomed  Eye Contact:  Good  Speech:  Clear and Coherent  Volume:  Normal  Mood:  Anxious  Affect:  Congruent  Thought Process:  Goal Directed  Orientation:  Full (Time, Place, and Person)  Thought Content:  WDL and Rumination  Suicidal Thoughts:  No  Homicidal Thoughts:  No  Memory:  Immediate;   Good Recent;   Good Remote;   Good  Judgement:  Intact  Insight:  Fair  Psychomotor Activity:  Normal  Concentration:  Good  Recall:  Good  Fund of Knowledge:Good  Language: Good  Akathisia:  Negative  Handed:  Right  AIMS (if indicated):  0  Assets:  Communication Skills Desire for Improvement Financial Resources/Insurance Housing Intimacy Resilience Social Support Talents/Skills Transportation Vocational/Educational  ADL's:  Intact  Cognition: WNL  Sleep:  Too much    Treatment Plan  Summary: Lexapro 15 mg daily  Continue  lamotrigine 100 mg daily to help with her mood stabilization. Advised patient to continue with the therapy appointments.   Advised patient to increase the dose of lamotrigine 150 mg 3 days before her menstrual periods and decrease the dose of Lexapro 10 mg 3 days before her cycle and she demonstrated understanding.  Follow-up in one month or earlier depending on her symptoms    More than 50% of the time spent in psychoeducation, counseling and coordination of care.    This note was generated in part or whole with voice recognition software. Voice regonition is usually quite accurate but there are transcription errors that can and very often do occur. I apologize for any typographical errors that were not detected and corrected.    Rainey Pines, MD 8/14/201710:14 AM

## 2016-06-04 ENCOUNTER — Ambulatory Visit (INDEPENDENT_AMBULATORY_CARE_PROVIDER_SITE_OTHER): Payer: BLUE CROSS/BLUE SHIELD | Admitting: Licensed Clinical Social Worker

## 2016-06-04 DIAGNOSIS — F332 Major depressive disorder, recurrent severe without psychotic features: Secondary | ICD-10-CM | POA: Diagnosis not present

## 2016-06-04 DIAGNOSIS — F609 Personality disorder, unspecified: Secondary | ICD-10-CM | POA: Diagnosis not present

## 2016-06-04 NOTE — Progress Notes (Signed)
   THERAPIST PROGRESS NOTE  Session Time: 4min  Participation Level: Active  Behavioral Response: CasualAlertDepressed  Type of Therapy: Individual Therapy  Treatment Goals addressed: Coping and Diagnosis: Depression  Interventions: CBT, Motivational Interviewing, Solution Focused, Supportive, Family Systems and Reframing  Summary: Anysia Lehane Eugenio is a 40 y.o. female who presents with continued symptoms of her diagnosis.  She was able to list her current worries and stressors.  Patient continues to have difficulty with depressive symptoms and grief/loss.  Patient is upset that her ex-husband is evicting their daughter.  Discussion of coping skills and support system.  She denies having a supportive family. She continues to be short tempered at work. Explored communication skills to assist with talking with her husband.  Discussed safety plan and provided her with Crisis Hotline  Suicidal/Homicidal: Nowithout intent/plan  Therapist Response: LCSW provided Patient with ongoing emotional support and encouragement.  Normalized her feelings.  Commended Patient on her progress and reinforced the importance of client staying focused on her own strengths and resources and resiliency. Processed various strategies for dealing with stressors.    Plan: Return again in 1 weeks.  Diagnosis: Axis I: Major Depression, Recurrent severe    Axis II: No diagnosis    Lubertha South, LCSW 05/14/2016

## 2016-06-12 ENCOUNTER — Ambulatory Visit (INDEPENDENT_AMBULATORY_CARE_PROVIDER_SITE_OTHER): Payer: BLUE CROSS/BLUE SHIELD | Admitting: Licensed Clinical Social Worker

## 2016-06-12 DIAGNOSIS — F332 Major depressive disorder, recurrent severe without psychotic features: Secondary | ICD-10-CM

## 2016-06-15 ENCOUNTER — Ambulatory Visit (INDEPENDENT_AMBULATORY_CARE_PROVIDER_SITE_OTHER): Payer: BLUE CROSS/BLUE SHIELD | Admitting: Family Medicine

## 2016-06-15 ENCOUNTER — Encounter: Payer: Self-pay | Admitting: Family Medicine

## 2016-06-15 DIAGNOSIS — W5501XD Bitten by cat, subsequent encounter: Secondary | ICD-10-CM | POA: Diagnosis not present

## 2016-06-15 DIAGNOSIS — S51852D Open bite of left forearm, subsequent encounter: Secondary | ICD-10-CM

## 2016-06-15 DIAGNOSIS — F331 Major depressive disorder, recurrent, moderate: Secondary | ICD-10-CM | POA: Diagnosis not present

## 2016-06-15 DIAGNOSIS — G43009 Migraine without aura, not intractable, without status migrainosus: Secondary | ICD-10-CM | POA: Diagnosis not present

## 2016-06-15 DIAGNOSIS — Z720 Tobacco use: Secondary | ICD-10-CM

## 2016-06-15 NOTE — Progress Notes (Signed)
BP 122/84   Pulse 77   Temp 99 F (37.2 C) (Oral)   Resp 14   Wt 180 lb 11.2 oz (82 kg)   LMP 06/15/2016   SpO2 97%   BMI 31.51 kg/m    Subjective:    Patient ID: Erika Williamson, female    DOB: 02/22/1976, 40 y.o.   MRN: [id number]  HPI: Erika Williamson is a 40 y.o. female  Chief Complaint  Patient presents with  . Follow-up   She is going to therapy every week; trying to get medicine right with psychiatrist, Dr. Gretel Acre, seeing her monthly Will be seeing Dr. Marcelline Mates coming up soon The few days before her starts her periods, she'll get mean; having her period every 3 weeks Dr. Gretel Acre is aware of the cycling, and she's lowered the Lexapro down to 10 mg and incresed the lamictal to 150 mg for the four days before her period; then back to 15 mg Lexapro and 100 mg of lamictal  Had mirena put in and still has migraines; she thinks hormones are deeply tied to all this  Dr. Manuella Ghazi thinks she has arthritis in her neck; has occipital neuralgia and that can trigger a migraine; no aura  She went to the ER because her cat "bit the shit out of me" she says; August 3rd was the injury; right forearm, they treated her with augmentin; worse by Friday; she went back to the ER and they did an xray; it got into the tendons and they gave her IV antibiotics; healing now and scab just came off  She has started painting her house inside; smoking less with her hobby; it's gorgeous; going to make curtains; will redo the trim, very therapeutic, can work until 7:30 pm and still sleep; has been having trouble with sleep though; weird dreams, sleep-walking; one story house; dogs sleep on either side of her  Depression screen [facility] 2/9 06/15/2016 04/03/2016 08/07/2015 07/31/2015 07/22/2015  Decreased Interest 2 0 2 3 3   Down, Depressed, Hopeless 2 3 2 3 3   PHQ - 2 Score 4 3 4 6 6   Altered sleeping 3 3 0 0 1  Tired, decreased energy 2 3 2 3 3   Change in appetite 2 3 3 3 3   Feeling bad or failure  about yourself  2 0 2 3 3   Trouble concentrating 1 3 0 3 3  Moving slowly or fidgety/restless 0 1 1 0 0  Suicidal thoughts 1 0 1 3 3   PHQ-9 Score 15 16 13 21 22   Difficult doing work/chores Very difficult Extremely dIfficult Not difficult at all Very difficult -  Some encounter information is confidential and restricted. Go to Review Flowsheets activity to see all data.  patient says Dr. Gretel Acre aware of her suicidal ideation  Relevant past medical, surgical, family and social history reviewed Past Medical History:  Diagnosis Date  . 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 Anxiety   . Atypical face pain    Left Sided  . COPD (chronic obstructive pulmonary disease) ([place])   . Depression   . Foot fracture Left X2  . GERD (gastroesophageal reflux disease)   . HPV (human papilloma virus) infection   . Migraine without aura 04/03/2016  . Moderate recurrent major depression ([place]) 07/22/2015  . Shingles   . Sinusitis   . Tension headache   . Tobacco abuse   . Tonsillitis   . Vitamin D deficiency  disease    Past Surgical History:  Procedure Laterality Date  . CESAREAN SECTION     X2  . CHOLECYSTECTOMY     Dr Burt Knack  . COLONOSCOPY WITH PROPOFOL N/A 05/03/2015   Procedure: COLONOSCOPY WITH PROPOFOL;  Surgeon: Josefine Class, MD;  Location: [facility] ENDOSCOPY;  Service: Endoscopy;  Laterality: N/A;  . ESOPHAGOGASTRODUODENOSCOPY N/A 05/03/2015   Procedure: ESOPHAGOGASTRODUODENOSCOPY (EGD);  Surgeon: Josefine Class, MD;  Location: [facility] ENDOSCOPY;  Service: Endoscopy;  Laterality: N/A;  . 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 TONSILLECTOMY    . TUBAL LIGATION    . WISDOM TOOTH EXTRACTION     Family History  Problem Relation Age of Onset  . Diabetes Mother   . Hypertension Mother   . COPD Mother   . Stroke Mother   . Osteoporosis Mother   . Depression Mother   . Cancer Father 78    colorectal, spread to lung  . Alcohol abuse  Father   . Hypertension Father   . Depression Father   . Diabetes Sister   . COPD Sister   . Anxiety disorder Sister   . Depression Sister   . Fibromyalgia Sister   . Asthma Sister   . ADD / ADHD Son   . ADD / ADHD Daughter    Social History  Substance Use Topics  . Smoking status: Current Every Day Smoker    Packs/day: 1.00    Years: 22.00    Types: Cigarettes    Start date: 09/03/1993  . Smokeless tobacco: Never Used  . Alcohol use No   Interim medical history since last visit reviewed. Allergies and medications reviewed  Review of Systems Per HPI unless specifically indicated above     Objective:    BP 122/84   Pulse 77   Temp 99 F (37.2 C) (Oral)   Resp 14   Wt 180 lb 11.2 oz (82 kg)   LMP 06/15/2016   SpO2 97%   BMI 31.51 kg/m   Wt Readings from Last 3 Encounters:  06/15/16 180 lb 11.2 oz (82 kg)  06/01/16 181 lb (82.1 kg)  05/19/16 180 lb (81.6 kg)    Physical Exam  Constitutional: She appears well-developed and well-nourished.  HENT:  Mouth/Throat: Mucous membranes are normal.  Eyes: EOM are normal. No scleral icterus.  Cardiovascular: Normal rate and regular rhythm.   Pulmonary/Chest: Effort normal and breath sounds normal.  Skin: Lesion (erythema at what is consistent with previous bite mark; no fluctuance, no red streaks; no swelling) noted.  Psychiatric: She has a normal mood and affect. Her behavior is normal. Her mood appears not anxious. Her speech is not rapid and/or pressured. She does not exhibit a depressed mood.  Good eye contact with examiner   Results for orders placed or performed during the hospital encounter of [id number]  Basic metabolic panel  Result Value Ref Range   Sodium 136 135 - 145 mmol/L   Potassium 3.9 3.5 - 5.1 mmol/L   Chloride 102 101 - 111 mmol/L   CO2 26 22 - 32 mmol/L   Glucose, Bld 94 65 - 99 mg/dL   BUN 7 6 - 20 mg/dL   Creatinine, Ser 0.56 0.44 - 1.00 mg/dL   Calcium 9.0 8.9 - 10.3 mg/dL   GFR calc non Af  Amer >60 >60 mL/min   GFR calc Af Amer >60 >60 mL/min   Anion gap 8 5 - 15  CBC with Differential  Result Value Ref  Range   WBC 9.6 3.6 - 11.0 K/uL   RBC 4.40 3.80 - 5.20 MIL/uL   Hemoglobin 14.2 12.0 - 16.0 g/dL   HCT 40.2 35.0 - 47.0 %   MCV 91.3 80.0 - 100.0 fL   MCH 32.1 26.0 - 34.0 pg   MCHC 35.2 32.0 - 36.0 g/dL   RDW 13.0 11.5 - 14.5 %   Platelets 287 150 - 440 K/uL   Neutrophils Relative % 62 %   Neutro Abs 5.9 1.4 - 6.5 K/uL   Lymphocytes Relative 26 %   Lymphs Abs 2.5 1.0 - 3.6 K/uL   Monocytes Relative 9 %   Monocytes Absolute 0.9 0.2 - 0.9 K/uL   Eosinophils Relative 2 %   Eosinophils Absolute 0.2 0 - 0.7 K/uL   Basophils Relative 1 %   Basophils Absolute 0.1 0 - 0.1 K/uL      Assessment & Plan:   Problem List Items Addressed This Visit      Cardiovascular and Mediastinum   Migraine without aura    Managed by neurologist        Other   Tobacco abuse    Encouraged smoking cessation; glad she has hobby to keep herself busy rather than smoke      Moderate recurrent major depression ([place])    Suggested that she talk with either psychiatrist or gynecologist as to whether hormones just before her period or for first few days of her period might ameliorate some of her cyclic depression; I do not know and just throw that out as a suggestion to ask about      Cat bite of forearm    Seen in ER; finish out antibiotics; warned about risk of C diff, eat yogurt or take probiotics; see AVS; tetanus UTD       Other Visit Diagnoses   None.     Follow up plan: Return in about 3 months (around 09/15/2016) for follow-up.  An after-visit summary was printed and given to the patient at [address].  Please see the patient instructions which may contain other information and recommendations beyond what is mentioned above in the assessment and plan.  No orders of the defined types were placed in this encounter.   No orders of the defined types were placed in this  encounter.

## 2016-06-15 NOTE — Patient Instructions (Addendum)
I am not recommending this myself, but ask Dr. Marcelline Mates and/or Dr. Gretel Acre is cyclic estrogen right before or during your period might be helpful before your period  Please do eat yogurt daily or take a probiotic daily for the next month or two We want to replace the healthy germs in the gut If you notice foul, watery diarrhea in the next two months, schedule an appointment RIGHT AWAY  I'll recommend that your husband move your car keys and leave you a note as to the location so you don't pick them up and drive in your sleep; the keys should not be in a routine place where you could just pick them up and go  Continue the vitamin D 1000 iu daily  I do recommend regular exercise; start low and build up gradually until you are exercising 150 minutes per week  Avoid sweets and anything made with white sugar for one month If sweetener needed for coffee, try agave syrup or artificial sweetener for the next month

## 2016-06-15 NOTE — Progress Notes (Signed)
   THERAPIST PROGRESS NOTE  Session Time: 58min  Participation Level: Active  Behavioral Response: CasualAlertDepressed  Type of Therapy: Individual Therapy  Treatment Goals addressed: Coping and Diagnosis: Depression  Interventions: CBT, Motivational Interviewing, Solution Focused, Supportive and Reframing  Summary: Erika Williamson is a 40 y.o. female who presents with continued symptoms of her diagnosis.  She was able to vent her frustration and stressors since the previous session.  She reports that she struggles to get out of the bed and motivate herself.  She reports that she has minimal support.  She reports that her husband does not communicate with her which is stressing her out. Therapist prompted Patient to discuss her support system and ways that she manages her daily stress, anger, and frustrations.  Therapist and Patient discussed education, long term goals and short term goals. Therapist challenged Patient to go beyond her previous thought patterns as would continuously limit herself throughout the discussion. Therapist encouraged Patient to focus on her strengths and the positive aspects of her life versus the opposite.    Suicidal/Homicidal: Nowithout intent/plan  Therapist Response: LCSW provided Patient with ongoing emotional support and encouragement.  Normalized her feelings.  Commended Patient on her progress and reinforced the importance of client staying focused on her own strengths and resources and resiliency. Processed various strategies for dealing with stressors.    Plan: Return again in 1 weeks.  Diagnosis: Axis I: Major Depression, Recurrent severe    Axis II: No diagnosis    Lubertha South, LCSW 05/27/2016

## 2016-06-17 ENCOUNTER — Ambulatory Visit (INDEPENDENT_AMBULATORY_CARE_PROVIDER_SITE_OTHER): Payer: BLUE CROSS/BLUE SHIELD | Admitting: Licensed Clinical Social Worker

## 2016-06-17 DIAGNOSIS — F332 Major depressive disorder, recurrent severe without psychotic features: Secondary | ICD-10-CM

## 2016-06-17 DIAGNOSIS — W5501XA Bitten by cat, initial encounter: Secondary | ICD-10-CM

## 2016-06-17 DIAGNOSIS — S51859A Open bite of unspecified forearm, initial encounter: Secondary | ICD-10-CM | POA: Insufficient documentation

## 2016-06-17 NOTE — Assessment & Plan Note (Signed)
Suggested that she talk with either psychiatrist or gynecologist as to whether hormones just before her period or for first few days of her period might ameliorate some of her cyclic depression; I do not know and just throw that out as a suggestion to ask about

## 2016-06-17 NOTE — Assessment & Plan Note (Signed)
Managed by neurologist

## 2016-06-17 NOTE — Assessment & Plan Note (Signed)
Seen in ER; finish out antibiotics; warned about risk of C diff, eat yogurt or take probiotics; see AVS; tetanus UTD

## 2016-06-17 NOTE — Assessment & Plan Note (Signed)
Encouraged smoking cessation; glad she has hobby to keep herself busy rather than smoke

## 2016-06-23 NOTE — Progress Notes (Signed)
   THERAPIST PROGRESS NOTE  Session Time: 80min  Participation Level: Active  Behavioral Response: NeatAlertDepressed  Type of Therapy: Individual Therapy  Treatment Goals addressed: Coping and Diagnosis: Depression  Interventions: CBT, Motivational Interviewing and Solution Focused  Summary: Erika Williamson is a 40 y.o. female who presents with symptoms of her diagnosis.   Engaged Patient in discussion of her current mood and symptoms that she is currently experiencing.  Facilitated a discussion of her current  Daily schedule and how she is able to manage. Discussed her current progress and regression.  Assisted Patient on what is going well in her life.  Shifted focused on medication management.    Suicidal/Homicidal: Nowithout intent/plan  Therapist Response:   LCSW provided Patient with ongoing emotional support and encouragement.  Normalized her feelings.  Commended Patient on her progress and reinforced the importance of client staying focused on her own strengths and resources and resiliency. Processed various strategies for dealing with stressors.   Plan: Return again in2 weeks.  Diagnosis: Axis I: Major Depression, Recurrent severe    Axis II: Borderline Personality Dis.    Lubertha South, LCSW 06/04/2016

## 2016-06-24 ENCOUNTER — Ambulatory Visit: Payer: Self-pay | Admitting: Obstetrics and Gynecology

## 2016-06-25 ENCOUNTER — Ambulatory Visit (INDEPENDENT_AMBULATORY_CARE_PROVIDER_SITE_OTHER): Payer: BLUE CROSS/BLUE SHIELD | Admitting: Licensed Clinical Social Worker

## 2016-06-25 DIAGNOSIS — F332 Major depressive disorder, recurrent severe without psychotic features: Secondary | ICD-10-CM

## 2016-06-25 NOTE — Progress Notes (Signed)
   THERAPIST PROGRESS NOTE  Session Time:76min  Participation Level: Active  Behavioral Response: CasualAlertDepressed  Type of Therapy: Individual Therapy  Treatment Goals addressed: Coping  Interventions: CBT, Motivational Interviewing, Solution Focused and Reframing  Summary: Erika Williamson is a 40 y.o. female who presents with continued symptoms of her depression.  Therapist provided examples as to how past trauma may presently be declared in her life. Therapist explored therapeutic ways to manage her symptoms.  Therapist explained cognitive development to Patient then challenged her thinking using a quick exercise. Therapist continued with cognitive distortions and irrational thoughts. Therapist and Patient reviewed emotions and ways that our thoughts overshadow and influence our emotions.  Therapist prompted Patient to provide an example of a time when her thought process influenced the way she felt. Therapist provided praise as Patient was able to recall a fitting incident for the discussion. Therapist inquired about Patient's current medication regimen.    Suicidal/Homicidal: No  Therapist Response: Provided support for Patient as she discussed her current mood. Stressed the importance of mood stabilization through learned coping skills and medication management.  Encouraged Patient to focus on her strengths and the positive aspects.  Plan: Return again in2 weeks.  Diagnosis: Axis I: Major Depression, Recurrent severe    Axis II: No diagnosis    Lubertha South, LCSW 06/12/2016

## 2016-06-29 NOTE — Progress Notes (Signed)
   THERAPIST PROGRESS NOTE  Session Time: 26min  Participation Level: Active  Behavioral Response: CasualAlertDepressed  Type of Therapy: Individual Therapy  Treatment Goals addressed: Coping and Diagnosis: Depression  Interventions: CBT, Motivational Interviewing and Solution Focused  Summary: Erika Williamson is a 40 y.o. female who presents with symptoms of her depression.  Prompted Patient for her thought's regarding her efforts to stabilize her mental health symptoms over the past week.  Encouraged Patient to build a larger support network. Engaged Patient in the development of effective relaxation techniques to better manage overwhelming feelings and decrease adverse reactive behaviors.  Provided psychoeducation on focused breathing and progressive muscle relaxation via "Topic 8a: Relaxation Techniques" (IMR: Practitioner Guides and Handouts, p.285). Modeled the aforementioned relaxation skills for Patient before engaging her in a cognitive rehearsal of the activities to ensure independent ease of use, comfort, and effectiveness.  Assessed Patient's progress toward goal attainment.     Suicidal/Homicidal: No  Therapist Response: LCSW provided Patient with ongoing emotional support and encouragement.  Normalized her feelings.  Encouraged Patient to build a larger support system and informing her husband of her symptoms and diagnosis.  Assisted with learning relaxation techniques.  Plan: Return again in 1 weeks.  Diagnosis: Axis I: Major Depression, Recurrent severe    Axis II: No diagnosis    Lubertha South, LCSW 06/17/2016

## 2016-07-01 ENCOUNTER — Ambulatory Visit (INDEPENDENT_AMBULATORY_CARE_PROVIDER_SITE_OTHER): Payer: BLUE CROSS/BLUE SHIELD | Admitting: Psychiatry

## 2016-07-01 ENCOUNTER — Encounter: Payer: Self-pay | Admitting: Psychiatry

## 2016-07-01 VITALS — BP 111/77 | HR 78 | Temp 99.0°F | Ht 63.5 in | Wt 179.0 lb

## 2016-07-01 DIAGNOSIS — F3160 Bipolar disorder, current episode mixed, unspecified: Secondary | ICD-10-CM

## 2016-07-01 MED ORDER — LAMOTRIGINE 100 MG PO TABS: 100.0000 mg | ORAL_TABLET | Freq: Every day | ORAL | 1 refills | Status: DC

## 2016-07-01 MED ORDER — PROPRANOLOL HCL 10 MG PO TABS: 10.0000 mg | ORAL_TABLET | Freq: Two times a day (BID) | ORAL | 1 refills | Status: DC

## 2016-07-01 NOTE — Progress Notes (Signed)
Psychiatric MD Progress Note   Patient Identification: Erika Williamson MRN:  [id number] Date of Evaluation:  07/01/2016 Referral Source: IOP Chief Complaint:   Chief Complaint    Follow-up; Medication Refill     Visit Diagnosis:    ICD-9-CM ICD-10-CM   1. Bipolar I disorder, most recent episode mixed ([place]) 296.60 F31.60     History of Present Illness:  Erika Williamson Is a 40 year old female who presented for the follow-up appointment. She reported that she hasHas been eating a lot of sweets for the past few weeks. She has discussed with the primary care physician as she was consuming chocolate cakes and cheesecake by herself. Her PCP advised her not to eat any processed food as her blood sugar was high. She has been monitoring her on a regular basis. Patient reported that she continues to have agitation and more swings. She did not increase the dose of lamotrigine on her menstrual I killed. She reported that she is also having issues with one of her coworkers at [employer]. Patient was discussing in detail about the same. She reported that she is not having any improvement after her therapy. Patient currently denied having any suicidal homicidal ideations or plans. She reported that she is currently experiencing migraine headaches. We discussed about adjusting her medications and she agreed with the plan. She is compliant with her medications. She sleeps well at night. She denied having any perceptual disturbances.   She has been trying to redirect her energy by painting room in her home and stated that she feels happy. She stated that she has a small projects going on around the house as they keep her motivated.    Associated Signs/Symptoms: Depression Symptoms:  depressed mood, psychomotor agitation, fatigue, difficulty concentrating, anxiety, (Hypo) Manic Symptoms:  Distractibility, Impulsivity, Irritable Mood, Labiality of Mood, Anxiety Symptoms:  Excessive Worry, Psychotic  Symptoms:  none PTSD Symptoms: Negative  Past Psychiatric History: inpatient after overdose aged 48, otherwise no issues till current episode when she began therapy.  Has dealt with depression by covering it with over involvement over the years.  Previous Psychotropic Medications: Yes   Substance Abuse History in the last 12 months:  No.  Consequences of Substance Abuse: Negative  Past Medical History:  Past Medical History:  Diagnosis Date  . 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 Anxiety   . Atypical face pain    Left Sided  . COPD (chronic obstructive pulmonary disease) ([place])   . Depression   . Foot fracture Left X2  . GERD (gastroesophageal reflux disease)   . HPV (human papilloma virus) infection   . Migraine without aura 04/03/2016  . Moderate recurrent major depression ([place]) 07/22/2015  . Shingles   . Sinusitis   . Tension headache   . Tobacco abuse   . Tonsillitis   . Vitamin D deficiency disease     Past Surgical History:  Procedure Laterality Date  . CESAREAN SECTION     X2  . CHOLECYSTECTOMY     Dr Burt Knack  . COLONOSCOPY WITH PROPOFOL N/A 05/03/2015   Procedure: COLONOSCOPY WITH PROPOFOL;  Surgeon: Josefine Class, MD;  Location: [facility] ENDOSCOPY;  Service: Endoscopy;  Laterality: N/A;  . ESOPHAGOGASTRODUODENOSCOPY N/A 05/03/2015   Procedure: ESOPHAGOGASTRODUODENOSCOPY (EGD);  Surgeon: Josefine Class, MD;  Location: [facility] ENDOSCOPY;  Service: Endoscopy;  Laterality: N/A;  . 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 TONSILLECTOMY    .  TUBAL LIGATION    . WISDOM TOOTH EXTRACTION      Family Psychiatric History: father alcoholic, sister depressed  Family History:  Family History  Problem Relation Age of Onset  . Diabetes Mother   . Hypertension Mother   . COPD Mother   . Stroke Mother   . Osteoporosis Mother   . Depression Mother   . Cancer Father 36    colorectal, spread to lung  . Alcohol abuse  Father   . Hypertension Father   . Depression Father   . Diabetes Sister   . COPD Sister   . Anxiety disorder Sister   . Depression Sister   . Fibromyalgia Sister   . Asthma Sister   . ADD / ADHD Son   . ADD / ADHD Daughter     Social History:   Social History   Social History  . Marital status: Married    Spouse name: N/A  . Number of children: N/A  . Years of education: N/A   Social History Main Topics  . Smoking status: Current Every Day Smoker    Packs/day: 1.00    Years: 22.00    Types: Cigarettes    Start date: 09/03/1993  . Smokeless tobacco: Never Used  . Alcohol use No  . Drug use: No  . Sexual activity: Yes   Other Topics Concern  . None   Social History Narrative  . None    Additional Social History: mother was terminal for many years when she was growing up and father was alcoholic till he died  Allergies:   Allergies  Allergen Reactions  . Gabapentin Anaphylaxis  . Ivp Dye [Iodinated Diagnostic Agents]   . Acyclovir And Related   . Compazine [Prochlorperazine Edisylate]   . Erythromycin   . Levaquin [Levofloxacin In D5w]   . Levofloxacin Other (See Comments)  . Naprosyn [Naproxen]   . Nsaids   . Pristiq [Desvenlafaxine]   . Tramadol Other (See Comments)  . Ultram [Tramadol Hcl]     Metabolic Disorder Labs: No results found for: HGBA1C, MPG No results found for: PROLACTIN No results found for: CHOL, TRIG, HDL, CHOLHDL, VLDL, LDLCALC   Current Medications: Current Outpatient Prescriptions  Medication Sig Dispense Refill  . ALPRAZolam (XANAX) 0.25 MG tablet Take 1 tablet (0.25 mg total) by mouth at bedtime as needed for anxiety. 30 tablet 0  . Cholecalciferol (VITAMIN D-1000 MAX ST) 1000 UNITS tablet Take 1,000 Units by mouth daily.     Marland Kitchen escitalopram (LEXAPRO) 10 MG tablet Take 1.5 tablets (15 mg total) by mouth daily. Pt has supply 30 tablet 1  . lamoTRIgine (LAMICTAL) 100 MG tablet Take 1 tablet (100 mg total) by mouth daily. 30  tablet 1  . levonorgestrel (MIRENA) 20 MCG/24HR IUD 1 each by Intrauterine route once.    . Misc Natural Products (TURMERIC CURCUMIN) CAPS Take 500 capsules by mouth.    . Multiple Vitamin (MULTIVITAMIN) capsule Take 1 capsule by mouth daily.    . rizatriptan (MAXALT) 10 MG tablet     . valACYclovir (VALTREX) 1000 MG tablet Two at onset of blisters, then two more pills 12 hours later 4 tablet 3   No current facility-administered medications for this visit.     Neurologic: Headache: Yes Seizure: Negative Paresthesias:No  Musculoskeletal: Strength & Muscle Tone: within normal limits Gait & Station: normal Patient leans: N/A  Psychiatric Specialty Exam: Review of Systems  Psychiatric/Behavioral: Positive for depression. The patient is nervous/anxious.   All other systems  reviewed and are negative.   Blood pressure 111/77, pulse 78, temperature 99 F (37.2 C), temperature source Oral, height 5' 3.5" (1.613 m), weight 179 lb (81.2 kg), last menstrual period 06/15/2016.Body mass index is 31.21 kg/m.  General Appearance: Well Groomed  Eye Contact:  Good  Speech:  Clear and Coherent  Volume:  Normal  Mood:  Anxious  Affect:  Congruent  Thought Process:  Goal Directed  Orientation:  Full (Time, Place, and Person)  Thought Content:  WDL and Rumination  Suicidal Thoughts:  No  Homicidal Thoughts:  No  Memory:  Immediate;   Good Recent;   Good Remote;   Good  Judgement:  Intact  Insight:  Fair  Psychomotor Activity:  Normal  Concentration:  Good  Recall:  Good  Fund of Knowledge:Good  Language: Good  Akathisia:  Negative  Handed:  Right  AIMS (if indicated):  0  Assets:  Communication Skills Desire for Improvement Financial Resources/Insurance Housing Intimacy Resilience Social Support Talents/Skills Transportation Vocational/Educational  ADL's:  Intact  Cognition: WNL  Sleep:  Too much    Treatment Plan Summary: Lexapro 15 mg daily  Continue  lamotrigine 100  mg daily to help with her mood stabilization. I will add propanolol 10 mg by mouth twice a day to help with her migraine headaches and mood stabilization. She will be given 15 day supply at a time and 1 refill.  Patient agreed with the plan. Advised patient to continue with the therapy appointments.    Follow-up in one month or earlier depending on her symptoms    More than 50% of the time spent in psychoeducation, counseling and coordination of care.    This note was generated in part or whole with voice recognition software. Voice regonition is usually quite accurate but there are transcription errors that can and very often do occur. I apologize for any typographical errors that were not detected and corrected.    Rainey Pines, MD 9/13/20179:04 AM

## 2016-07-03 NOTE — Progress Notes (Signed)
   THERAPIST PROGRESS NOTE  Session Time: 28  Participation Level: Active  Behavioral Response: CasualAlertDepressed  Type of Therapy: Individual Therapy  Treatment Goals addressed: Coping  Interventions: Motivational Interviewing and Solution Focused  Summary: Erika Williamson is a 40 y.o. female who presents with continued symptoms of her diagnosis.  Patient reports that she is upset with her husband for leaving her at the [place].  She reports that after they located their seats he left her and viewed the race from a different location.  She denies communicating her feelings with him and states "it will come out later."  Discussion of her MH diagnosis and symptoms.  Discussion of how her bottling up her feelings only makes her symptoms worse.  Discussion of difficulty and frustration at work.  Discussion of a safe space while at work   Suicidal/Homicidal: No  Therapist Response: Therapist re-educated, explained and assisted the Patient on symptoms of her mental illness to assist her with recognizing and understanding her problematic behaviors.  Therapist requested Patient verbalize her struggles with inappropriate reactions toward others and assisted her with improving these interactions by engaging her in an activity working on continuing to develop social skills and learn age appropriate interpersonal relationship skills. Therapist reinforced Patient's behavior by praising her for doing a good job working on Education administrator and applying them to her day to day interactions.   Plan: Return again in 2 weeks.  Diagnosis: Axis I: Major Depression, Recurrent severe    Axis II: No diagnosis    Lubertha South, LCSW 06/25/2016

## 2016-07-08 ENCOUNTER — Ambulatory Visit (INDEPENDENT_AMBULATORY_CARE_PROVIDER_SITE_OTHER): Payer: BLUE CROSS/BLUE SHIELD | Admitting: Licensed Clinical Social Worker

## 2016-07-08 DIAGNOSIS — F332 Major depressive disorder, recurrent severe without psychotic features: Secondary | ICD-10-CM | POA: Diagnosis not present

## 2016-07-15 ENCOUNTER — Ambulatory Visit (INDEPENDENT_AMBULATORY_CARE_PROVIDER_SITE_OTHER): Payer: BLUE CROSS/BLUE SHIELD | Admitting: Obstetrics and Gynecology

## 2016-07-15 ENCOUNTER — Encounter: Payer: Self-pay | Admitting: Obstetrics and Gynecology

## 2016-07-15 VITALS — BP 109/70 | HR 73 | Ht 63.5 in | Wt 181.5 lb

## 2016-07-15 DIAGNOSIS — Z8669 Personal history of other diseases of the nervous system and sense organs: Secondary | ICD-10-CM

## 2016-07-15 DIAGNOSIS — N939 Abnormal uterine and vaginal bleeding, unspecified: Secondary | ICD-10-CM

## 2016-07-15 DIAGNOSIS — R45851 Suicidal ideations: Secondary | ICD-10-CM

## 2016-07-15 DIAGNOSIS — F3281 Premenstrual dysphoric disorder: Secondary | ICD-10-CM | POA: Diagnosis not present

## 2016-07-15 DIAGNOSIS — N946 Dysmenorrhea, unspecified: Secondary | ICD-10-CM

## 2016-07-15 NOTE — Progress Notes (Addendum)
    GYNECOLOGY PROGRESS NOTE  Subjective:    Patient ID: Erika Williamson, female    DOB: [birth year], 40 y.o.   MRN: [id number]  HPI  Patient is a 40 y.o. G59P2000 female who presents for discussion of PMDD symptoms.  Notes that even with Mirena IUD in place, still having heavy menses q 3 weeks (improved from q 2 weeks prior to IUD) with mild dysmenorrhea.  Notes that depressive symptoms have worsened, now noting suicidal and homicidal ideation in the week prior to onset of cycle. Was seen by Psychiatrist who had initiated her on antidepressants.  Notes that she does not feel as though these are helping.   The following portions of the patient's history were reviewed and updated as appropriate: allergies, current medications, past family history, past medical history, past social history, past surgical history and problem list.  Review of Systems Pertinent items noted in HPI and remainder of comprehensive ROS otherwise negative.   Objective:   Blood pressure 109/70, pulse 73, height 5' 3.5" (1.613 m), weight 181 lb 8 oz (82.3 kg), last menstrual period 06/15/2016. General appearance: alert and no distress.   Psych: depressed affect.  Exam deferred.    Assessment:   Abnormal uterine bleeding Dysmenorrhea H/o migraines PMDD with suicidal ideation  Plan:   - AUB (menorrhagia with irregular cycle), currently using IUD which has only provided minimal to moderate improvement (cycles previously q 2-3 weeks, now out to 3 weeks.  Still somewhat heavy.  Cannot use combined OCPs due to migraines, age, migraines, smoking history.  She declined use of Depo Provera in the past due to fear of weight gain. Has tried use of progesterone OCPs in the past which actually worsened bleeding.  Can consider a trial of Lupron.  Discussed possible removal of IUD and initiating Depo Provera, or suppression of cycles with Lupron.  Patient desires to try Lupron.   - Dysmenorrhea - has improved with IUD, but  still noting some mild symptoms. Is using OTC pain meds for relief.  - Discussed alternative options for PMDD.  Patient has tried several different hormonal options in the past (see above).  Currently with IUD in place which has not helped symptoms.   Possibly if patient gets a trial of Lupron this can help these symptoms as well.  Will attempt to order.   - Is currently doing intensive psych therapy 3x weekly.  Discussed possibility of needing further therapy (possibly inpatient) and adjustment of medications.  To notify Psychiatrist.    A total of 15 minutes were spent face-to-face with the patient during this encounter and over half of that time dealt with counseling and coordination of care.   Rubie Maid, MD [facility]

## 2016-07-15 NOTE — Patient Instructions (Signed)
mPremenstrual Syndrome Premenstrual syndrome (PMS) is a condition that consists of physical, emotional, and behavioral symptoms that affect women of childbearing age. PMS occurs 5-14 days before the start of a menstrual period and often recurs in a predictable pattern. The symptoms go away a few days after the menstrual period starts. PMS can interfere in many ways with normal daily activities and can range from mild to severe. When PMS is considered severe, it may be diagnosed as premenstrual dysphoric disorder (PMDD). A small percentage of women are affected by PMS symptoms and an even smaller percentage of those women are affected by PMDD.  CAUSES  The exact cause of PMS is unknown, but it seems to be related to cyclic hormone changes that happen before menstruation. These hormones are thought to affect chemicals in the brain (serotonin) that can influence a person's mood.  SYMPTOMS  Symptoms of PMS recur consistently from month to month and go away completely after the menstrual period starts. The most common emotional or behavioral symptom is mood swings. These mood swings can be disabling and interfere with normal activities of daily living. Other common symptoms include depression and angry outbursts. Other symptoms may include:   Irritability.  Anxiety.  Crying spells.   Food cravings or appetite changes.   Changes in sexual desire.   Confusion.   Aggression.   Social withdrawal.   Poor concentration. The most common physical symptoms include a sense of bloating, breast pain, headaches, and extreme fatigue. Other physical symptoms include:   Backaches.   Swelling of the hands and feet.   Weight gain.   Hot flashes.  DIAGNOSIS  To make a diagnosis, your caregiver will ask questions to confirm that you are having a pattern of symptoms. Symptoms must:   Be present 5 days before the start of your period and be present at least 3 months in a row.   End within 4  days after your period starts.   Interfere with some of your normal activities.  Other conditions, such as thyroid disease, depression, and migraine headaches must be ruled out before a diagnosis of PMS is confirmed.  TREATMENT  Your caregiver may suggest ways to maintain a healthy lifestyle, such as exercise. Over-the-counter pain relievers may ease cramps, aches, pains, headaches, and breast tenderness. However, selective serotonin reuptake inhibitors (SSRIs) are medicines that are most beneficial in improving PMS if taken in the second half of the monthly cycle. They may be taken on a daily basis. The most effective oral contraceptive pill used for symptoms of PMS is one that contains the ingredient drospirenone. Taking 4 days off of the pill instead of the usual 7 days also has shown to increase effectiveness.  There are a number of drugs, dietary supplements, vitamins, and water pills (diuretics) which have been suggested to be helpful but have not shown to be of any benefit to improving PMS symptoms.  HOME CARE INSTRUCTIONS   For 2-3 months, write down your symptoms, their severity, and how long they last. This may help your caregiver prescribe the best treatment for your symptoms.  Exercise regularly as suggested by your caregiver.  Eat a regular, well-balanced diet.  Avoid caffeine, alcohol, and tobacco consumption.  Limit salt and salty foods to lessen bloating and fluid retention.  Get enough sleep. Practice relaxation techniques.  Drink enough fluids to keep your urine clear or pale yellow.  Take medicines as directed by your caregiver.  Limit stress.  Take a multivitamin as directed by your  caregiver.   This information is not intended to replace advice given to you by your health care provider. Make sure you discuss any questions you have with your health care provider.   Document Released: 10/02/2000 Document Revised: 06/29/2012 Document Reviewed: 02/22/2012 Elsevier  Interactive Patient Education 2016 Elsevier Inc.  

## 2016-07-16 ENCOUNTER — Other Ambulatory Visit: Payer: Self-pay | Admitting: Neurology

## 2016-07-16 DIAGNOSIS — M542 Cervicalgia: Secondary | ICD-10-CM

## 2016-07-16 NOTE — Progress Notes (Signed)
   THERAPIST PROGRESS NOTE  Session Time: 27  Participation Level: Active  Behavioral Response: NeatAlertDepressed  Type of Therapy: Individual Therapy  Treatment Goals addressed: Coping  Interventions: CBT, Motivational Interviewing and Solution Focused  Summary: Erika Williamson is a 41 y.o. female who presents with symptoms of her depression.  Patient also reports an increase in her sleep pattern and an increase in worry. Patient shared information from her past with including details of her argument with her husband. Patient explained a cycle of family depression. Patient reports that she has not explained to her husband why she was upset with him.  She reports "it will come out."Patient agreed to meet with Therapist for the next session.  Patient will be assisted with learning to manage her symptoms associated with her diagnosis through therapeutic intervention.   Suicidal/Homicidal: No  Therapist Response: Provided support for Patient as she discussed her current mood. Stressed the importance of mood stabilization through learned coping skills and medication management.  Encouraged Patient to focus on her strengths and the positive aspects.  Plan: Return again in 2 weeks.  Diagnosis: Axis I: Major Depression, Recurrent severe    Axis II: No diagnosis    Lubertha South, LCSW 07/08/2016

## 2016-07-23 ENCOUNTER — Telehealth: Payer: Self-pay | Admitting: Obstetrics and Gynecology

## 2016-07-23 NOTE — Telephone Encounter (Signed)
Pharmacy called and needed to verify info on Lupron. Call [phone]   Reference # [id number]

## 2016-07-24 ENCOUNTER — Ambulatory Visit
Admission: RE | Admit: 2016-07-24 | Discharge: 2016-07-24 | Disposition: A | Discharge status: Home / Self Care | Payer: BLUE CROSS/BLUE SHIELD | Source: Ambulatory Visit | Attending: Neurology | Admitting: Neurology

## 2016-07-24 DIAGNOSIS — M542 Cervicalgia: Secondary | ICD-10-CM

## 2016-07-24 DIAGNOSIS — M50222 Other cervical disc displacement at C5-C6 level: Secondary | ICD-10-CM | POA: Diagnosis not present

## 2016-07-24 DIAGNOSIS — M4802 Spinal stenosis, cervical region: Secondary | ICD-10-CM | POA: Insufficient documentation

## 2016-07-24 NOTE — Telephone Encounter (Signed)
Pharmacy called to verify medication of Lupron 3.59m. IM monthly, 1 kit, 6 refills and was informed that prior authorization information was sent/faxed this am.

## 2016-07-28 ENCOUNTER — Ambulatory Visit (INDEPENDENT_AMBULATORY_CARE_PROVIDER_SITE_OTHER): Payer: BLUE CROSS/BLUE SHIELD | Admitting: Licensed Clinical Social Worker

## 2016-07-28 DIAGNOSIS — F332 Major depressive disorder, recurrent severe without psychotic features: Secondary | ICD-10-CM | POA: Diagnosis not present

## 2016-07-31 ENCOUNTER — Encounter: Payer: Self-pay | Admitting: Psychiatry

## 2016-07-31 ENCOUNTER — Ambulatory Visit (INDEPENDENT_AMBULATORY_CARE_PROVIDER_SITE_OTHER): Payer: BLUE CROSS/BLUE SHIELD | Admitting: Psychiatry

## 2016-07-31 VITALS — BP 106/72 | HR 76 | Wt 181.0 lb

## 2016-07-31 DIAGNOSIS — F609 Personality disorder, unspecified: Secondary | ICD-10-CM

## 2016-07-31 DIAGNOSIS — F3160 Bipolar disorder, current episode mixed, unspecified: Secondary | ICD-10-CM

## 2016-07-31 MED ORDER — ESCITALOPRAM OXALATE 10 MG PO TABS: 15.0000 mg | ORAL_TABLET | Freq: Every day | ORAL | 1 refills | Status: DC

## 2016-07-31 MED ORDER — LAMOTRIGINE 100 MG PO TABS: 100.0000 mg | ORAL_TABLET | Freq: Every day | ORAL | 1 refills | Status: DC

## 2016-07-31 MED ORDER — PROPRANOLOL HCL 10 MG PO TABS: 10.0000 mg | ORAL_TABLET | Freq: Two times a day (BID) | ORAL | 1 refills | Status: DC

## 2016-07-31 MED ORDER — ALPRAZOLAM 0.25 MG PO TABS: 0.2500 mg | ORAL_TABLET | Freq: Every evening | ORAL | 0 refills | Status: DC | PRN

## 2016-07-31 NOTE — Progress Notes (Signed)
Psychiatric MD Progress Note   Patient Identification: Erika Williamson MRN:  [id number] Date of Evaluation:  07/31/2016 Referral Source: IOP Chief Complaint:   Chief Complaint    Follow-up; Medication Refill     Visit Diagnosis:    ICD-9-CM ICD-10-CM   1. Bipolar I disorder, most recent episode mixed (Erika Williamson) 296.60 F31.60   2. Personality disorder 301.9 F60.9     History of Present Illness:  Ms Erika Williamson Is a 40 year old female who presented for the follow-up appointment. She reported that she recently followed up with Dr Manuella Ghazi Neurologist and he has done some evaluation. He was recently started on baclofen for her neck problems. She reported that the baclofen is making her very tired and sedated. She is only taking half a pill. However she feels that her muscles are getting relaxed. She is improving on the medications. She reported that her endocrinologist is also evaluating for her agitation close to  Her menstrual cycle. Patient reported that they're going to give her some medications about the same. Patient reported that she has been compliant with her medications and she has been improving. She appeared calm and alert during the interview.   Patient is planning to go to the mountains with her friend over this weekend. She appeared alert during the interview. She denied having any agitation and anger anxiety or paranoia.. Patient currently denied having any suicidal homicidal ideations or plans.  She sleeps well at night. She denied having any perceptual disturbances.       Associated Signs/Symptoms: Depression Symptoms:  depressed mood, psychomotor agitation, fatigue, difficulty concentrating, anxiety, (Hypo) Manic Symptoms:  Distractibility, Impulsivity, Irritable Mood, Labiality of Mood, Anxiety Symptoms:  Excessive Worry, Psychotic Symptoms:  none PTSD Symptoms: Negative  Past Psychiatric History: inpatient after overdose aged 23, otherwise no issues till current  episode when she began therapy.  Has dealt with depression by covering it with over involvement over the years.  Previous Psychotropic Medications: Yes   Substance Abuse History in the last 12 months:  No.  Consequences of Substance Abuse: Negative  Past Medical History:  Past Medical History:  Diagnosis Date  . 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 Anxiety   . Atypical face pain    Left Sided  . COPD (chronic obstructive pulmonary disease) ([place])   . Depression   . Foot fracture Left X2  . GERD (gastroesophageal reflux disease)   . HPV (human papilloma virus) infection   . Migraine without aura 04/03/2016  . Moderate recurrent major depression ([place]) 07/22/2015  . Shingles   . Sinusitis   . Tension headache   . Tobacco abuse   . Tonsillitis   . Vitamin D deficiency disease     Past Surgical History:  Procedure Laterality Date  . CESAREAN SECTION     X2  . CHOLECYSTECTOMY     Dr Burt Knack  . COLONOSCOPY WITH PROPOFOL N/A 05/03/2015   Procedure: COLONOSCOPY WITH PROPOFOL;  Surgeon: Josefine Class, MD;  Location: [facility] ENDOSCOPY;  Service: Endoscopy;  Laterality: N/A;  . ESOPHAGOGASTRODUODENOSCOPY N/A 05/03/2015   Procedure: ESOPHAGOGASTRODUODENOSCOPY (EGD);  Surgeon: Josefine Class, MD;  Location: [facility] ENDOSCOPY;  Service: Endoscopy;  Laterality: N/A;  . 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 TONSILLECTOMY    . TUBAL LIGATION    . WISDOM TOOTH EXTRACTION      Family Psychiatric History: father alcoholic, sister depressed  Family History:  Family History  Problem Relation Age of Onset  . Diabetes Mother   . Hypertension Mother   . COPD Mother   . Stroke Mother   . Osteoporosis Mother   . Depression Mother   . Cancer Father 47    colorectal, spread to lung  . Alcohol abuse Father   . Hypertension Father   . Depression Father   . Diabetes Sister   . COPD Sister   . Anxiety disorder Sister   . Depression  Sister   . Fibromyalgia Sister   . Asthma Sister   . ADD / ADHD Son   . ADD / ADHD Daughter     Social History:   Social History   Social History  . Marital status: Married    Spouse name: N/A  . Number of children: N/A  . Years of education: N/A   Social History Main Topics  . Smoking status: Current Every Day Smoker    Packs/day: 1.00    Years: 22.00    Types: Cigarettes    Start date: 09/03/1993  . Smokeless tobacco: Never Used  . Alcohol use No  . Drug use: No  . Sexual activity: Yes    Birth control/ protection: IUD   Other Topics Concern  . None   Social History Narrative  . None    Additional Social History: mother was terminal for many years when she was growing up and father was alcoholic till he died  Allergies:   Allergies  Allergen Reactions  . Gabapentin Anaphylaxis  . Ivp Dye [Iodinated Diagnostic Agents]   . Acyclovir And Related   . Compazine [Prochlorperazine Edisylate]   . Erythromycin   . Levaquin [Levofloxacin In D5w]   . Levofloxacin Other (See Comments)  . Naprosyn [Naproxen]   . Nsaids   . Pristiq [Desvenlafaxine]   . Tramadol Other (See Comments)  . Ultram [Tramadol Hcl]     Metabolic Disorder Labs: No results found for: HGBA1C, MPG No results found for: PROLACTIN No results found for: CHOL, TRIG, HDL, CHOLHDL, VLDL, LDLCALC   Current Medications: Current Outpatient Prescriptions  Medication Sig Dispense Refill  . ALPRAZolam (XANAX) 0.25 MG tablet Take 1 tablet (0.25 mg total) by mouth at bedtime as needed for anxiety. 30 tablet 0  . baclofen (LIORESAL) 10 MG tablet Take by mouth.    . Cholecalciferol (VITAMIN D-1000 MAX ST) 1000 UNITS tablet Take 1,000 Units by mouth daily.     Marland Kitchen escitalopram (LEXAPRO) 10 MG tablet Take 1.5 tablets (15 mg total) by mouth daily. Pt has supply 30 tablet 1  . lamoTRIgine (LAMICTAL) 100 MG tablet Take 1 tablet (100 mg total) by mouth daily. 30 tablet 1  . levonorgestrel (MIRENA) 20 MCG/24HR IUD  1 each by Intrauterine route once.    . Misc Natural Products (TURMERIC CURCUMIN) CAPS Take 500 capsules by mouth.    . Multiple Vitamin (MULTIVITAMIN) capsule Take 1 capsule by mouth daily.    . propranolol (INDERAL) 10 MG tablet Take 1 tablet (10 mg total) by mouth 2 (two) times daily. 30 tablet 1  . rizatriptan (MAXALT) 10 MG tablet     . valACYclovir (VALTREX) 1000 MG tablet Two at onset of blisters, then two more pills 12 hours later 4 tablet 3   No current facility-administered medications for this visit.     Neurologic: Headache: Yes Seizure: Negative Paresthesias:No  Musculoskeletal: Strength & Muscle Tone: within normal limits Gait & Station: normal Patient leans: N/A  Psychiatric Specialty Exam: Review of Systems  Psychiatric/Behavioral: Positive for depression. The patient is nervous/anxious.   All other systems reviewed and are negative.   Blood pressure 106/72, pulse 76, weight 181 lb (82.1 kg).Body mass index is 31.56 kg/m.  General Appearance: Well Groomed  Eye Contact:  Good  Speech:  Clear and Coherent  Volume:  Normal  Mood:  Anxious  Affect:  Congruent  Thought Process:  Goal Directed  Orientation:  Full (Time, Place, and Person)  Thought Content:  WDL and Rumination  Suicidal Thoughts:  No  Homicidal Thoughts:  No  Memory:  Immediate;   Good Recent;   Good Remote;   Good  Judgement:  Intact  Insight:  Fair  Psychomotor Activity:  Normal  Concentration:  Good  Recall:  Good  Fund of Knowledge:Good  Language: Good  Akathisia:  Negative  Handed:  Right  AIMS (if indicated):  0  Assets:  Communication Skills Desire for Improvement Financial Resources/Insurance Housing Intimacy Resilience Social Support Talents/Skills Transportation Vocational/Educational  ADL's:  Intact  Cognition: WNL  Sleep:  Too much    Treatment Plan Summary: Lexapro 15 mg daily  Continue  lamotrigine 100 mg daily to help with her mood stabilization. I will add  propanolol 10 mg by mouth twice a day to help with her migraine headaches and mood stabilization. She will be given 15 day supply at a time and 1 refill.  Patient agreed with the plan. Advised patient to continue with the therapy appointments.   Follow-up in 2 months  or earlier depending on her symptoms    More than 50% of the time spent in psychoeducation, counseling and coordination of care.    This note was generated in part or whole with voice recognition software. Voice regonition is usually quite accurate but there are transcription errors that can and very often do occur. I apologize for any typographical errors that were not detected and corrected.    Rainey Pines, MD 10/13/201710:21 AM

## 2016-07-31 NOTE — Progress Notes (Signed)
   THERAPIST PROGRESS NOTE  Session Time: 49  Participation Level: Active  Behavioral Response: CasualAlertEuthymic  Type of Therapy: Individual Therapy  Treatment Goals addressed: Coping  Interventions: Motivational Interviewing and Solution Focused  Summary: Erika Williamson is a 40 y.o. female who presents with continued symptoms of her diagnosis.  Patient reports that work has been frustration.  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  She reports that she has asked for assistance on several occassions but has not be able to obtain help with her department.  Patient states that she feels relieved that she and her husband are talking about the current concerns in their relationship.  She reports that they both felt the same way.  SHe reports adding him to her support system and not being fearful of letting him become aware of her thoughts and mood.  She reports being able to use self care at least weekly.    Suicidal/Homicidal: No  Therapist Response: Assessed ptt current functioning per pt report.  Processed w/pt how resolved conflict w/ friend and impact having on mood.  Explored w/pt other barriers to wellness and encouraged pt to make changes for self  Plan: Return again in 2 weeks.  Diagnosis: Axis I: Depression    Axis II: No diagnosis    Lubertha South, LCSW 07/31/2016

## 2016-08-18 ENCOUNTER — Ambulatory Visit (INDEPENDENT_AMBULATORY_CARE_PROVIDER_SITE_OTHER): Payer: BLUE CROSS/BLUE SHIELD | Admitting: Licensed Clinical Social Worker

## 2016-08-18 DIAGNOSIS — F3160 Bipolar disorder, current episode mixed, unspecified: Secondary | ICD-10-CM

## 2016-08-20 NOTE — Progress Notes (Signed)
   THERAPIST PROGRESS NOTE  Session Time:60min  Participation Level: Active  Behavioral Response: CasualAlertEuphoric  Type of Therapy: Individual Therapy  Treatment Goals addressed: Anger  Interventions: CBT, Motivational Interviewing, Solution Focused, Strength-based, Family Systems and Reframing  Summary: Erika Williamson is a 40 y.o. female who presents with continued symptoms of her diagnosis.  Patient reports her mood as angry and irritable.  Patient reports that she wakes up angry but unsure why.  Patient unable to list her triggers. Patient states "I hate going to work.  I am going to get the Doctor to complete an Intermittent LOA."  Patient reports that she is unable release the fog from her head.  Patient unable to cope with her mood.  Patient was able to complete task to assist with understanding her diagnosis and coping with it.  Patient states that at the end of the session she is currently in a better mood.   Suicidal/Homicidal: No  Therapist Response: LCSW provided Patient with ongoing emotional support and encouragement.  Normalized her feelings.  Commended Patient on her progress and reinforced the importance of client staying focused on her own strengths and resources and resiliency. Processed various strategies for dealing with stressors.    Plan: Return again in 2 weeks.  Diagnosis: Axis I: Bipolar, Depressed    Axis II: No diagnosis    Lubertha South, LCSW 08/20/2016

## 2016-08-24 ENCOUNTER — Ambulatory Visit (INDEPENDENT_AMBULATORY_CARE_PROVIDER_SITE_OTHER): Payer: BLUE CROSS/BLUE SHIELD | Admitting: Psychiatry

## 2016-08-24 ENCOUNTER — Encounter: Payer: Self-pay | Admitting: Psychiatry

## 2016-08-24 ENCOUNTER — Telehealth: Payer: Self-pay

## 2016-08-24 VITALS — BP 104/78 | HR 79 | Ht 63.5 in | Wt 181.8 lb

## 2016-08-24 DIAGNOSIS — F609 Personality disorder, unspecified: Secondary | ICD-10-CM | POA: Diagnosis not present

## 2016-08-24 DIAGNOSIS — F3160 Bipolar disorder, current episode mixed, unspecified: Secondary | ICD-10-CM | POA: Diagnosis not present
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
## 2016-08-24 NOTE — Telephone Encounter (Signed)
Medication management - Telephone call with Dellia Nims to refer patient back to IOP at the [facility] office per Dr. Gretel Acre.  IOP full at present but collateral arranged for pt. to tentatively start 09/02/16.  Will call if can get in earlier and patient agreed with plan.  Patient to call back if any increased problems prior to returning to IOP for increased irritability and labile mood.

## 2016-08-26 ENCOUNTER — Encounter (HOSPITAL_COMMUNITY): Payer: Self-pay | Admitting: Psychiatry

## 2016-08-26 ENCOUNTER — Other Ambulatory Visit (HOSPITAL_COMMUNITY): Payer: BLUE CROSS/BLUE SHIELD | Attending: Psychiatry | Admitting: Psychiatry

## 2016-08-26 DIAGNOSIS — Z833 Family history of diabetes mellitus: Secondary | ICD-10-CM | POA: Insufficient documentation

## 2016-08-26 DIAGNOSIS — F17210 Nicotine dependence, cigarettes, uncomplicated: Secondary | ICD-10-CM | POA: Insufficient documentation

## 2016-08-26 DIAGNOSIS — Z79899 Other long term (current) drug therapy: Secondary | ICD-10-CM | POA: Insufficient documentation

## 2016-08-26 DIAGNOSIS — Z823 Family history of stroke: Secondary | ICD-10-CM | POA: Insufficient documentation

## 2016-08-26 DIAGNOSIS — Z9049 Acquired absence of other specified parts of digestive tract: Secondary | ICD-10-CM | POA: Diagnosis not present

## 2016-08-26 DIAGNOSIS — Z888 Allergy status to other drugs, medicaments and biological substances status: Secondary | ICD-10-CM | POA: Insufficient documentation

## 2016-08-26 DIAGNOSIS — Z9889 Other specified postprocedural states: Secondary | ICD-10-CM | POA: Diagnosis not present

## 2016-08-26 DIAGNOSIS — Z825 Family history of asthma and other chronic lower respiratory diseases: Secondary | ICD-10-CM | POA: Diagnosis not present

## 2016-08-26 DIAGNOSIS — Z8249 Family history of ischemic heart disease and other diseases of the circulatory system: Secondary | ICD-10-CM | POA: Diagnosis not present

## 2016-08-26 DIAGNOSIS — F339 Major depressive disorder, recurrent, unspecified: Secondary | ICD-10-CM | POA: Diagnosis not present

## 2016-08-26 DIAGNOSIS — Z811 Family history of alcohol abuse and dependence: Secondary | ICD-10-CM | POA: Diagnosis not present

## 2016-08-26 DIAGNOSIS — R454 Irritability and anger: Secondary | ICD-10-CM | POA: Diagnosis not present

## 2016-08-26 DIAGNOSIS — F3160 Bipolar disorder, current episode mixed, unspecified: Secondary | ICD-10-CM

## 2016-08-26 MED ORDER — ARIPIPRAZOLE 2 MG PO TABS: 2.0000 mg | ORAL_TABLET | Freq: Every day | ORAL | 0 refills | Status: DC
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
## 2016-08-26 NOTE — Progress Notes (Signed)
Psychiatric Initial Adult Assessment   Patient Identification: Erika Williamson MRN:  [id number] Date of Evaluation:  08/26/2016 Referral Source: Dr Gretel Acre Chief Complaint:   Visit Diagnosis: severe major depression, recurrent without psychotic features  History of Present Illness:  Erika Williamson was in this IOP in June of this year for similar issues.  Since she left she has not been able to see her therapist much and believes her medications are not helping.  No major changes other than continuing feeling of stressed out and anger at everybody for little or nothing.  Wants to hurt people but is able to restrain herself.  Gets up every day with anger.  This anger seems more a part of depression than a bipolar disorder in that she is sad, has no energy, has no reason to live, wishes something like a deer would run out in front of her car saying she would speed up hoping to kill herself.  Would not actively plan to kill her self she says.  Feels hopeless, finds no joy or pleasure in things she used to enjoy.  Says her best friend's son was killed in a car accident in June and after that is when things got worse.  Still struggling with job pressures and relationship with her husband.  Associated Signs/Symptoms: Depression Symptoms:  depressed mood, anhedonia, insomnia, fatigue, feelings of worthlessness/guilt, difficulty concentrating, hopelessness, impaired memory, anxiety, (Hypo) Manic Symptoms:  Irritable Mood, Anxiety Symptoms:  Excessive Worry, Psychotic Symptoms:  none PTSD Symptoms: Negative  Past Psychiatric History: ongoing outpatient treatment  Previous Psychotropic Medications: Yes   Substance Abuse History in the last 12 months:  No.  Consequences of Substance Abuse: Negative  Past Medical History:  Past Medical History:  Diagnosis Date  . 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 Anxiety   . Atypical face pain     Left Sided  . COPD (chronic obstructive pulmonary disease) ([place])   . Depression   . Foot fracture Left X2  . GERD (gastroesophageal reflux disease)   . HPV (human papilloma virus) infection   . Migraine without aura 04/03/2016  . Moderate recurrent major depression ([place]) 07/22/2015  . Shingles   . Sinusitis   . Tension headache   . Tobacco abuse   . Tonsillitis   . Vitamin D deficiency disease     Past Surgical History:  Procedure Laterality Date  . CESAREAN SECTION     X2  . CHOLECYSTECTOMY     Dr Burt Knack  . COLONOSCOPY WITH PROPOFOL N/A 05/03/2015   Procedure: COLONOSCOPY WITH PROPOFOL;  Surgeon: Josefine Class, MD;  Location[facility] ENDOSCOPY;  Service: Endoscopy;  Laterality: N/A;  . ESOPHAGOGASTRODUODENOSCOPY N/A 05/03/2015   Procedure: ESOPHAGOGASTRODUODENOSCOPY (EGD);  Surgeon: Josefine Class, MD;  Location: [facility] ENDOSCOPY;  Service: Endoscopy;  Laterality: N/A;  . 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 TONSILLECTOMY    . TUBAL LIGATION    . WISDOM TOOTH EXTRACTION      Family Psychiatric History: none of current relevance  Family History:  Family History  Problem Relation Age of Onset  . Diabetes Mother   . Hypertension Mother   . COPD Mother   . Stroke Mother   . Osteoporosis Mother   . Depression Mother   . Cancer Father 80    colorectal, spread to lung  . Alcohol abuse Father   . Hypertension Father   . Depression Father   .  Diabetes Sister   . COPD Sister   . Anxiety disorder Sister   . Depression Sister   . Fibromyalgia Sister   . Asthma Sister   . ADD / ADHD Son   . ADD / ADHD Daughter     Social History:   Social History   Social History  . Marital status: Married    Spouse name: N/A  . Number of children: N/A  . Years of education: N/A   Social History Main Topics  . Smoking status: Current Every Day Smoker    Packs/day: 1.00    Years: 22.00    Types: Cigarettes    Start date: 09/03/1993  . Smokeless tobacco: Never Used  .  Alcohol use No  . Drug use: No  . Sexual activity: Not Currently    Birth control/ protection: IUD   Other Topics Concern  . Not on file   Social History Narrative  . No narrative on file    Additional Social History: none  Allergies:   Allergies  Allergen Reactions  . Gabapentin Anaphylaxis  . Ivp Dye [Iodinated Diagnostic Agents]   . Acyclovir And Related   . Compazine [Prochlorperazine Edisylate]   . Erythromycin   . Levaquin [Levofloxacin In D5w]   . Levofloxacin Other (See Comments)  . Naprosyn [Naproxen]   . Nsaids   . Pristiq [Desvenlafaxine]   . Tramadol Other (See Comments)  . Ultram [Tramadol Hcl]     Metabolic Disorder Labs: No results found for: HGBA1C, MPG No results found for: PROLACTIN No results found for: CHOL, TRIG, HDL, CHOLHDL, VLDL, LDLCALC   Current Medications: Current Outpatient Prescriptions  Medication Sig Dispense Refill  . ALPRAZolam (XANAX) 0.25 MG tablet Take 1 tablet (0.25 mg total) by mouth at bedtime as needed for anxiety. 30 tablet 0  . Cholecalciferol (VITAMIN D-1000 MAX ST) 1000 UNITS tablet Take 1,000 Units by mouth daily.     Marland Kitchen escitalopram (LEXAPRO) 10 MG tablet Take 1.5 tablets (15 mg total) by mouth daily. 30 tablet 1  . lamoTRIgine (LAMICTAL) 100 MG tablet Take 1 tablet (100 mg total) by mouth daily. 30 tablet 1  . levonorgestrel (MIRENA) 20 MCG/24HR IUD 1 each by Intrauterine route once.    . Misc Natural Products (TURMERIC CURCUMIN) CAPS Take 500 capsules by mouth.    . Multiple Vitamin (MULTIVITAMIN) capsule Take 1 capsule by mouth daily.    . propranolol (INDERAL) 10 MG tablet Take 1 tablet (10 mg total) by mouth 2 (two) times daily. 30 tablet 1  . rizatriptan (MAXALT) 10 MG tablet     . valACYclovir (VALTREX) 1000 MG tablet Two at onset of blisters, then two more pills 12 hours later 4 tablet 3   No current facility-administered medications for this visit.     Neurologic: Headache: Negative Seizure:  Negative Paresthesias:Negative  Musculoskeletal: Strength & Muscle Tone: within normal limits Gait & Station: normal Patient leans: N/A  Psychiatric Specialty Exam: ROS  There were no vitals taken for this visit.There is no height or weight on file to calculate BMI.  General Appearance: Well Groomed  Eye Contact:  Good  Speech:  Clear and Coherent  Volume:  Normal  Mood:  Depressed and Irritable  Affect:  Congruent  Thought Process:  Coherent and Goal Directed  Orientation:  Full (Time, Place, and Person)  Thought Content:  Logical  Suicidal Thoughts:  No  Homicidal Thoughts:  No  Memory:  Immediate;   Good Recent;   Good Remote;  Good  Judgement:  Intact  Insight:  Fair  Psychomotor Activity:  Normal  Concentration:  Concentration: Good and Attention Span: Good  Recall:  Good  Fund of Knowledge:Good  Language: Good  Akathisia:  Negative  Handed:  Right  AIMS (if indicated):  0  Assets:  Communication Skills Desire for Improvement Financial Resources/Insurance Housing Social Support Talents/Skills Transportation Vocational/Educational  ADL's:  Intact  Cognition: WNL  Sleep:  poor    Treatment Plan Summary: Admit to IOP with daily group therapy.  Try aripiprazole 2 mg daily to help control anger and impulsivity and to perhaps augment escitalopram   Donnelly Angelica, MD 11/8/20171:42 PM

## 2016-08-27 ENCOUNTER — Other Ambulatory Visit (HOSPITAL_COMMUNITY): Payer: BLUE CROSS/BLUE SHIELD | Admitting: Psychiatry

## 2016-08-27 DIAGNOSIS — F3160 Bipolar disorder, current episode mixed, unspecified: Secondary | ICD-10-CM

## 2016-08-27 DIAGNOSIS — F339 Major depressive disorder, recurrent, unspecified: Secondary | ICD-10-CM | POA: Diagnosis not present

## 2016-08-28 ENCOUNTER — Other Ambulatory Visit (HOSPITAL_COMMUNITY): Payer: BLUE CROSS/BLUE SHIELD | Admitting: Psychiatry

## 2016-08-28 DIAGNOSIS — F339 Major depressive disorder, recurrent, unspecified: Secondary | ICD-10-CM | POA: Diagnosis not present

## 2016-08-28 DIAGNOSIS — F3160 Bipolar disorder, current episode mixed, unspecified: Secondary | ICD-10-CM

## 2016-08-31 ENCOUNTER — Other Ambulatory Visit (HOSPITAL_COMMUNITY): Payer: BLUE CROSS/BLUE SHIELD | Admitting: Psychiatry

## 2016-08-31 DIAGNOSIS — F339 Major depressive disorder, recurrent, unspecified: Secondary | ICD-10-CM | POA: Diagnosis not present

## 2016-08-31 DIAGNOSIS — F3160 Bipolar disorder, current episode mixed, unspecified: Secondary | ICD-10-CM

## 2016-08-31 NOTE — Progress Notes (Signed)
    Daily Group Progress Note  Program: IOP  Group Time: 9:00-12:00   Participation Level:  active   Behavioral Response:  responsive  Type of Therapy:  group therapy   Summary of Progress:  Pt saw the psychiatrist today, so was out of group for part of the session. However, when present she offered words of encouragement and insight to other group members related to her work and previous experience in group.  Nancie Neas, LPC

## 2016-08-31 NOTE — Progress Notes (Signed)
    Daily Group Progress Note  Program: IOP  Program: IOP   Group Time: 9:00-12:00 (9-10 [facility] provided educational information)   Participation Level:  active   Behavioral Response:  responsive but drowsy during group  Summary of Progress:  Patient reports feeling sleep today. Today patient appeared drowsy and slept throughout the class. Her participation level was low.  She did not share or process during group today. When group ended today patient apologized that she fell asleep in group. Shared that she has on-going medical issues and is in a lot of pain. 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   Nancie Neas, LPC

## 2016-09-01 ENCOUNTER — Other Ambulatory Visit (HOSPITAL_COMMUNITY): Payer: BLUE CROSS/BLUE SHIELD | Admitting: Psychiatry

## 2016-09-01 DIAGNOSIS — F3160 Bipolar disorder, current episode mixed, unspecified: Secondary | ICD-10-CM

## 2016-09-01 DIAGNOSIS — F339 Major depressive disorder, recurrent, unspecified: Secondary | ICD-10-CM | POA: Diagnosis not present

## 2016-09-01 NOTE — Progress Notes (Signed)
    Daily Group Progress Note  Program: IOP  Group Time: 9:00-12:00   Participation Level:  active   Behavioral Response: responsive   Type of Therapy: group therapy   Summary of Progress:  Pt shared about the relationship her chronic occipital nerve pain has to the expression of her depression and anxiety. She also shared in depth about the numerous close familial deaths she has experienced in the last decade, and some recent deaths which have retraumatized her. Pt referred to her tendency to misdirect anger at others as "ugly" but also expressed that she does not care in the moment when she is being cruel what her actions are. Counselor pointed out the contradiction, and pt was unable to reconcile the connection between these two feelings, however she expressed that she will continue to think about it. Pt. Participated in grief and loss facilitated by the Chaplain.  Nancie Neas, LPC

## 2016-09-02 ENCOUNTER — Ambulatory Visit: Payer: BLUE CROSS/BLUE SHIELD | Admitting: Licensed Clinical Social Worker

## 2016-09-02 ENCOUNTER — Other Ambulatory Visit (HOSPITAL_COMMUNITY): Payer: BLUE CROSS/BLUE SHIELD | Admitting: Psychiatry

## 2016-09-02 DIAGNOSIS — F3160 Bipolar disorder, current episode mixed, unspecified: Secondary | ICD-10-CM

## 2016-09-02 DIAGNOSIS — F339 Major depressive disorder, recurrent, unspecified: Secondary | ICD-10-CM | POA: Diagnosis not present

## 2016-09-03 ENCOUNTER — Other Ambulatory Visit (HOSPITAL_COMMUNITY): Payer: BLUE CROSS/BLUE SHIELD | Admitting: Psychiatry

## 2016-09-03 DIAGNOSIS — F339 Major depressive disorder, recurrent, unspecified: Secondary | ICD-10-CM | POA: Diagnosis not present

## 2016-09-03 DIAGNOSIS — F3160 Bipolar disorder, current episode mixed, unspecified: Secondary | ICD-10-CM

## 2016-09-03 NOTE — Progress Notes (Signed)
    Daily Group Progress Note  Program: IOP  Group Time: 9:00-12:00   Participation Level:minimal   Behavioral Response:responsive   Type of Therapy:  group therapy   Summary of Progress:  Pt reported feeling very down. She didn't do much over the weekend other than sleep. However, this morning her niece, who is struggling with crippling anxiety, sent her a message, and this upset her. She worries that her niece is unwell and needs help, which the pt is unable to give as her niece lives in [place]. Pt also feels resentful of her niece for not often contacting her. She fears she did not do enough to protect her niece when she was young because pt's sister was an abusive mother. Pt continues to express enormous amounts of anger, and an inability to cope with this anger. Counselor encouraged the pt to look into anger management classes with the [employer].  Nancie Neas, LPC

## 2016-09-03 NOTE — Progress Notes (Signed)
    Daily Group Progress Note  Program: IOP  Group Time: 9-12:00  Participation Level: Active  Behavioral Response: Appropriate  Type of Therapy:  Group Therapy  Summary of Progress: Pt. Presented as talkative, actively engaged in the group process. Pt. Reported that her depression was better and that she was managing her anger better by being assertive with difficult family members.     Nancie Neas, LPC

## 2016-09-03 NOTE — Progress Notes (Signed)
    Daily Group Progress Note  Program: IOP  Group Time: 9:00-12:00  Participation Level: Active  Behavioral Response: Appropriate  Type of Therapy:  Group Therapy  Summary of Progress: Pt. Presented as alert, talkative, engaged in the group process. Pt. Reported that she was "doing better", feels more in control of her emotions and is managing work and family stress better. Pt. Participated in discussion about body image, emotional numbing,and finding creative outlets for emotional expression.       Nancie Neas, LPC

## 2016-09-03 NOTE — Progress Notes (Signed)
    Daily Group Progress Note  Program: IOP  Group Time: 9:00-12:00   Participation Level:  active   Behavioral Response:  engaged   Type of Therapy:  group therapy   Summary of Progress: Pt. Presented as talkative and engaged in the group process. Pt. Discussed ongoing problems with her sister and difficulty of setting healthy boundaries with her.  Patient was alert and active during group.  The group discussed mindfulness and simple ways it can be practiced, including using mazes, coloring, or completing word searches.  The Patient discussed using word searches, but not coloring due to it stressing her out.  Group discussed the importance of using senses, observing, describing, focusing on one thing at a time, being fully involved, and being non-judgmental.    Nancie Neas, LPC

## 2016-09-04 ENCOUNTER — Other Ambulatory Visit (HOSPITAL_COMMUNITY): Payer: BLUE CROSS/BLUE SHIELD

## 2016-09-07 ENCOUNTER — Other Ambulatory Visit (HOSPITAL_COMMUNITY): Payer: BLUE CROSS/BLUE SHIELD | Admitting: Psychiatry

## 2016-09-07 DIAGNOSIS — F339 Major depressive disorder, recurrent, unspecified: Secondary | ICD-10-CM | POA: Diagnosis not present

## 2016-09-07 DIAGNOSIS — F3160 Bipolar disorder, current episode mixed, unspecified: Secondary | ICD-10-CM

## 2016-09-08 ENCOUNTER — Other Ambulatory Visit (HOSPITAL_COMMUNITY): Payer: BLUE CROSS/BLUE SHIELD

## 2016-09-09 ENCOUNTER — Other Ambulatory Visit (HOSPITAL_COMMUNITY): Payer: BLUE CROSS/BLUE SHIELD | Admitting: Psychiatry

## 2016-09-09 DIAGNOSIS — F339 Major depressive disorder, recurrent, unspecified: Secondary | ICD-10-CM | POA: Diagnosis not present

## 2016-09-09 DIAGNOSIS — F3160 Bipolar disorder, current episode mixed, unspecified: Secondary | ICD-10-CM

## 2016-09-09 NOTE — Progress Notes (Signed)
    Daily Group Progress Note  Program: IOP  Group Time: 9:00-12:00   Participation Level:  active   Behavioral Response:  engaged   Type of Therapy: group therapy   Summary of Progress:  Pt reports feeling a lot of anger for some family members who gave away items in her deceased fathers house without her permission. She also reports feeling very stressed and frustrated by people at work. Other pt's and counselors suggested anger management techniques to pt, such as sitting with her frustration, feeling the emotions under the anger, and utilizing assertive communication in the moment so she does not stew on her frustrations silently.   Nancie Neas, LPC

## 2016-09-11 ENCOUNTER — Other Ambulatory Visit (HOSPITAL_COMMUNITY): Payer: BLUE CROSS/BLUE SHIELD

## 2016-09-14 ENCOUNTER — Other Ambulatory Visit (HOSPITAL_COMMUNITY): Payer: BLUE CROSS/BLUE SHIELD | Admitting: Psychiatry

## 2016-09-14 DIAGNOSIS — F3160 Bipolar disorder, current episode mixed, unspecified: Secondary | ICD-10-CM

## 2016-09-14 DIAGNOSIS — F339 Major depressive disorder, recurrent, unspecified: Secondary | ICD-10-CM | POA: Diagnosis not present

## 2016-09-14 NOTE — Progress Notes (Signed)
    Daily Group Progress Note  Program: IOP  Group Time: 9:00-12:00   Participation Level:  active   Behavioral Response:  engaged   Type of Therapy:  group therapy   Summary of Progress:  Pt related to the theme of death an loss. She recounted her experience of caregiving for both her parents, and the toll it took on her emotionally. Pt also recounted some difficulties she's been having emotionally with her husband. Counselor used Brewing technologist and psycho-ed around assertive communication styles.   Nancie Neas, LPC

## 2016-09-15 ENCOUNTER — Other Ambulatory Visit (HOSPITAL_COMMUNITY): Payer: BLUE CROSS/BLUE SHIELD | Admitting: Psychiatry

## 2016-09-15 DIAGNOSIS — F331 Major depressive disorder, recurrent, moderate: Secondary | ICD-10-CM

## 2016-09-15 DIAGNOSIS — F3160 Bipolar disorder, current episode mixed, unspecified: Secondary | ICD-10-CM

## 2016-09-15 DIAGNOSIS — F339 Major depressive disorder, recurrent, unspecified: Secondary | ICD-10-CM | POA: Diagnosis not present

## 2016-09-15 NOTE — Progress Notes (Signed)
Daily Group Progress Note     Program: IOP   Group Time: 10:45-12:00  Participation Level: Minimal  Behavioral Response: Quiet  Type of Therapy:  Psychoeducation/Therapy  Summary of Progress: Pt participated in a discussion on "How do I describe my depression to my loved ones? Pt participated in an activity drawing what her depression looks like. Pt shared  Its hard to draw her depression as no one in her life really knows how her depression really feels and it's hard to explain. She drew 3 people on one side of the page all with happy faces. She drew 1 person on the other side of the page with a picture of a hurricane and a red brick wall, which depicts the pt's depression. Pt was challenged to take the picture home and talk to her husband about her depression, using the picture. Pt accepted the challenge.   Jenkins Rouge, LCAS

## 2016-09-16 ENCOUNTER — Other Ambulatory Visit (HOSPITAL_COMMUNITY): Payer: BLUE CROSS/BLUE SHIELD

## 2016-09-16 NOTE — Progress Notes (Signed)
    Daily Group Progress Note  Program: IOP  Group Time: 9:00-12:00   Participation Level:  active    Behavioral Response: engaged   Type of Therapy:  group therapy   Summary of Progress:  Pt enjoyed her holiday, and was happy that she did not have any explosions of anger. However, at one point during the holiday a nephew brought up an upsetting topic, and she feared she might snap at in him anger. Instead, she went outside and took some time away from the situation to recover her cool. After she felt much better. Pt also discussed work stressors. Later in session she responded positively to a mindfulness meditation that the group participated in.   Nancie Neas, LPC

## 2016-09-17 ENCOUNTER — Other Ambulatory Visit (HOSPITAL_COMMUNITY): Payer: BLUE CROSS/BLUE SHIELD | Admitting: Psychiatry

## 2016-09-17 ENCOUNTER — Encounter (HOSPITAL_COMMUNITY): Payer: Self-pay | Admitting: Psychiatry

## 2016-09-17 DIAGNOSIS — F339 Major depressive disorder, recurrent, unspecified: Secondary | ICD-10-CM | POA: Diagnosis not present

## 2016-09-17 DIAGNOSIS — F3160 Bipolar disorder, current episode mixed, unspecified: Secondary | ICD-10-CM

## 2016-09-17 NOTE — Progress Notes (Signed)
    Daily Group Progress Note  Program: IOP  Group Time: 9:00-10:45  Participation Level: Active  Behavioral Response: Appropriate  Type of Therapy:  Group Therapy  Summary of Progress: Pt. Reported that she was doing "good". Pt. Listened to others process grief and shared her thoughts about grief that she believes that she will never get over the loss of her loved one and that it has not gotten easier for her. Pt. Shared with the group that she still does not know how to process her anger. Counselor made observation that her anger tends to center around others not meeting her high expectations and difficulty accepting the imperfections of others.       Nancie Neas, LPC

## 2016-09-17 NOTE — Patient Instructions (Addendum)
Patient completed MH-IOP today.  Will follow up with Valora Piccolo, [place] on 09-21-16 and Dr. Modesta Messing on 10-05-16.  Encouraged support groups.  RTW on 09-28-16; without any restrictions.

## 2016-09-17 NOTE — Progress Notes (Signed)
Erika Williamson is a This is a 43 married, employed, Caucasian female, who was referred per Dr. Gretel Acre; treatment for worsening depressive and anxiety symptoms with passive SI. Currently denies any SI.  Pt c/o feeling very angry upon admission.  Stated she'd been feeling this way for a couple of weeks.  Patient reports feeling depressed all her life; but states symptoms started to get worse in June 2017.  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  States that the workload has increased.  "I am expected to do too much and we've been very busy."  Reports store manager is understanding, but he is being pulled to cover other stores, so he's not available.  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  Feels overwhelmed  3)  Unresolved grief/loss issues:  On  03-29-16 her best friend's 5 yr old son died in a MVA.  Then in September 11, 2023 her husband's brother-in-law died in [place].  Wasn't close to him, but it brought back grief issues from best friend's son dying.  Pt denies any prior psychiatric hospitalizations.  Has been seeing Royal Piedra, LCSW and Dr. Gretel Acre on an outpatient basis.  Attempted suicide at age 21 via OD.  Family Hx:  Father, P-GM, P-GF (ETOH); P-GF (Drugs); Sister (Depression).  *Pt is well known to writer due to being admitted on 02-26-16 in [place].  CC:  previous notes for detailed hx. Pt will discharge today.  Overall mood stabilizing.  Pt is fearing her mood swings will return.  She is currently receiving physical therapy for her neck.  Denies SI/HI or A/V hallucinations.  A:  D/C today.  F/U with Valora Piccolo, LPC on 09-21-16 and Dr. Modesta Messing on 10-05-16.  RTW on 09-28-16 without any restrictions.  Encouraged support groups.  Recommended [facility] with Binnie Rail, LCAS.  R:  Pt receptive.                                                                                                                                                                                                                                            Carlis Abbott, RITA, M.Ed, CNA

## 2016-09-17 NOTE — Progress Notes (Signed)
[facility] DISCHARGE NOTE  Patient:  Erika Williamson DOB:  16-Jan-1976  Date of Admission:08/26/2016   Date of Discharge:09/17/2016   Reason for Admission:depression  IOP Course:attended and participated.  Leaves less depressed and much less irritable.  Still has chronic neck pain that makes depression worse and the stress of her job persists.  She seems better able to handle the stress at the moment  Mental Status at Discharge:no suicidal thoughts  Diagnosismajor depression, recurrent severe without psychotic thoughts  Level of Care:  IOP  Discharge destination: follow up with Dr Modesta Messing and Valora Piccolo     Comments:  none  The patient received suicide prevention pamphlet:  Yes   Donnelly Angelica, MD

## 2016-09-18 ENCOUNTER — Other Ambulatory Visit (HOSPITAL_COMMUNITY): Payer: BLUE CROSS/BLUE SHIELD | Admitting: Psychiatry

## 2016-09-21 ENCOUNTER — Other Ambulatory Visit (HOSPITAL_COMMUNITY): Payer: BLUE CROSS/BLUE SHIELD

## 2016-09-21 NOTE — Progress Notes (Signed)
    Daily Group Progress Note  Program: IOP Group Time: 9:00-12:00   Participation Level: active   Behavioral Response:  responsive   Type of Therapy:  group therapy   Summary of Progress: Today was the patients discharge day. She has taken the next week off of work, and has numerous household projects she plans to engage in to occupy her free time. She continues to feel that work is a major stressor, however when asked by counselor about what she likes about work she cited numerous positive factors. Though she feels she's made progress in the group she continues to feel her husband does not understand her depression, and this upsets her.     Nancie Neas, LPC

## 2016-09-22 ENCOUNTER — Other Ambulatory Visit (HOSPITAL_COMMUNITY): Payer: BLUE CROSS/BLUE SHIELD

## 2016-09-23 ENCOUNTER — Other Ambulatory Visit (HOSPITAL_COMMUNITY): Payer: BLUE CROSS/BLUE SHIELD

## 2016-09-24 ENCOUNTER — Other Ambulatory Visit (HOSPITAL_COMMUNITY): Payer: BLUE CROSS/BLUE SHIELD

## 2016-09-25 ENCOUNTER — Other Ambulatory Visit (HOSPITAL_COMMUNITY): Payer: BLUE CROSS/BLUE SHIELD

## 2016-09-28 ENCOUNTER — Other Ambulatory Visit (HOSPITAL_COMMUNITY): Payer: BLUE CROSS/BLUE SHIELD

## 2016-09-29 ENCOUNTER — Other Ambulatory Visit (HOSPITAL_COMMUNITY): Payer: BLUE CROSS/BLUE SHIELD

## 2016-09-30 ENCOUNTER — Ambulatory Visit: Payer: BLUE CROSS/BLUE SHIELD | Admitting: Psychiatry

## 2016-10-01 ENCOUNTER — Other Ambulatory Visit (HOSPITAL_COMMUNITY): Payer: BLUE CROSS/BLUE SHIELD

## 2016-10-01 ENCOUNTER — Telehealth: Payer: Self-pay | Admitting: Obstetrics and Gynecology
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
## 2016-10-01 NOTE — Telephone Encounter (Signed)
Pharmacy called and they are needing to know if they need to put her Lupron on hold. They were in contact with her insurance for prior auth and they stated that the insurance denied her RX for Lupron on 07/28/16/ They wanted to know if there has been an appeal put in for this or not. pharmacy is requesting a call back.

## 2016-10-02 ENCOUNTER — Other Ambulatory Visit (HOSPITAL_COMMUNITY): Payer: BLUE CROSS/BLUE SHIELD

## 2016-10-05 ENCOUNTER — Encounter (HOSPITAL_COMMUNITY): Payer: Self-pay | Admitting: Psychiatry

## 2016-10-05 ENCOUNTER — Other Ambulatory Visit (HOSPITAL_COMMUNITY): Payer: BLUE CROSS/BLUE SHIELD

## 2016-10-05 ENCOUNTER — Ambulatory Visit (INDEPENDENT_AMBULATORY_CARE_PROVIDER_SITE_OTHER): Payer: BLUE CROSS/BLUE SHIELD | Admitting: Psychiatry

## 2016-10-05 VITALS — BP 110/70 | HR 94 | Ht 63.5 in | Wt 183.0 lb

## 2016-10-05 DIAGNOSIS — F17210 Nicotine dependence, cigarettes, uncomplicated: Secondary | ICD-10-CM

## 2016-10-05 DIAGNOSIS — Z811 Family history of alcohol abuse and dependence: Secondary | ICD-10-CM

## 2016-10-05 DIAGNOSIS — Z79899 Other long term (current) drug therapy: Secondary | ICD-10-CM

## 2016-10-05 DIAGNOSIS — Z8249 Family history of ischemic heart disease and other diseases of the circulatory system: Secondary | ICD-10-CM | POA: Diagnosis not present

## 2016-10-05 DIAGNOSIS — R45851 Suicidal ideations: Secondary | ICD-10-CM

## 2016-10-05 DIAGNOSIS — Z833 Family history of diabetes mellitus: Secondary | ICD-10-CM | POA: Diagnosis not present

## 2016-10-05 DIAGNOSIS — Z8489 Family history of other specified conditions: Secondary | ICD-10-CM

## 2016-10-05 DIAGNOSIS — Z823 Family history of stroke: Secondary | ICD-10-CM

## 2016-10-05 DIAGNOSIS — Z888 Allergy status to other drugs, medicaments and biological substances status: Secondary | ICD-10-CM

## 2016-10-05 DIAGNOSIS — F331 Major depressive disorder, recurrent, moderate: Secondary | ICD-10-CM

## 2016-10-05 DIAGNOSIS — Z818 Family history of other mental and behavioral disorders: Secondary | ICD-10-CM

## 2016-10-05 DIAGNOSIS — Z9889 Other specified postprocedural states: Secondary | ICD-10-CM

## 2016-10-05 MED ORDER — LAMOTRIGINE 150 MG PO TABS: 150.0000 mg | ORAL_TABLET | Freq: Every day | ORAL | 1 refills | Status: DC

## 2016-10-05 MED ORDER — ESCITALOPRAM OXALATE 20 MG PO TABS: 20.0000 mg | ORAL_TABLET | Freq: Every day | ORAL | 1 refills | Status: DC

## 2016-10-05 NOTE — Patient Instructions (Signed)
1. Increase escitalopram 20 mg daily 2. Increase lamotrigine 150 mg daily 3. Return to clinic in February

## 2016-10-06 ENCOUNTER — Other Ambulatory Visit (HOSPITAL_COMMUNITY): Payer: BLUE CROSS/BLUE SHIELD

## 2016-10-07 ENCOUNTER — Other Ambulatory Visit (HOSPITAL_COMMUNITY): Payer: BLUE CROSS/BLUE SHIELD

## 2016-10-08 ENCOUNTER — Other Ambulatory Visit (HOSPITAL_COMMUNITY): Payer: BLUE CROSS/BLUE SHIELD

## 2016-10-09 ENCOUNTER — Other Ambulatory Visit (HOSPITAL_COMMUNITY): Payer: BLUE CROSS/BLUE SHIELD

## 2016-10-13 ENCOUNTER — Other Ambulatory Visit (HOSPITAL_COMMUNITY): Payer: BLUE CROSS/BLUE SHIELD

## 2016-10-14 ENCOUNTER — Other Ambulatory Visit (HOSPITAL_COMMUNITY): Payer: BLUE CROSS/BLUE SHIELD

## 2016-10-14 NOTE — Telephone Encounter (Signed)
Called pt no answer. LM for pt informing her that I have been working on getting her Lupron approved for the past several weeks with no success. Advised pt that Lupron was recently denied again, and that I would be working on another appeal. Did inform pt that she could choose to pay cash for the medication. Advised pt to call back with any questions or concerns.

## 2016-10-15 ENCOUNTER — Other Ambulatory Visit (HOSPITAL_COMMUNITY): Payer: BLUE CROSS/BLUE SHIELD

## 2016-10-16 ENCOUNTER — Other Ambulatory Visit (HOSPITAL_COMMUNITY): Payer: BLUE CROSS/BLUE SHIELD

## 2016-10-20 ENCOUNTER — Other Ambulatory Visit (HOSPITAL_COMMUNITY): Payer: BLUE CROSS/BLUE SHIELD
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
## 2016-11-23 ENCOUNTER — Encounter (HOSPITAL_COMMUNITY): Payer: Self-pay | Admitting: Psychiatry

## 2016-11-23 ENCOUNTER — Ambulatory Visit (INDEPENDENT_AMBULATORY_CARE_PROVIDER_SITE_OTHER): Payer: BLUE CROSS/BLUE SHIELD | Admitting: Psychiatry

## 2016-11-23 VITALS — BP 126/74 | HR 92 | Ht 63.5 in | Wt 181.8 lb

## 2016-11-23 DIAGNOSIS — Z818 Family history of other mental and behavioral disorders: Secondary | ICD-10-CM

## 2016-11-23 DIAGNOSIS — Z8262 Family history of osteoporosis: Secondary | ICD-10-CM

## 2016-11-23 DIAGNOSIS — Z833 Family history of diabetes mellitus: Secondary | ICD-10-CM

## 2016-11-23 DIAGNOSIS — Z9049 Acquired absence of other specified parts of digestive tract: Secondary | ICD-10-CM

## 2016-11-23 DIAGNOSIS — Z9851 Tubal ligation status: Secondary | ICD-10-CM

## 2016-11-23 DIAGNOSIS — Z79899 Other long term (current) drug therapy: Secondary | ICD-10-CM

## 2016-11-23 DIAGNOSIS — Z823 Family history of stroke: Secondary | ICD-10-CM

## 2016-11-23 DIAGNOSIS — Z801 Family history of malignant neoplasm of trachea, bronchus and lung: Secondary | ICD-10-CM

## 2016-11-23 DIAGNOSIS — Z9889 Other specified postprocedural states: Secondary | ICD-10-CM

## 2016-11-23 DIAGNOSIS — Z811 Family history of alcohol abuse and dependence: Secondary | ICD-10-CM

## 2016-11-23 DIAGNOSIS — F331 Major depressive disorder, recurrent, moderate: Secondary | ICD-10-CM | POA: Diagnosis not present

## 2016-11-23 DIAGNOSIS — F17210 Nicotine dependence, cigarettes, uncomplicated: Secondary | ICD-10-CM

## 2016-11-23 DIAGNOSIS — Z888 Allergy status to other drugs, medicaments and biological substances status: Secondary | ICD-10-CM

## 2016-11-23 DIAGNOSIS — Z825 Family history of asthma and other chronic lower respiratory diseases: Secondary | ICD-10-CM

## 2016-11-23 DIAGNOSIS — Z8249 Family history of ischemic heart disease and other diseases of the circulatory system: Secondary | ICD-10-CM

## 2016-11-23 MED ORDER — ESCITALOPRAM OXALATE 20 MG PO TABS: 20.0000 mg | ORAL_TABLET | Freq: Every day | ORAL | 1 refills | Status: DC

## 2016-11-23 MED ORDER — LAMOTRIGINE 150 MG PO TABS: 150.0000 mg | ORAL_TABLET | Freq: Every day | ORAL | 1 refills | Status: DC

## 2016-11-23 NOTE — Patient Instructions (Addendum)
1. Continue escitalopram 20 mg daily 2. Continue lamotrigine 150 mg daily 3. Return to clinic in two months

## 2017-01-27 ENCOUNTER — Ambulatory Visit (HOSPITAL_COMMUNITY): Payer: Self-pay | Admitting: Psychiatry

## 2017-02-10 ENCOUNTER — Ambulatory Visit (INDEPENDENT_AMBULATORY_CARE_PROVIDER_SITE_OTHER): Payer: BLUE CROSS/BLUE SHIELD | Admitting: Psychiatry

## 2017-02-10 ENCOUNTER — Encounter (HOSPITAL_COMMUNITY): Payer: Self-pay | Admitting: Psychiatry

## 2017-02-10 VITALS — BP 130/74 | HR 82 | Ht 63.5 in | Wt 179.6 lb

## 2017-02-10 DIAGNOSIS — F17210 Nicotine dependence, cigarettes, uncomplicated: Secondary | ICD-10-CM

## 2017-02-10 DIAGNOSIS — Z79899 Other long term (current) drug therapy: Secondary | ICD-10-CM

## 2017-02-10 DIAGNOSIS — Z818 Family history of other mental and behavioral disorders: Secondary | ICD-10-CM

## 2017-02-10 DIAGNOSIS — F4312 Post-traumatic stress disorder, chronic: Secondary | ICD-10-CM | POA: Diagnosis not present

## 2017-02-10 DIAGNOSIS — Z811 Family history of alcohol abuse and dependence: Secondary | ICD-10-CM

## 2017-02-10 MED ORDER — LAMOTRIGINE 150 MG PO TABS: 150.0000 mg | ORAL_TABLET | Freq: Every day | ORAL | 1 refills | Status: DC

## 2017-02-10 MED ORDER — PAROXETINE HCL 20 MG PO TABS: 20.0000 mg | ORAL_TABLET | Freq: Every day | ORAL | 1 refills | Status: DC

## 2017-02-10 NOTE — Progress Notes (Signed)
[place] MD/PA/NP OP Progress Note  02/10/2017 4:08 PM Erika Williamson  MRN:  [id number]  Chief Complaint: anger  Subjective:  Erika Williamson presents today for psychiatric follow-up. She reports that she is continued on Lamictal and Lexapro. Feels like Lexapro has lost some of its effect in terms of anxiety and depression controlled. She continues to struggle with feelings of irritability and anger, with significant rumination. No ideation. She denies any alcohol or drug abuse. I spent time learning about her social life, her work at [employer], her work at the [employer], and her relationship with her daughter and son. She admits to a difficult upbringing, and has formed a thick skin over the years. We spent time talking about PTSD. We agreed to switch to Paxil 20 mg, and reviewed the risks and benefits of this. She will remain on Lamictal at the current dose.  Agrees to follow-up in 6-8 weeks.  Visit Diagnosis:    ICD-9-CM ICD-10-CM   1. Chronic post-traumatic stress disorder (PTSD) 309.81 F43.12 PARoxetine (PAXIL) 20 MG tablet     lamoTRIgine (LAMICTAL) 150 MG tablet   Past Psychiatric History: See intake H&P for full details. Reviewed, with no updates at this time.  Past Medical History:  Past Medical History:  Diagnosis Date  . 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 Anxiety   . Atypical face pain    Left Sided  . COPD (chronic obstructive pulmonary disease) ([place])   . Depression   . Foot fracture Left X2  . GERD (gastroesophageal reflux disease)   . HPV (human papilloma virus) infection   . Migraine without aura 04/03/2016  . Moderate recurrent major depression ([place]) 07/22/2015  . Shingles   . Sinusitis   . Tension headache   . Tobacco abuse   . Tonsillitis   . Vitamin D deficiency disease     Past Surgical History:  Procedure Laterality Date  . CESAREAN SECTION     X2  . CHOLECYSTECTOMY     Dr Burt Knack  .  COLONOSCOPY WITH PROPOFOL N/A 05/03/2015   Procedure: COLONOSCOPY WITH PROPOFOL;  Surgeon: Josefine Class, MD;  Location: [facility] ENDOSCOPY;  Service: Endoscopy;  Laterality: N/A;  . ESOPHAGOGASTRODUODENOSCOPY N/A 05/03/2015   Procedure: ESOPHAGOGASTRODUODENOSCOPY (EGD);  Surgeon: Josefine Class, MD;  Location: [facility] ENDOSCOPY;  Service: Endoscopy;  Laterality: N/A;  . 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 TONSILLECTOMY    . TUBAL LIGATION    . WISDOM TOOTH EXTRACTION      Family Psychiatric History: See intake H&P for full details. Reviewed, with no updates at this time.   Family History:  Family History  Problem Relation Age of Onset  . Diabetes Mother   . Hypertension Mother   . COPD Mother   . Stroke Mother   . Osteoporosis Mother   . Depression Mother   . Cancer Father 46    colorectal, spread to lung  . Alcohol abuse Father   . Hypertension Father   . Depression Father   . Diabetes Sister   . COPD Sister   . Anxiety disorder Sister   . Depression Sister   . Fibromyalgia Sister   . Asthma Sister   . ADD / ADHD Son   . ADD / ADHD Daughter     Social History:  Social History   Social History  . Marital status: Married    Spouse name: N/A  .  Number of children: N/A  . Years of education: N/A   Social History Main Topics  . Smoking status: Current Every Day Smoker    Packs/day: 1.00    Years: 22.00    Types: Cigarettes    Start date: 09/03/1993  . Smokeless tobacco: Never Used  . Alcohol use No  . Drug use: No  . Sexual activity: Not Currently    Birth control/ protection: IUD   Other Topics Concern  . None   Social History Narrative  . None    Allergies:  Allergies  Allergen Reactions  . Gabapentin Anaphylaxis  . Ivp Dye [Iodinated Diagnostic Agents]   . Acyclovir And Related   . Compazine [Prochlorperazine Edisylate]   . Erythromycin   . Levaquin [Levofloxacin In D5w]   . Levofloxacin Other (See Comments)  . Naprosyn [Naproxen]   . Nsaids    . Pristiq [Desvenlafaxine]   . Tramadol Other (See Comments)  . Ultram [Tramadol Hcl]     Metabolic Disorder Labs: No results found for: HGBA1C, MPG No results found for: PROLACTIN No results found for: CHOL, TRIG, HDL, CHOLHDL, VLDL, LDLCALC   Current Medications: Current Outpatient Prescriptions  Medication Sig Dispense Refill  . baclofen (LIORESAL) 10 MG tablet Take 10 mg by mouth 2 (two) times daily.    . halobetasol (ULTRAVATE) 0.05 % cream Apply 0.05 mg topically 2 (two) times daily.    Marland Kitchen lamoTRIgine (LAMICTAL) 150 MG tablet Take 1 tablet (150 mg total) by mouth daily. 90 tablet 1  . levonorgestrel (MIRENA) 20 MCG/24HR IUD 1 each by Intrauterine route once.    . rizatriptan (MAXALT) 10 MG tablet     . PARoxetine (PAXIL) 20 MG tablet Take 1 tablet (20 mg total) by mouth daily. 90 tablet 1   No current facility-administered medications for this visit.     Neurologic: Headache: Negative Seizure: Negative Paresthesias: Negative  Musculoskeletal: Strength & Muscle Tone: within normal limits Gait & Station: normal Patient leans: N/A  Psychiatric Specialty Exam: ROS  Blood pressure 130/74, pulse 82, height 5' 3.5" (1.613 m), weight 179 lb 9.6 oz (81.5 kg).Body mass index is 31.32 kg/m.  General Appearance: Casual and Well Groomed  Eye Contact:  Good  Speech:  Clear and Coherent  Volume:  Normal  Mood:  Anxious  Affect:  Congruent  Thought Process:  Goal Directed  Orientation:  Full (Time, Place, and Person)  Thought Content: Logical   Suicidal Thoughts:  No  Homicidal Thoughts:  No  Memory:  Immediate;   Fair  Judgement:  Fair  Insight:  Fair  Psychomotor Activity:  Normal  Concentration:  Concentration: Good  Recall:  Good  Fund of Knowledge: Good  Language: Good  Akathisia:  Negative  Handed:  Right  AIMS (if indicated):  0  Assets:  Communication Skills Desire for Improvement Financial Resources/Insurance Housing Intimacy Leisure Time Physical  Health Resilience Social Support Talents/Skills Transportation Vocational/Educational  ADL's:  Intact  Cognition: WNL  Sleep:  7-9 hours   Treatment Plan Summary: Erika Williamson is a 41 year old female with a history of lupus, major depressive disorder, and PTSD. She presents today for psychiatric follow-up. This is our first meeting together, as she is transferring providers due to her previous psychiatrist moving clinic locations. The patient presents on Lexapro and Lamictal, but has had some resurgence of depressive symptoms in the setting of ongoing family and work stressors. We agreed to switch from Lexapro to Paxil, for superior anxiety and mood control. She denies  any suicidality. No substance abuse.  We'll follow-up in 6 weeks.  1. Chronic post-traumatic stress disorder (PTSD)    Discontinue Lexapro Initiate Paxil 10 mg 3 days, then 20 mg Continue Lamictal 150 mg daily Follow-up in 6-8 weeks  Aundra Dubin, MD 02/10/2017, 4:08 PM

## 2017-02-22 ENCOUNTER — Telehealth (HOSPITAL_COMMUNITY): Payer: Self-pay

## 2017-02-22 NOTE — Telephone Encounter (Signed)
Medication problem - Telephone call with patient to follow up on message she left that since medication change to Paxil she has been more agitated and frustrated.Does not like the way she is feeling at all with new medication and states even her store manager noticed irritability and sent her home.  Informed Dr. Daron Offer is out this week on vacation so would follow up with Dr. Lovena Le on 02/23/17 as patient agreed with plan.  Patient to call back if has not heard back by noon on 02/23/17 and denies suicidal or homicidal ideations but just increased agitation and more "on edge".

## 2017-02-23 ENCOUNTER — Other Ambulatory Visit (HOSPITAL_COMMUNITY): Payer: Self-pay | Admitting: Psychiatry

## 2017-02-23 MED ORDER — LORAZEPAM 0.5 MG PO TABS: 0.5000 mg | ORAL_TABLET | Freq: Three times a day (TID) | ORAL | 0 refills | Status: DC | PRN

## 2017-02-23 NOTE — Progress Notes (Unsigned)
lorazepam

## 2017-02-23 NOTE — Telephone Encounter (Signed)
Reminded that she is likely having withdrawal from the escitalopram and the paroxetine has not started working yet. She is not able to do her job due to the irritability and anxiety she says.  Will try lorazepam 0.5 mg tid prn to see if that helps while waiting for the paroxetine to work.  Call in 45 tablets of lorazepam 0.5 mg tid prn.

## 2017-03-01 ENCOUNTER — Telehealth (HOSPITAL_COMMUNITY): Payer: Self-pay

## 2017-03-01 DIAGNOSIS — F4312 Post-traumatic stress disorder, chronic: Secondary | ICD-10-CM

## 2017-03-01 MED ORDER — GUANFACINE HCL ER 1 MG PO TB24: ORAL_TABLET | ORAL | 0 refills | Status: DC

## 2017-03-01 MED ORDER — PAROXETINE HCL 20 MG PO TABS: 40.0000 mg | ORAL_TABLET | Freq: Every day | ORAL | 0 refills | Status: DC

## 2017-03-01 NOTE — Telephone Encounter (Signed)
I called the patient back and lvm for her to call me back.

## 2017-03-01 NOTE — Telephone Encounter (Signed)
Patient called while you were out due to increased anxiety and stress. Dr. Lovena Le reviewed the chart and put patient on a low dose of Ativan to help while waiting for the Paroxetine she started to begin working. Patient called this morning and said that did not help at all. She is feeling more irritated and agitated than before and she is concerned that she could snap on a customer. She would like to know what you suggest as the Ativan did nothing but make her sleepy.

## 2017-03-01 NOTE — Telephone Encounter (Signed)
Spoke with patient, she is going to increase the Paxil to 40 mg, she had not done so yet and start Intuniv 1 mg at night. Patient asked about FMLA and I told her to have her HR dept. Fax the paperwork and I would talk to you. Patient will try the new medication regimen and call me back of Friday or Monday to let me know how she is doing.

## 2017-03-01 NOTE — Telephone Encounter (Signed)
Excellent thank you

## 2017-03-01 NOTE — Telephone Encounter (Signed)
Has she already increase the paxil to 40 mg?   That was on the instructtion sheet and what we discussed, so want to make sure she already did that.  I'd suggest she discard the ativan.  If you could find out, what time of the day is her anxiety the worst?

## 2017-04-12 ENCOUNTER — Encounter (HOSPITAL_COMMUNITY): Payer: Self-pay | Admitting: Psychiatry

## 2017-04-12 ENCOUNTER — Ambulatory Visit (INDEPENDENT_AMBULATORY_CARE_PROVIDER_SITE_OTHER): Payer: BLUE CROSS/BLUE SHIELD | Admitting: Psychiatry

## 2017-04-12 VITALS — BP 116/78 | HR 73 | Ht 63.5 in | Wt 173.0 lb

## 2017-04-12 DIAGNOSIS — Z818 Family history of other mental and behavioral disorders: Secondary | ICD-10-CM | POA: Diagnosis not present

## 2017-04-12 DIAGNOSIS — F331 Major depressive disorder, recurrent, moderate: Secondary | ICD-10-CM | POA: Diagnosis not present

## 2017-04-12 DIAGNOSIS — F609 Personality disorder, unspecified: Secondary | ICD-10-CM

## 2017-04-12 DIAGNOSIS — Z811 Family history of alcohol abuse and dependence: Secondary | ICD-10-CM | POA: Diagnosis not present

## 2017-04-12 DIAGNOSIS — F4312 Post-traumatic stress disorder, chronic: Secondary | ICD-10-CM | POA: Diagnosis not present

## 2017-04-12 DIAGNOSIS — F17210 Nicotine dependence, cigarettes, uncomplicated: Secondary | ICD-10-CM

## 2017-04-12 MED ORDER — BREXPIPRAZOLE 0.5 MG PO TABS: 0.5000 mg | ORAL_TABLET | Freq: Every day | ORAL | 2 refills | Status: DC

## 2017-04-12 MED ORDER — TRAZODONE HCL 50 MG PO TABS: 50.0000 mg | ORAL_TABLET | Freq: Every day | ORAL | 2 refills | Status: DC

## 2017-04-12 NOTE — Progress Notes (Signed)
[place] MD/PA/NP OP Progress Note  04/12/2017 11:04 AM Erika Williamson  MRN:  [id number]  Chief Complaint:  Chief Complaint    Follow-up    med management  Subjective:  Erika Williamson reports that she continues to struggle with depressed mood. She reports that she is taking Paxil 40 mg daily. She is also continuing on Lamictal 150 mg daily. She tried the guanfacine, but this made her very foggy headed. She also tried Ativan 0.5 mg tablet and this made her sleep for about 16 hours. We discussed using trazodone at a small dose to help with her sleeping at night. She reports that she continues to struggle with restlessness at night and wakes up in the middle the night feeling angry.  She reports that she continues to struggle with apathy and depressed mood. She feels "what's the point" with regard to some of her obligations to her family and her job. She denies any thoughts to want to hurt herself, but reports that "she would be okay with that if "GOd  just took me."  Spent time discussing the signs and symptoms we should recognize for her if she needs to return to the IOP program. We discussed initiating Brexpiprazole 0.5 mg tablet for augmenting her antidepressant regimen. She has already been tried on Abilify 2 mg tablet daily in the past, and this caused her to feel foggy headed.  She agrees to follow-up in 2 weeks.  Visit Diagnosis:    ICD-10-CM   1. Chronic post-traumatic stress disorder (PTSD) F43.12 Brexpiprazole (REXULTI) 0.5 MG TABS    traZODone (DESYREL) 50 MG tablet  2. Personality disorder F60.9   3. MDD (major depressive disorder), recurrent episode, moderate (HCC) F33.1 Brexpiprazole (REXULTI) 0.5 MG TABS    traZODone (DESYREL) 50 MG tablet    Past Psychiatric History: See intake H&P for full details. Reviewed, with no updates at this time.  Past Medical History:  Past Medical History:  Diagnosis Date  . Abnormal cervical Papanicolaou smear 08/2013   LGSIL pap, CIN II  on biopsy with colposcopy  . Anxiety   . Atypical face pain    Left Sided  . COPD (chronic obstructive pulmonary disease) ([place])   . Depression   . Foot fracture Left X2  . GERD (gastroesophageal reflux disease)   . HPV (human papilloma virus) infection   . Migraine without aura 04/03/2016  . Moderate recurrent major depression ([place]) 07/22/2015  . Shingles   . Sinusitis   . Tension headache   . Tobacco abuse   . Tonsillitis   . Vitamin D deficiency disease     Past Surgical History:  Procedure Laterality Date  . CESAREAN SECTION     X2  . CHOLECYSTECTOMY     Dr Burt Knack  . COLONOSCOPY WITH PROPOFOL N/A 05/03/2015   Procedure: COLONOSCOPY WITH PROPOFOL;  Surgeon: Josefine Class, MD;  Location: [facility] ENDOSCOPY;  Service: Endoscopy;  Laterality: N/A;  . ESOPHAGOGASTRODUODENOSCOPY N/A 05/03/2015   Procedure: ESOPHAGOGASTRODUODENOSCOPY (EGD);  Surgeon: Josefine Class, MD;  [facility] ENDOSCOPY;  Service: Endoscopy;  Laterality: N/A;  . 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 TONSILLECTOMY    . TUBAL LIGATION    . WISDOM TOOTH EXTRACTION      Family Psychiatric History: See intake H&P for full details. Reviewed, with no updates at this time.   Family History:  Family History  Problem Relation Age of Onset  . Diabetes Mother   . Hypertension Mother   .  COPD Mother   . Stroke Mother   . Osteoporosis Mother   . Depression Mother   . Cancer Father 77       colorectal, spread to lung  . Alcohol abuse Father   . Hypertension Father   . Depression Father   . Diabetes Sister   . COPD Sister   . Anxiety disorder Sister   . Depression Sister   . Fibromyalgia Sister   . Asthma Sister   . ADD / ADHD Son   . ADD / ADHD Daughter     Social History:  Social History   Social History  . Marital status: Married    Spouse name: N/A  . Number of children: N/A  . Years of education: N/A   Social History Main Topics  . Smoking status: Current Every Day Smoker    Packs/day:  1.00    Years: 22.00    Types: Cigarettes    Start date: 09/03/1993  . Smokeless tobacco: Never Used  . Alcohol use No  . Drug use: No  . Sexual activity: Yes    Partners: Male    Birth control/ protection: IUD   Other Topics Concern  . None   Social History Narrative  . None    Allergies:  Allergies  Allergen Reactions  . Gabapentin Anaphylaxis  . Ivp Dye [Iodinated Diagnostic Agents]   . Acyclovir And Related   . Compazine [Prochlorperazine Edisylate]   . Erythromycin   . Levaquin [Levofloxacin In D5w]   . Levofloxacin Other (See Comments)  . Naprosyn [Naproxen]   . Nsaids   . Pristiq [Desvenlafaxine]   . Tramadol Other (See Comments)  . Ultram [Tramadol Hcl]     Metabolic Disorder Labs: No results found for: HGBA1C, MPG No results found for: PROLACTIN No results found for: CHOL, TRIG, HDL, CHOLHDL, VLDL, LDLCALC   Current Medications: Current Outpatient Prescriptions  Medication Sig Dispense Refill  . baclofen (LIORESAL) 10 MG tablet Take 10 mg by mouth 2 (two) times daily.    . halobetasol (ULTRAVATE) 0.05 % cream Apply 0.05 mg topically 2 (two) times daily.    Marland Kitchen lamoTRIgine (LAMICTAL) 150 MG tablet Take 1 tablet (150 mg total) by mouth daily. 90 tablet 1  . levonorgestrel (MIRENA) 20 MCG/24HR IUD 1 each by Intrauterine route once.    Marland Kitchen PARoxetine (PAXIL) 20 MG tablet Take 2 tablets (40 mg total) by mouth daily. 180 tablet 0  . rizatriptan (MAXALT) 10 MG tablet     . Brexpiprazole (REXULTI) 0.5 MG TABS Take 1 tablet (0.5 mg total) by mouth at bedtime. 30 tablet 2  . traZODone (DESYREL) 50 MG tablet Take 1 tablet (50 mg total) by mouth at bedtime. 30 tablet 2   No current facility-administered medications for this visit.     Neurologic: Headache: Negative Seizure: Negative Paresthesias: Negative  Musculoskeletal: Strength & Muscle Tone: within normal limits Gait & Station: normal Patient leans: N/A  Psychiatric Specialty Exam: ROS  Blood pressure  116/78, pulse 73, height 5' 3.5" (1.613 m), weight 173 lb (78.5 kg).Body mass index is 30.16 kg/m.  General Appearance: Casual  Eye Contact:  Good  Speech:  Clear and Coherent  Volume:  Normal  Mood:  Depressed  Affect:  Depressed  Thought Process:  Goal Directed  Orientation:  Full (Time, Place, and Person)  Thought Content: Logical   Suicidal Thoughts:  No  Homicidal Thoughts:  No  Memory:  Immediate;   Fair  Judgement:  Fair  Insight:  Fair  Psychomotor Activity:  Normal  Concentration:  Attention Span: Good  Recall:  Good  Fund of Knowledge: Fair  Language: Fair  Akathisia:  Negative  Handed:  Right  AIMS (if indicated):  0  Assets:  Communication Skills Desire for Improvement Financial Resources/Insurance Housing Intimacy Social Support Transportation  ADL's:  Intact  Cognition: WNL  Sleep:  4-5 hours    Treatment Plan Summary: Athalie Newhard Raker is a 41 year old female with a history of PTSD, depression, and personality disorder. She presents today for medication management follow-up.  She has had increasing stressors in the setting of marital discord.  She is working in individual therapy, and is agreeable to medication changes as below, and follow-up in 2 weeks. She does not present with any acute safety issues, and agrees to consider IOP if her mood was to worsen.  1. Chronic post-traumatic stress disorder (PTSD)   2. Personality disorder   3. MDD (major depressive disorder), recurrent episode, moderate (HCC)    Continue Paxil 40 mg daily Initiate rexulti 0.5 mg daily Initiate trazodone 25-50 mg nightly Discontinue Intuniv, patient not taking Discontinue Ativan, patient not taking Return to clinic in 2 weeks   Aundra Dubin, MD 04/12/2017, 11:04 AM

## 2017-04-14 ENCOUNTER — Telehealth (HOSPITAL_COMMUNITY): Payer: Self-pay

## 2017-04-14 NOTE — Telephone Encounter (Signed)
She has already been tried on Abilify.  We can try her on Seroquel 50 mg, and she should take that at night, as it will help with sleep and augment her antidepressant regimen.

## 2017-04-14 NOTE — Telephone Encounter (Signed)
Patient calling because she went to pick up the [place] and it is over a thousand dollars. Patient would like to know if you have any other options for her. Please review and advise, thank you

## 2017-04-15 MED ORDER — QUETIAPINE FUMARATE 50 MG PO TABS: 50.0000 mg | ORAL_TABLET | Freq: Every day | ORAL | 0 refills | Status: DC

## 2017-04-15 NOTE — Telephone Encounter (Signed)
Called patient and left a voicemail with the new prescription, I told patient to call me back with any questions.

## 2017-04-23 ENCOUNTER — Ambulatory Visit (INDEPENDENT_AMBULATORY_CARE_PROVIDER_SITE_OTHER): Payer: BLUE CROSS/BLUE SHIELD | Admitting: Psychiatry

## 2017-04-23 ENCOUNTER — Ambulatory Visit: Payer: Self-pay | Admitting: Family Medicine

## 2017-04-23 ENCOUNTER — Encounter (HOSPITAL_COMMUNITY): Payer: Self-pay | Admitting: Psychiatry

## 2017-04-23 DIAGNOSIS — F4312 Post-traumatic stress disorder, chronic: Secondary | ICD-10-CM

## 2017-04-23 DIAGNOSIS — Z811 Family history of alcohol abuse and dependence: Secondary | ICD-10-CM

## 2017-04-23 DIAGNOSIS — Z818 Family history of other mental and behavioral disorders: Secondary | ICD-10-CM | POA: Diagnosis not present

## 2017-04-23 DIAGNOSIS — F17210 Nicotine dependence, cigarettes, uncomplicated: Secondary | ICD-10-CM

## 2017-04-23 DIAGNOSIS — R45851 Suicidal ideations: Secondary | ICD-10-CM

## 2017-04-23 MED ORDER — MIRTAZAPINE 15 MG PO TABS: 15.0000 mg | ORAL_TABLET | Freq: Every day | ORAL | 2 refills | Status: DC

## 2017-04-23 MED ORDER — LAMOTRIGINE 200 MG PO TABS: 200.0000 mg | ORAL_TABLET | Freq: Every day | ORAL | 1 refills | Status: DC
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
## 2017-05-03 ENCOUNTER — Other Ambulatory Visit (HOSPITAL_COMMUNITY): Payer: BLUE CROSS/BLUE SHIELD | Attending: Psychiatry | Admitting: Psychiatry

## 2017-05-03 ENCOUNTER — Encounter (HOSPITAL_COMMUNITY): Payer: Self-pay | Admitting: Psychiatry

## 2017-05-03 DIAGNOSIS — J449 Chronic obstructive pulmonary disease, unspecified: Secondary | ICD-10-CM | POA: Insufficient documentation

## 2017-05-03 DIAGNOSIS — Z825 Family history of asthma and other chronic lower respiratory diseases: Secondary | ICD-10-CM | POA: Insufficient documentation

## 2017-05-03 DIAGNOSIS — G4700 Insomnia, unspecified: Secondary | ICD-10-CM | POA: Insufficient documentation

## 2017-05-03 DIAGNOSIS — F17210 Nicotine dependence, cigarettes, uncomplicated: Secondary | ICD-10-CM | POA: Insufficient documentation

## 2017-05-03 DIAGNOSIS — Z888 Allergy status to other drugs, medicaments and biological substances status: Secondary | ICD-10-CM | POA: Insufficient documentation

## 2017-05-03 DIAGNOSIS — Z79899 Other long term (current) drug therapy: Secondary | ICD-10-CM | POA: Insufficient documentation

## 2017-05-03 DIAGNOSIS — Z8249 Family history of ischemic heart disease and other diseases of the circulatory system: Secondary | ICD-10-CM | POA: Insufficient documentation

## 2017-05-03 DIAGNOSIS — Z811 Family history of alcohol abuse and dependence: Secondary | ICD-10-CM | POA: Insufficient documentation

## 2017-05-03 DIAGNOSIS — K219 Gastro-esophageal reflux disease without esophagitis: Secondary | ICD-10-CM | POA: Insufficient documentation

## 2017-05-03 DIAGNOSIS — Z833 Family history of diabetes mellitus: Secondary | ICD-10-CM | POA: Insufficient documentation

## 2017-05-03 DIAGNOSIS — F4312 Post-traumatic stress disorder, chronic: Secondary | ICD-10-CM | POA: Insufficient documentation

## 2017-05-03 DIAGNOSIS — F609 Personality disorder, unspecified: Secondary | ICD-10-CM

## 2017-05-03 DIAGNOSIS — R45851 Suicidal ideations: Secondary | ICD-10-CM | POA: Insufficient documentation

## 2017-05-03 DIAGNOSIS — Z823 Family history of stroke: Secondary | ICD-10-CM | POA: Insufficient documentation

## 2017-05-03 DIAGNOSIS — Z9889 Other specified postprocedural states: Secondary | ICD-10-CM | POA: Insufficient documentation

## 2017-05-03 DIAGNOSIS — Z801 Family history of malignant neoplasm of trachea, bronchus and lung: Secondary | ICD-10-CM | POA: Insufficient documentation

## 2017-05-03 DIAGNOSIS — E559 Vitamin D deficiency, unspecified: Secondary | ICD-10-CM | POA: Insufficient documentation

## 2017-05-03 DIAGNOSIS — Z9049 Acquired absence of other specified parts of digestive tract: Secondary | ICD-10-CM | POA: Insufficient documentation

## 2017-05-03 DIAGNOSIS — F329 Major depressive disorder, single episode, unspecified: Secondary | ICD-10-CM | POA: Insufficient documentation

## 2017-05-03 DIAGNOSIS — Z800 Family history of malignant neoplasm of digestive organs: Secondary | ICD-10-CM | POA: Insufficient documentation

## 2017-05-03 DIAGNOSIS — Z818 Family history of other mental and behavioral disorders: Secondary | ICD-10-CM | POA: Insufficient documentation

## 2017-05-03 NOTE — Progress Notes (Signed)
Jeniyah B Bible is a 41 y.o. married, employed, Caucasian female, who was referred per Dr. Daron Offer; treatment for worsening depressive and anxiety symptoms with passive SI. Currently denies any SI. Pt c/o feeling very irritable upon admission. Stated she'd been feeling this way for a couple of weeks. Patient reports feeling depressed all her life; but states symptoms started to get worse in March 2018. 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 "I had a complete meltdown last week at work.  My coworkers were trying to assist me but they ended up putting me one week behind.  According to pt, she had been on light duty from a fall she had back in March 2018, when she fell off a ladder and sprained her foot.  States she's been sitting at a desk beside her department; therefore she's been seeing how everything is pilling up.   2) Marriage:  Reports her irritability is affecting her marriage.  Also, c/o of poor communication.  Pt denies any prior psychiatric hospitalizations. Attempted suicide at age 63 via OD. Family Hx: Father, P-GM, P-GF (ETOH); P-GF (Drugs); Sister (Depression). *Pt is well known to writer due to being admitted on 02-26-16 in [place]. CC: previous notes for detailed hx. A:  Re-oriented pt to MH-IOP.  Informed Dr. Daron Offer and Valora Piccolo, [facility] of admit.  Encouraged support groups.  R:  Pt receptive.      Carlis Abbott, RITA, M.Ed, CNA

## 2017-05-03 NOTE — Progress Notes (Signed)
Daily Group Progress Note  Program: IOP  05/03/2017   Group Time: 9:00 - 10:30 AM   Participation Level: Active  Behavioral Response: Appropriate  Type of Therapy:  Psycho-education Group  Summary of Progress:  Patient was attentive to psycho- education group on pharmacology.      Group Time: 10:30 - 12 PM  Participation Level:  Active  Behavioral Response: Sharing and Monopolizing  Type of Therapy: Process Group  Summary of Progress: Patients had opportunity to process their feelings and behavioral responses to current life situation. Patient choose to share lengthy histories yet when redirected shared appropriately.    Sheilah Pigeon, LCSW

## 2017-05-04 ENCOUNTER — Other Ambulatory Visit (HOSPITAL_COMMUNITY): Payer: BLUE CROSS/BLUE SHIELD

## 2017-05-05 ENCOUNTER — Other Ambulatory Visit (HOSPITAL_COMMUNITY): Payer: BLUE CROSS/BLUE SHIELD | Admitting: Psychiatry

## 2017-05-05 DIAGNOSIS — R45851 Suicidal ideations: Secondary | ICD-10-CM | POA: Diagnosis not present

## 2017-05-05 DIAGNOSIS — G4700 Insomnia, unspecified: Secondary | ICD-10-CM | POA: Diagnosis not present

## 2017-05-05 DIAGNOSIS — F329 Major depressive disorder, single episode, unspecified: Secondary | ICD-10-CM | POA: Diagnosis not present

## 2017-05-05 DIAGNOSIS — F331 Major depressive disorder, recurrent, moderate: Secondary | ICD-10-CM

## 2017-05-05 DIAGNOSIS — K219 Gastro-esophageal reflux disease without esophagitis: Secondary | ICD-10-CM | POA: Diagnosis not present

## 2017-05-05 DIAGNOSIS — Z8249 Family history of ischemic heart disease and other diseases of the circulatory system: Secondary | ICD-10-CM | POA: Diagnosis not present

## 2017-05-05 DIAGNOSIS — Z9049 Acquired absence of other specified parts of digestive tract: Secondary | ICD-10-CM | POA: Diagnosis not present

## 2017-05-05 DIAGNOSIS — Z823 Family history of stroke: Secondary | ICD-10-CM | POA: Diagnosis not present

## 2017-05-05 DIAGNOSIS — F4312 Post-traumatic stress disorder, chronic: Secondary | ICD-10-CM

## 2017-05-05 DIAGNOSIS — J449 Chronic obstructive pulmonary disease, unspecified: Secondary | ICD-10-CM | POA: Diagnosis not present

## 2017-05-05 DIAGNOSIS — Z800 Family history of malignant neoplasm of digestive organs: Secondary | ICD-10-CM | POA: Diagnosis not present

## 2017-05-05 DIAGNOSIS — F17210 Nicotine dependence, cigarettes, uncomplicated: Secondary | ICD-10-CM | POA: Diagnosis not present

## 2017-05-05 DIAGNOSIS — Z833 Family history of diabetes mellitus: Secondary | ICD-10-CM | POA: Diagnosis not present

## 2017-05-05 DIAGNOSIS — Z9889 Other specified postprocedural states: Secondary | ICD-10-CM | POA: Diagnosis not present

## 2017-05-05 DIAGNOSIS — Z801 Family history of malignant neoplasm of trachea, bronchus and lung: Secondary | ICD-10-CM | POA: Diagnosis not present

## 2017-05-05 DIAGNOSIS — E559 Vitamin D deficiency, unspecified: Secondary | ICD-10-CM | POA: Diagnosis not present

## 2017-05-05 DIAGNOSIS — Z79899 Other long term (current) drug therapy: Secondary | ICD-10-CM | POA: Diagnosis not present

## 2017-05-05 DIAGNOSIS — Z811 Family history of alcohol abuse and dependence: Secondary | ICD-10-CM | POA: Diagnosis not present

## 2017-05-05 DIAGNOSIS — Z825 Family history of asthma and other chronic lower respiratory diseases: Secondary | ICD-10-CM | POA: Diagnosis not present

## 2017-05-05 DIAGNOSIS — Z888 Allergy status to other drugs, medicaments and biological substances status: Secondary | ICD-10-CM | POA: Diagnosis not present

## 2017-05-05 DIAGNOSIS — Z818 Family history of other mental and behavioral disorders: Secondary | ICD-10-CM | POA: Diagnosis not present

## 2017-05-05 NOTE — Progress Notes (Signed)
    Daily Group Progress Note 05/05/2017  Program: IOP  Group Time: 9 AM - 10:30 AM  Participation Level: Active  Behavioral Response: Sharing and Intrusive  Type of Therapy:  Process Group  Summary of Progress: Patient's processed thoughts and feelings related to a quote about sharing/not sharing joys and pains. Patient participated significantly in discussion and neede4d some redirection to avoid monopolizing.      Group Time:  10:30 AM - 12 PM  Participation Level:  Active  Behavioral Response: Sharing, Monopolizing and Intrusive  Type of Therapy: Psycho-education Group  Summary of Progress: Brief presentation delivered by facilitator on topic of 'self care' and what that might look like for different individuals. Patients had opportunity to share with others their experience with self care. Patient choose to focus on challenges she is experiencing verses self care.   Sheilah Pigeon, LCSW

## 2017-05-05 NOTE — Progress Notes (Signed)
Psychiatric Initial Adult Assessment   Patient Identification: Erika Williamson MRN:  [id number] Date of Evaluation:  05/05/2017 Referral Source: Dr Daron Offer Chief Complaint:  Depression and anxietyVisit Diagnosis:    ICD-10-CM   1. Chronic post-traumatic stress disorder (PTSD) F43.12   2. MDD (major depressive disorder), recurrent episode, moderate (HCC) F33.1     History of Present Illness:  Ms Miyasaki was in this program in the last year and her issues are similar as to the earlier admission.  Job is the biggest stressor.  She injured her foot in March and has not been able to physically do her job as well.  Co workers have tried to help but just made it harder.  She has remained depressed even after discharge from the last admission but became more so over the last month.  Anger and irritability have increased and she has lost it on her job on a couple of occasions.  Her marriage is interfered with due to the anger and depression.  She feels sad, tries to avoid usual activities, has had thoughts that life is not worth living but no active suicidal thoughts.  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  To stay at home just encourages boredom, isolation and irritability.  She wants to return to IOP to head off problems before they become worse.  Associated Signs/Symptoms: Depression Symptoms:  depressed mood, anhedonia, insomnia, fatigue, difficulty concentrating, suicidal thoughts without plan, anxiety, (Hypo) Manic Symptoms:  Irritable Mood, Anxiety Symptoms:  Excessive Worry, Psychotic Symptoms:  none PTSD Symptoms: not an issue at the moment  Past Psychiatric History: ongoing outpatient treatment with history of inpatient stays  Previous Psychotropic Medications: Yes   Substance Abuse History in the last 12 months:  No.  Consequences of Substance Abuse: Negative  Past Medical History:  Past  Medical History:  Diagnosis Date  . 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 Anxiety   . Atypical face pain    Left Sided  . COPD (chronic obstructive pulmonary disease) ([place])   . Depression   . Foot fracture Left X2  . GERD (gastroesophageal reflux disease)   . HPV (human papilloma virus) infection   . Migraine without aura 04/03/2016  . Moderate recurrent major depression ([place]) 07/22/2015  . Shingles   . Sinusitis   . Tension headache   . Tobacco abuse   . Tonsillitis   . Vitamin D deficiency disease     Past Surgical History:  Procedure Laterality Date  . CESAREAN SECTION     X2  . CHOLECYSTECTOMY     Dr Burt Knack  . COLONOSCOPY WITH PROPOFOL N/A 05/03/2015   Procedure: COLONOSCOPY WITH PROPOFOL;  Surgeon: Josefine Class, MD;  Location: [facility] ENDOSCOPY;  Service: Endoscopy;  Laterality: N/A;  . ESOPHAGOGASTRODUODENOSCOPY N/A 05/03/2015   Procedure: ESOPHAGOGASTRODUODENOSCOPY (EGD);  Surgeon: Josefine Class, MD;  Location: [facility] ENDOSCOPY;  Service: Endoscopy;  Laterality: N/A;  . 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 TONSILLECTOMY    . TUBAL LIGATION    . WISDOM TOOTH EXTRACTION      Family Psychiatric History: depression and substance abuse run in her family  Family History:  Family History  Problem Relation Age of Onset  . Diabetes Mother   . Hypertension Mother   . COPD Mother   . Stroke Mother   .  Osteoporosis Mother   . Depression Mother   . Cancer Father 69       colorectal, spread to lung  . Alcohol abuse Father   . Hypertension Father   . Depression Father   . Diabetes Sister   . COPD Sister   . Anxiety disorder Sister   . Depression Sister   . Fibromyalgia Sister   . Asthma Sister   . ADD / ADHD Son   . ADD / ADHD Daughter     Social History:   Social History   Social History  . Marital status: Married    Spouse name: N/A  . Number of children: N/A  . Years of education: N/A   Social  History Main Topics  . Smoking status: Current Every Day Smoker    Packs/day: 1.00    Years: 22.00    Types: Cigarettes    Start date: 09/03/1993  . Smokeless tobacco: Never Used  . Alcohol use No  . Drug use: No  . Sexual activity: Yes    Partners: Male    Birth control/ protection: IUD   Other Topics Concern  . Not on file   Social History Narrative  . No narrative on file    Additional Social History: none  Allergies:   Allergies  Allergen Reactions  . Gabapentin Anaphylaxis  . Ivp Dye [Iodinated Diagnostic Agents]   . Acyclovir And Related   . Compazine [Prochlorperazine Edisylate]   . Erythromycin   . Levaquin [Levofloxacin In D5w]   . Levofloxacin Other (See Comments)  . Naprosyn [Naproxen]   . Nsaids   . Pristiq [Desvenlafaxine]   . Tramadol Other (See Comments)  . Ultram [Tramadol Hcl]     Metabolic Disorder Labs: No results found for: HGBA1C, MPG No results found for: PROLACTIN No results found for: CHOL, TRIG, HDL, CHOLHDL, VLDL, LDLCALC   Current Medications: Current Outpatient Prescriptions  Medication Sig Dispense Refill  . baclofen (LIORESAL) 10 MG tablet Take 10 mg by mouth 2 (two) times daily.    . halobetasol (ULTRAVATE) 0.05 % cream Apply 0.05 mg topically 2 (two) times daily.    Marland Kitchen lamoTRIgine (LAMICTAL) 200 MG tablet Take 1 tablet (200 mg total) by mouth daily. 90 tablet 1  . levonorgestrel (MIRENA) 20 MCG/24HR IUD 1 each by Intrauterine route once.    . mirtazapine (REMERON) 15 MG tablet Take 1 tablet (15 mg total) by mouth at bedtime. 30 tablet 2  . PARoxetine (PAXIL) 20 MG tablet Take 2 tablets (40 mg total) by mouth daily. 180 tablet 0  . rizatriptan (MAXALT) 10 MG tablet      No current facility-administered medications for this visit.     Neurologic: Headache: Negative Seizure: Negative Paresthesias:Negative  Musculoskeletal: Strength & Muscle Tone: within normal limits Gait & Station: normal Patient leans: N/A  Psychiatric  Specialty Exam: ROS  There were no vitals taken for this visit.There is no height or weight on file to calculate BMI.  General Appearance: Well Groomed  Eye Contact:  Good  Speech:  Clear and Coherent  Volume:  Normal  Mood:  Depressed  Affect:  Depressed  Thought Process:  Coherent and Goal Directed  Orientation:  Full (Time, Place, and Person)  Thought Content:  Logical  Suicidal Thoughts:  Passive thoughts  Homicidal Thoughts:    Memory:  Immediate;   Good Recent;   Good Remote;   Good  Judgement:  Fair  Insight:  Fair  Psychomotor Activity:  Normal  Concentration:  Concentration: Good and Attention Span: Good  Recall:  Good  Fund of Knowledge:Good  Language: Good  Akathisia:  Negative  Handed:  Right  AIMS (if indicated):  0  Assets:  Communication Skills Desire for Improvement Financial Resources/Insurance Housing Intimacy Leisure Time Physical Health Resilience Social Support Talents/Skills Transportation Vocational/Educational  ADL's:  Intact  Cognition: WNL  Sleep:  poor    Treatment Plan Summary: Admit to IOP continue Lamictal 200 mg daily, mirtazepine 15 mg hs (try increasing to 30 mg to see if it helps sleep better).  Still complains of only 3 hrs sleep at night and no medication has helped and reportedly practices sleep hygiene.  Continue paroxetine 40mg  daily   Donnelly Angelica, MD 7/18/20183:01 PM

## 2017-05-06 ENCOUNTER — Other Ambulatory Visit (HOSPITAL_COMMUNITY): Payer: BLUE CROSS/BLUE SHIELD

## 2017-05-07 ENCOUNTER — Other Ambulatory Visit (HOSPITAL_COMMUNITY): Payer: BLUE CROSS/BLUE SHIELD

## 2017-05-10 ENCOUNTER — Other Ambulatory Visit (HOSPITAL_COMMUNITY): Payer: BLUE CROSS/BLUE SHIELD | Admitting: Psychiatry

## 2017-05-10 DIAGNOSIS — F4312 Post-traumatic stress disorder, chronic: Secondary | ICD-10-CM | POA: Diagnosis not present

## 2017-05-10 DIAGNOSIS — F331 Major depressive disorder, recurrent, moderate: Secondary | ICD-10-CM

## 2017-05-10 NOTE — Progress Notes (Signed)
    Daily Group Progress Note  Program: IOP  Group Time: 9:00-12:00  Participation Level: Active  Behavioral Response: Appropriate  Type of Therapy:  Group Therapy  Summary of Progress: Pt. Presents as talkative, engaged in the group process. Pt. Participated in case management and medication management group with the pharmacist. Pt. Discussed with the group that the weekend, especially last night was very difficult for her. Pt. Discussed her fears about losing her marriage and pattern of responding to her husband's emotional neglect with anger. Counselor and other group members provided feedback to encouraged Pt. To express her vulnerability, sadness, and fear to her husband. Pt. Was generally resistant to these suggestions, but acknowledged that she is very comfortable with anger and it is difficult for her to openly express other emotions.       Nancie Neas, LPC

## 2017-05-11 ENCOUNTER — Other Ambulatory Visit (HOSPITAL_COMMUNITY): Payer: BLUE CROSS/BLUE SHIELD

## 2017-05-12 ENCOUNTER — Other Ambulatory Visit (HOSPITAL_COMMUNITY): Payer: BLUE CROSS/BLUE SHIELD | Admitting: Psychiatry

## 2017-05-12 ENCOUNTER — Telehealth (HOSPITAL_COMMUNITY): Payer: Self-pay | Admitting: Psychiatry

## 2017-05-13 ENCOUNTER — Other Ambulatory Visit (HOSPITAL_COMMUNITY): Payer: BLUE CROSS/BLUE SHIELD | Admitting: Psychiatry

## 2017-05-13 DIAGNOSIS — F4312 Post-traumatic stress disorder, chronic: Secondary | ICD-10-CM | POA: Diagnosis not present

## 2017-05-14 ENCOUNTER — Other Ambulatory Visit (HOSPITAL_COMMUNITY): Payer: BLUE CROSS/BLUE SHIELD

## 2017-05-14 NOTE — Progress Notes (Signed)
    Daily Group Progress Note  Program: IOP  Group Time: 9:00-12:00  Participation Level: Active  Behavioral Response: Appropriate  Type of Therapy:  Group Therapy  Summary of Progress: Pt. Presents as talkative, engaged in the group process. Pt. Is focused on her anger that she believes is triggered by her work environment. Pt. Is reluctant to make changes in her life to better manage her anger because she sees the problem as primarily her environment and other people who are not performing her standards. Pt. Discussed her grief experience related to the loss of her father in the attempt to empathize with another patient. Pt. Participated in discussion with the mental health association.    Nancie Neas, LPC

## 2017-05-17 ENCOUNTER — Other Ambulatory Visit (HOSPITAL_COMMUNITY): Payer: BLUE CROSS/BLUE SHIELD

## 2017-05-18 ENCOUNTER — Other Ambulatory Visit (HOSPITAL_COMMUNITY): Payer: BLUE CROSS/BLUE SHIELD | Admitting: Psychiatry

## 2017-05-18 DIAGNOSIS — F4312 Post-traumatic stress disorder, chronic: Secondary | ICD-10-CM | POA: Diagnosis not present

## 2017-05-19 ENCOUNTER — Other Ambulatory Visit (HOSPITAL_COMMUNITY): Payer: BLUE CROSS/BLUE SHIELD

## 2017-05-19 NOTE — Progress Notes (Signed)
    Daily Group Progress Note  Program: IOP  Group Time: 9:00-12:00  Participation Level: Active  Behavioral Response: Appropriate  Type of Therapy:  Group Therapy  Summary of Progress: Pt. Presents as talkative, engaged in the group process. Pt. Reports that she continues to continue primarily with anger toward her husband and co-workers. Pt. 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 Pt. Participated in grief and loss group facilitated by the Chaplain.     Nancie Neas, LPC

## 2017-05-20 ENCOUNTER — Other Ambulatory Visit (HOSPITAL_COMMUNITY): Payer: BLUE CROSS/BLUE SHIELD

## 2017-05-21 ENCOUNTER — Other Ambulatory Visit (HOSPITAL_COMMUNITY): Payer: BLUE CROSS/BLUE SHIELD | Admitting: Psychiatry

## 2017-05-21 ENCOUNTER — Telehealth (HOSPITAL_COMMUNITY): Payer: Self-pay | Admitting: Psychiatry

## 2017-05-22 ENCOUNTER — Emergency Department
Admission: EM | Admit: 2017-05-22 | Discharge: 2017-05-24 | Disposition: A | Discharge status: Home / Self Care | Payer: BLUE CROSS/BLUE SHIELD | Attending: Emergency Medicine | Admitting: Emergency Medicine

## 2017-05-22 DIAGNOSIS — F32A Depression, unspecified: Secondary | ICD-10-CM

## 2017-05-22 DIAGNOSIS — F329 Major depressive disorder, single episode, unspecified: Secondary | ICD-10-CM | POA: Insufficient documentation

## 2017-05-22 DIAGNOSIS — F17210 Nicotine dependence, cigarettes, uncomplicated: Secondary | ICD-10-CM | POA: Diagnosis not present

## 2017-05-22 DIAGNOSIS — Z79899 Other long term (current) drug therapy: Secondary | ICD-10-CM | POA: Insufficient documentation

## 2017-05-22 DIAGNOSIS — J449 Chronic obstructive pulmonary disease, unspecified: Secondary | ICD-10-CM | POA: Insufficient documentation

## 2017-05-22 LAB — COMPREHENSIVE METABOLIC PANEL
ALK PHOS: 73 U/L (ref 38–126)
ALT: 17 U/L (ref 14–54)
ANION GAP: 9 (ref 5–15)
AST: 22 U/L (ref 15–41)
Albumin: 4.6 g/dL (ref 3.5–5.0)
BILIRUBIN TOTAL: 0.5 mg/dL (ref 0.3–1.2)
BUN: 8 mg/dL (ref 6–20)
CALCIUM: 9.3 mg/dL (ref 8.9–10.3)
CO2: 24 mmol/L (ref 22–32)
Chloride: 99 mmol/L — ABNORMAL LOW (ref 101–111)
Creatinine, Ser: 0.66 mg/dL (ref 0.44–1.00)
Glucose, Bld: 98 mg/dL (ref 65–99)
Potassium: 3.9 mmol/L (ref 3.5–5.1)
SODIUM: 132 mmol/L — AB (ref 135–145)
TOTAL PROTEIN: 8.1 g/dL (ref 6.5–8.1)

## 2017-05-22 LAB — CBC WITH DIFFERENTIAL/PLATELET
BASOS PCT: 1 %
Basophils Absolute: 0.1 10*3/uL (ref 0–0.1)
EOS ABS: 0.2 10*3/uL (ref 0–0.7)
Eosinophils Relative: 2 %
HEMATOCRIT: 43.3 % (ref 35.0–47.0)
HEMOGLOBIN: 15.1 g/dL (ref 12.0–16.0)
Lymphocytes Relative: 25 %
Lymphs Abs: 3.1 10*3/uL (ref 1.0–3.6)
MCH: 32.1 pg (ref 26.0–34.0)
MCHC: 35 g/dL (ref 32.0–36.0)
MCV: 91.9 fL (ref 80.0–100.0)
Monocytes Absolute: 0.9 10*3/uL (ref 0.2–0.9)
Monocytes Relative: 7 %
NEUTROS ABS: 8.2 10*3/uL — AB (ref 1.4–6.5)
NEUTROS PCT: 65 %
Platelets: 379 10*3/uL (ref 150–440)
RBC: 4.71 MIL/uL (ref 3.80–5.20)
RDW: 13.3 % (ref 11.5–14.5)
WBC: 12.5 10*3/uL — ABNORMAL HIGH (ref 3.6–11.0)

## 2017-05-22 LAB — URINE DRUG SCREEN, QUALITATIVE (ARMC ONLY)
Amphetamines, Ur Screen: NOT DETECTED
BARBITURATES, UR SCREEN: NOT DETECTED
BENZODIAZEPINE, UR SCRN: NOT DETECTED
CANNABINOID 50 NG, UR ~~LOC~~: NOT DETECTED
Cocaine Metabolite,Ur ~~LOC~~: NOT DETECTED
MDMA (Ecstasy)Ur Screen: NOT DETECTED
METHADONE SCREEN, URINE: NOT DETECTED
Opiate, Ur Screen: NOT DETECTED
Phencyclidine (PCP) Ur S: NOT DETECTED
TRICYCLIC, UR SCREEN: NOT DETECTED

## 2017-05-22 LAB — URINALYSIS, COMPLETE (UACMP) WITH MICROSCOPIC
Bilirubin Urine: NEGATIVE
Glucose, UA: NEGATIVE mg/dL
Ketones, ur: NEGATIVE mg/dL
Leukocytes, UA: NEGATIVE
Nitrite: NEGATIVE
PH: 5 (ref 5.0–8.0)
Protein, ur: NEGATIVE mg/dL
SPECIFIC GRAVITY, URINE: 1.003 — AB (ref 1.005–1.030)

## 2017-05-22 LAB — ETHANOL: Alcohol, Ethyl (B): <5 mg/dL (ref <5)

## 2017-05-22 LAB — POCT PREGNANCY, URINE: PREG TEST UR: NEGATIVE

## 2017-05-22 LAB — ACETAMINOPHEN LEVEL: Acetaminophen (Tylenol), Serum: <10 ug/mL — ABNORMAL LOW (ref 10–30)

## 2017-05-22 LAB — SALICYLATE LEVEL

## 2017-05-22 NOTE — ED Notes (Signed)
Patient brought to room in street clothes. Changed out into behavioral scrubs. Patient informed we need urine spec.

## 2017-05-22 NOTE — ED Provider Notes (Signed)
[facility] Emergency Department Provider Note  ____________________________________________   I have reviewed the triage vital signs and the nursing notes.   HISTORY  Chief Complaint Depression   History limited by: Depression, crying   HPI Erika Williamson is a 41 y.o. female who presents to the emergency department today because of concern for depression and thoughts about not wanting to live anymore. The patient states that she feels poorly because her husband no longer loves her. States that he told her she was never good enough for him. The patient is involved with an outpatient program at [facility] for her depression. Patient states that the next time she would see her psychiatrist would be in a couple of weeks. The patient is quite tearful during exam and does not answer all questions.   Past Medical History:  Diagnosis Date  . 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 Anxiety   . Atypical face pain    Left Sided  . COPD (chronic obstructive pulmonary disease) ([place])   . Depression   . Foot fracture Left X2  . GERD (gastroesophageal reflux disease)   . HPV (human papilloma virus) infection   . Migraine without aura 04/03/2016  . Moderate recurrent major depression ([place]) 07/22/2015  . Shingles   . Sinusitis   . Tension headache   . Tobacco abuse   . Tonsillitis   . Vitamin D deficiency disease     Patient Active Problem List   Diagnosis Date Noted  . Cat bite of forearm 06/17/2016  . Chronic obstructive pulmonary disease ([place]) 05/04/2016  . Contact dermatitis due to poison oak 04/03/2016  . Migraine without aura 04/03/2016  . NSAIDs adverse reaction 09/09/2015  . MDD (major depressive disorder), recurrent episode, moderate ([place]) 07/22/2015  . Gluten intolerance 06/19/2015  . Tobacco abuse   . Vitamin D deficiency disease   . HPV (human papilloma virus) infection   . High grade  squamous intraepithelial cervical dysplasia   . GERD (gastroesophageal reflux disease)     Past Surgical History:  Procedure Laterality Date  . CESAREAN SECTION     X2  . CHOLECYSTECTOMY     Dr Burt Knack  . COLONOSCOPY WITH PROPOFOL N/A 05/03/2015   Procedure: COLONOSCOPY WITH PROPOFOL;  Surgeon: Josefine Class, MD;  Location: [facility] ENDOSCOPY;  Service: Endoscopy;  Laterality: N/A;  . ESOPHAGOGASTRODUODENOSCOPY N/A 05/03/2015   Procedure: ESOPHAGOGASTRODUODENOSCOPY (EGD);  Surgeon: Josefine Class, MD;  Location: [facility] ENDOSCOPY;  Service: Endoscopy;  Laterality: N/A;  . 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 TONSILLECTOMY    . TUBAL LIGATION    . WISDOM TOOTH EXTRACTION      Prior to Admission medications   Medication Sig Start Date End Date Taking? Authorizing Provider  baclofen (LIORESAL) 10 MG tablet Take 10 mg by mouth 2 (two) times daily.    [provider]  halobetasol (ULTRAVATE) 0.05 % cream Apply 0.05 mg topically 2 (two) times daily.    [provider]  lamoTRIgine (LAMICTAL) 200 MG tablet Take 1 tablet (200 mg total) by mouth daily. 04/23/17 04/23/18  Aundra Dubin, MD  levonorgestrel (MIRENA) 20 MCG/24HR IUD 1 each by Intrauterine route once.    [provider]  mirtazapine (REMERON) 15 MG tablet Take 1 tablet (15 mg total) by mouth at bedtime. 04/23/17 04/23/18  Aundra Dubin, MD  PARoxetine (PAXIL) 20 MG tablet Take 2 tablets (40 mg  total) by mouth daily. 03/01/17 03/01/18  Aundra Dubin, MD  rizatriptan (MAXALT) 10 MG tablet  03/14/15   [provider]    Allergies Gabapentin; Ivp dye [iodinated diagnostic agents]; Acyclovir and related; Compazine [prochlorperazine edisylate]; Erythromycin; Levaquin [levofloxacin in d5w]; Levofloxacin; Naprosyn [naproxen]; Nsaids; Pristiq [desvenlafaxine]; Tramadol; and Ultram [tramadol hcl]  Family History  Problem Relation Age of Onset  . Diabetes Mother   . Hypertension Mother   .  COPD Mother   . Stroke Mother   . Osteoporosis Mother   . Depression Mother   . Cancer Father 72       colorectal, spread to lung  . Alcohol abuse Father   . Hypertension Father   . Depression Father   . Diabetes Sister   . COPD Sister   . Anxiety disorder Sister   . Depression Sister   . Fibromyalgia Sister   . Asthma Sister   . ADD / ADHD Son   . ADD / ADHD Daughter     Social History Social History  Substance Use Topics  . Smoking status: Current Every Day Smoker    Packs/day: 1.00    Years: 22.00    Types: Cigarettes    Start date: 09/03/1993  . Smokeless tobacco: Never Used  . Alcohol use No    Review of Systems Constitutional: No fever/chills Eyes: No visual changes. ENT: No sore throat. Cardiovascular: Denies chest pain. Respiratory: Denies shortness of breath. Gastrointestinal: No abdominal pain.  No nausea, no vomiting.  No diarrhea.   Genitourinary: Negative for dysuria. Musculoskeletal: Negative for back pain. Skin: Negative for rash. Neurological: Negative for headaches, focal weakness or numbness. Psychiatric: Positive for depression. ____________________________________________   PHYSICAL EXAM:  VITAL SIGNS: ED Triage Vitals [05/22/17 1619]  Enc Vitals Group     BP 123/82     Pulse 79     Resp 18     Temp 98.2     Temp src      SpO2 97     Weight 175 lb (79.4 kg)     Height 5' 3.5" (1.613 m)   Constitutional: Awake and alert. Tearful, upset.  Eyes: Conjunctivae are normal.  ENT   Head: Normocephalic and atraumatic.   Nose: No congestion/rhinnorhea.   Mouth/Throat: Mucous membranes are moist.   Neck: No stridor. Hematological/Lymphatic/Immunilogical: No cervical lymphadenopathy. Cardiovascular: Normal rate, regular rhythm.  No murmurs, rubs, or gallops.  Respiratory: Normal respiratory effort without tachypnea nor retractions. Breath sounds are clear and equal bilaterally. No wheezes/rales/rhonchi. Gastrointestinal: Soft  and non tender. No rebound. No guarding.  Genitourinary: Deferred Musculoskeletal: Normal range of motion in all extremities. No lower extremity edema. Neurologic:  Normal speech and language. No gross focal neurologic deficits are appreciated.  Skin:  Skin is warm, dry and intact. No rash noted. Psychiatric: Tearful, upset. Depressed.   ____________________________________________    LABS (pertinent positives/negatives)  Labs Reviewed  COMPREHENSIVE METABOLIC PANEL - Abnormal; Notable for the following:       Result Value   Sodium 132 (*)    Chloride 99 (*)    All other components within normal limits  CBC WITH DIFFERENTIAL/PLATELET - Abnormal; Notable for the following:    WBC 12.5 (*)    Neutro Abs 8.2 (*)    All other components within normal limits  ACETAMINOPHEN LEVEL - Abnormal; Notable for the following:    Acetaminophen (Tylenol), Serum <10 (*)    All other components within normal limits  URINALYSIS, COMPLETE (UACMP) WITH MICROSCOPIC -  Abnormal; Notable for the following:    Color, Urine STRAW (*)    APPearance CLEAR (*)    Specific Gravity, Urine 1.003 (*)    Hgb urine dipstick MODERATE (*)    Bacteria, UA RARE (*)    Squamous Epithelial / LPF 0-5 (*)    All other components within normal limits  ETHANOL  URINE DRUG SCREEN, QUALITATIVE ([facility] ONLY)  SALICYLATE LEVEL  POC URINE PREG, ED  POCT PREGNANCY, URINE     ____________________________________________   EKG  None  ____________________________________________    RADIOLOGY  None  ____________________________________________   PROCEDURES  Procedures  ____________________________________________   INITIAL IMPRESSION / ASSESSMENT AND PLAN / ED COURSE  Pertinent labs & imaging results that were available during my care of the patient were reviewed by me and considered in my medical decision making (see chart for details).  Patient presents to the emergency department today with severe  depression. Patient is tearful. Patient states she wants to appear she was gone by specialist on call recommends inpatient admission. Given patient's history of depression and suicidal ideation will place under IVC  ____________________________________________   FINAL CLINICAL IMPRESSION(S) / ED DIAGNOSES  Final diagnoses:  Depression, unspecified depression type     Note: This dictation was prepared with Dragon dictation. Any transcriptional errors that result from this process are unintentional     Nance Pear, MD 05/22/17 1929

## 2017-05-22 NOTE — BHH Counselor (Signed)
Writer spoke with Erika Seal, RN to complete [facility] recommended inpatient treatment for Patient.  Writer informed confirmed cancellation of TTS consult, per Attending Provider, Archie Balboa, MD.  RN reported that TTS consult would be entered, per recommendation, on 05/23/2017.  Leroy Sea, [place] Therapeutic Triage Specialist [phone]

## 2017-05-22 NOTE — ED Notes (Signed)
Report called to Irine Seal, RN in ED B[facility].

## 2017-05-22 NOTE — ED Notes (Signed)
BEHAVIORAL HEALTH ROUNDING Patient sleeping: No. Patient alert and oriented: yes Behavior appropriate: Yes.  ; If no, describe:  Nutrition and fluids offered: Yes  Toileting and hygiene offered: Yes  Sitter present: not applicable Law enforcement present: Yes  

## 2017-05-22 NOTE — ED Notes (Signed)

## 2017-05-22 NOTE — ED Triage Notes (Signed)
Pt crying uncontrollably in triage and not really answering questions. Came into ed stating she "is in crisis"

## 2017-05-22 NOTE — ED Notes (Signed)

## 2017-05-23 MED ORDER — MIRTAZAPINE 15 MG PO TABS
15.0000 mg | ORAL_TABLET | Freq: Every day | ORAL | Status: DC
  Filled 2017-05-23: qty 1

## 2017-05-23 MED ORDER — BACLOFEN 10 MG PO TABS
10.0000 mg | ORAL_TABLET | Freq: Two times a day (BID) | ORAL | Status: DC
  Administered 2017-05-23 – 2017-05-24 (×3): 10 mg via ORAL
  Filled 2017-05-23 (×3): qty 1

## 2017-05-23 MED ORDER — LORAZEPAM 0.5 MG PO TABS: 0.5000 mg | ORAL_TABLET | Freq: Three times a day (TID) | ORAL | Status: DC | PRN

## 2017-05-23 MED ORDER — PAROXETINE HCL 20 MG PO TABS
40.0000 mg | ORAL_TABLET | Freq: Every day | ORAL | Status: DC
  Administered 2017-05-23 – 2017-05-24 (×2): 40 mg via ORAL
  Filled 2017-05-23 (×2): qty 2

## 2017-05-23 MED ORDER — LAMOTRIGINE 100 MG PO TABS
200.0000 mg | ORAL_TABLET | Freq: Every day | ORAL | Status: DC
  Administered 2017-05-23 – 2017-05-24 (×2): 200 mg via ORAL
  Filled 2017-05-23 (×2): qty 2

## 2017-05-23 NOTE — ED Provider Notes (Signed)
-----------------------------------------   12:09 PM on 05/23/2017 -----------------------------------------  Patient was medically cleared prior to my arrival I amwriting for home medications which have beenverified through pharmacy. Apparently she is on baclofen, so, to avoid withdrawal, we will continue that medication as well as other home medications as they are presented to [place].   Schuyler Amor, MD 05/23/17 1210

## 2017-05-23 NOTE — ED Notes (Signed)
[facility] called and patient medication verified with with Ms. Laveda Abbe in the pharmacy. Dr. Burlene Arnt notified and medication reorder. Will cont to monitor pt.

## 2017-05-23 NOTE — ED Notes (Signed)
PT IS IVC AND PENDING PLACEMENT

## 2017-05-23 NOTE — ED Notes (Signed)
Pt has been up and about through milieu. Pt has been very teary eye. Pt denies si/hi/vh/and hi at this time. Pt appears very sad and depressed but denies depression. Pt denies every saying she wanted to harm herself. Pt is very upset that she has been IVC'd. Will cont to monitor pt.

## 2017-05-23 NOTE — ED Notes (Signed)
Patient asking to leave stating, "I do not want to kill myself.  If I wanted to kill myself I would have done it instead of coming here."  Writer informed patient that she is IVC and inpatient admission is recommended.
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
## 2017-05-23 NOTE — ED Notes (Signed)
Pt is currently showering. No complaints at this time. Will cont to monitor pt.

## 2017-05-23 NOTE — ED Notes (Signed)
Referral information for Psychiatric Hospitalization faxed to;    [facility] [phone]),    [facility] [phone] or [phone]    [facility] [phone]),    [address] [phone]),    [place] [phone]),    [facility] [phone]),    [facility] [phone]).

## 2017-05-23 NOTE — ED Provider Notes (Signed)
-----------------------------------------   6:19 AM on 05/23/2017 -----------------------------------------   Blood pressure 123/82, pulse 79, temperature 98.2 F (36.8 C), temperature source Oral, resp. rate 18, height 5' 3.5" (1.613 m), weight 79.4 kg (175 lb), last menstrual period 05/17/2017, SpO2 97 %.  The patient had no acute events since last update.  Calm and cooperative at this time.  Disposition is pending Psychiatry/Behavioral Medicine team recommendations.     Darel Hong, MD 05/23/17 [phone]

## 2017-05-23 NOTE — ED Notes (Signed)
Pt mother visit. Visit was pleasant and without incident. Will cont to monitor pt.

## 2017-05-24 ENCOUNTER — Other Ambulatory Visit (HOSPITAL_COMMUNITY): Payer: BLUE CROSS/BLUE SHIELD

## 2017-05-24 NOTE — ED Provider Notes (Signed)
-----------------------------------------   10:33 AM on 05/24/2017 -----------------------------------------  The patient has been seen and evaluated by psychiatry. They believe the patient is safe for discharge home from a psychiatric standpoint. Patient's medical workup has been largely nonrevealing. We will discharge the patient from the emergency department at this time with outpatient follow-up as the patient already has.   Harvest Dark, MD 05/24/17 1034

## 2017-05-24 NOTE — ED Notes (Signed)
Nurse talked to patient and she denies Si/hi or avh, she is alert and oriented, no signs of distress at this time, Patient states that she was just feeling emotional about her husband and came in to talk to someone and she had no idea that she would be stuck here, and that she knows this is a wake up call and would like to go home and just continue her out-patient treatment for anxiety, and wants to work on her marriage. Patient is safe, q 15 minute checks and camera surveillance in progress.

## 2017-05-24 NOTE — Discharge Instructions (Signed)
You have been seen in the emergency department for a  psychiatric concern. You have been evaluated both medically as well as psychiatrically. Please follow-up with your outpatient resources provided. Return to the emergency department for any worsening symptoms, or any thoughts of hurting yourself or anyone else so that we may attempt to help you. 

## 2017-05-24 NOTE — ED Notes (Signed)

## 2017-05-24 NOTE — BH Assessment (Signed)
Writer called [facility] ([phone]) and informed them the patient will not be admitted to their facility, patient is discharging.

## 2017-05-24 NOTE — ED Notes (Signed)
Patient ate 100% of breakfast and beverage.  

## 2017-05-24 NOTE — ED Provider Notes (Signed)
-----------------------------------------   7:26 AM on 05/24/2017 -----------------------------------------   Blood pressure 128/75, pulse 66, temperature 97.9 F (36.6 C), temperature source Oral, resp. rate 18, height 5' 3.5" (1.613 m), weight 79.4 kg (175 lb), last menstrual period 05/17/2017, SpO2 99 %.  The patient had no acute events since last update.  Calm and cooperative at this time.  The patient was accepted to [facility].     Loney Hering, MD 05/24/17 [phone]

## 2017-05-24 NOTE — ED Notes (Signed)
Dr. Weber Cooks talking to patient. Patient is calm and cooperative.

## 2017-05-24 NOTE — ED Notes (Signed)
Patient voices understanding of discharge instructions. All belongings returned to patient/family.  Patient family will pick her up and transport her home. Patient without any signs of distress noted.

## 2017-05-24 NOTE — ED Notes (Signed)
Patient has been accepted to [facility].  Patient assigned to [place] Accepting physician is Dr. Reece Levy.  Call report to [phone].  Representative was [employer].  ER Staff is aware of it ( [facility] Sect.; Dr. Dahlia Client, ER MD & Joycelyn Schmid Patient's Nurse)     Patient can arrive after 9:00 a.m.  [place]. Erika Williamson, Erika Williamson [phone]

## 2017-05-25 ENCOUNTER — Other Ambulatory Visit (HOSPITAL_COMMUNITY): Payer: BLUE CROSS/BLUE SHIELD | Attending: Psychiatry | Admitting: Psychiatry

## 2017-05-25 DIAGNOSIS — F331 Major depressive disorder, recurrent, moderate: Secondary | ICD-10-CM | POA: Diagnosis not present

## 2017-05-25 DIAGNOSIS — F4312 Post-traumatic stress disorder, chronic: Secondary | ICD-10-CM
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
## 2017-05-26 ENCOUNTER — Other Ambulatory Visit (HOSPITAL_COMMUNITY): Payer: BLUE CROSS/BLUE SHIELD | Admitting: Psychiatry

## 2017-05-26 DIAGNOSIS — F331 Major depressive disorder, recurrent, moderate: Secondary | ICD-10-CM

## 2017-05-27 ENCOUNTER — Telehealth (HOSPITAL_COMMUNITY): Payer: Self-pay | Admitting: Psychiatry

## 2017-05-27 ENCOUNTER — Other Ambulatory Visit (HOSPITAL_COMMUNITY): Payer: BLUE CROSS/BLUE SHIELD

## 2017-05-27 NOTE — Progress Notes (Signed)
    Daily Group Progress Note  Program: IOP  Group Time: 9:00-12:00  Participation Level: Active  Behavioral Response: Appropriate  Type of Therapy:  Group Therapy  Summary of Progress: Pt. Was lethargic, slept through group. Pt. Was responsive when counselor attempted to wake her. Pt. Stated that she started taking new medication in the morning and it had sedative effect. Pt. Participated in grief and loss group with the Chaplain.     Nancie Neas, LPC

## 2017-05-28 ENCOUNTER — Other Ambulatory Visit (HOSPITAL_COMMUNITY): Payer: BLUE CROSS/BLUE SHIELD | Admitting: Psychiatry

## 2017-05-28 ENCOUNTER — Telehealth (HOSPITAL_COMMUNITY): Payer: Self-pay | Admitting: Psychiatry

## 2017-05-31 ENCOUNTER — Telehealth (HOSPITAL_COMMUNITY): Payer: Self-pay | Admitting: Psychiatry

## 2017-05-31 ENCOUNTER — Other Ambulatory Visit (HOSPITAL_COMMUNITY): Payer: BLUE CROSS/BLUE SHIELD | Admitting: Psychiatry

## 2017-06-01 ENCOUNTER — Other Ambulatory Visit (HOSPITAL_COMMUNITY): Payer: BLUE CROSS/BLUE SHIELD | Admitting: Psychiatry

## 2017-06-01 ENCOUNTER — Telehealth (HOSPITAL_COMMUNITY): Payer: Self-pay | Admitting: Psychiatry

## 2017-06-01 DIAGNOSIS — F331 Major depressive disorder, recurrent, moderate: Secondary | ICD-10-CM | POA: Diagnosis not present

## 2017-06-01 NOTE — Progress Notes (Signed)
    Daily Group Progress Note  Program: IOP  Group Time: 9:00-12:00  Participation Level: Active  Behavioral Response: Appropriate  Type of Therapy:  Group Therapy  Summary of Progress: Pt. Presents as talkative, engaged in the group process. Pt. Discussed that she feels fearful about going back to work and that she is not ready. Pt. Received feedback from the group about not going back to her workplace for non-essential reasons such as to pick up her medication or dog food. Pt. Continues to give feedback to other group members. Pt. Participated in discussion about understanding intentional and unintentional cognitive models. Pt. Participated in grief and loss group with the chaplain.      Nancie Neas, LPC

## 2017-06-01 NOTE — Telephone Encounter (Signed)
D:  Pt states that she isn't ready to RTW on 06-07-17.  Pt is requesting to continue in [place].  According to pt, she is still struggling with depressive symptoms (ie. Irritability, poor sleep, crying spells, indecisiveness, anhedonia, no motivation, poor appetite, ruminating thoughts, anxiety).  Pt has an outpt appt with Dr. Daron Offer on 06-04-17 in the clinic.  A:  D/C from [facility] on 06-11-17.  RTW on 06-22-17; without any restrictions.  Placed call to [facility] to request an extension.  Elberta Fortis (rep) will be faxing extension forms to Probation officer today.  R:  Pt receptive.   Dellia Nims, M.Ed, CNA

## 2017-06-02 ENCOUNTER — Other Ambulatory Visit (HOSPITAL_COMMUNITY): Payer: BLUE CROSS/BLUE SHIELD

## 2017-06-03 ENCOUNTER — Other Ambulatory Visit (HOSPITAL_COMMUNITY): Payer: BLUE CROSS/BLUE SHIELD

## 2017-06-04 ENCOUNTER — Ambulatory Visit (INDEPENDENT_AMBULATORY_CARE_PROVIDER_SITE_OTHER): Payer: BLUE CROSS/BLUE SHIELD | Admitting: Psychiatry

## 2017-06-04 DIAGNOSIS — F4312 Post-traumatic stress disorder, chronic: Secondary | ICD-10-CM

## 2017-06-04 DIAGNOSIS — F17210 Nicotine dependence, cigarettes, uncomplicated: Secondary | ICD-10-CM

## 2017-06-04 DIAGNOSIS — Z811 Family history of alcohol abuse and dependence: Secondary | ICD-10-CM

## 2017-06-04 DIAGNOSIS — F603 Borderline personality disorder: Secondary | ICD-10-CM | POA: Diagnosis not present

## 2017-06-04 DIAGNOSIS — Z818 Family history of other mental and behavioral disorders: Secondary | ICD-10-CM

## 2017-06-04 DIAGNOSIS — G4700 Insomnia, unspecified: Secondary | ICD-10-CM

## 2017-06-04 MED ORDER — QUETIAPINE FUMARATE 50 MG PO TABS: 50.0000 mg | ORAL_TABLET | Freq: Every day | ORAL | 1 refills | Status: DC

## 2017-06-04 MED ORDER — LAMOTRIGINE 200 MG PO TABS: 200.0000 mg | ORAL_TABLET | Freq: Every day | ORAL | 1 refills | Status: DC

## 2017-06-04 MED ORDER — PAROXETINE HCL 40 MG PO TABS: 40.0000 mg | ORAL_TABLET | Freq: Every day | ORAL | 1 refills | Status: DC
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
## 2017-06-07 ENCOUNTER — Other Ambulatory Visit (HOSPITAL_COMMUNITY): Payer: BLUE CROSS/BLUE SHIELD | Admitting: Psychiatry

## 2017-06-07 DIAGNOSIS — F331 Major depressive disorder, recurrent, moderate: Secondary | ICD-10-CM | POA: Diagnosis not present

## 2017-06-07 DIAGNOSIS — F4312 Post-traumatic stress disorder, chronic: Secondary | ICD-10-CM

## 2017-06-08 ENCOUNTER — Other Ambulatory Visit (HOSPITAL_COMMUNITY): Payer: BLUE CROSS/BLUE SHIELD | Admitting: Psychiatry

## 2017-06-08 DIAGNOSIS — F331 Major depressive disorder, recurrent, moderate: Secondary | ICD-10-CM
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
## 2017-06-08 NOTE — Patient Instructions (Signed)
D:  Pt requesting discharge today d/t losing position at her job.  Pt is requesting to return to job tomorrow without any restrictions.  A: D/C today.  F/U with Dr. Daron Offer and Valora Piccolo, [facility].  Encouraged support groups along with DBT.  R:  Pt receptive.
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
## 2017-06-09 ENCOUNTER — Encounter: Payer: Self-pay | Admitting: Obstetrics and Gynecology

## 2017-06-09 ENCOUNTER — Ambulatory Visit (INDEPENDENT_AMBULATORY_CARE_PROVIDER_SITE_OTHER): Payer: BLUE CROSS/BLUE SHIELD | Admitting: Obstetrics and Gynecology

## 2017-06-09 ENCOUNTER — Other Ambulatory Visit (HOSPITAL_COMMUNITY): Payer: BLUE CROSS/BLUE SHIELD | Admitting: Psychiatry

## 2017-06-09 VITALS — BP 141/78 | HR 92 | Ht 63.5 in | Wt 181.8 lb

## 2017-06-09 DIAGNOSIS — Z30432 Encounter for removal of intrauterine contraceptive device: Secondary | ICD-10-CM | POA: Diagnosis not present

## 2017-06-09 DIAGNOSIS — F331 Major depressive disorder, recurrent, moderate: Secondary | ICD-10-CM

## 2017-06-09 DIAGNOSIS — N938 Other specified abnormal uterine and vaginal bleeding: Secondary | ICD-10-CM

## 2017-06-09 HISTORY — PX: OTHER SURGICAL HISTORY: SHX169

## 2017-06-09 NOTE — Progress Notes (Deleted)
    GYNECOLOGY PROGRESS NOTE  Subjective:    Patient ID: Erika Williamson, female    DOB: Nov 07, 1975, 41 y.o.   MRN: [id number]  HPI  Patient is a 41 y.o. G55P2000 female who presents for   {Common ambulatory SmartLinks:19316}  Review of Systems {ros; complete:30496}   Objective:   Blood pressure (!) 141/78, pulse 92, height 5' 3.5" (1.613 m), weight 181 lb 12.8 oz (82.5 kg), last menstrual period 05/17/2017. General appearance: {general exam:16600} Abdomen: {abdominal exam:16834} Pelvic: {pelvic exam:16852::"cervix normal in appearance","external genitalia normal","no adnexal masses or tenderness","no cervical motion tenderness","rectovaginal septum normal","uterus normal size, shape, and consistency","vagina normal without discharge"} Extremities: {extremity exam:5109} Neurologic: {neuro exam:17854}   Assessment:    Plan:

## 2017-06-09 NOTE — Progress Notes (Signed)
    GYNECOLOGY OFFICE PROCEDURE NOTE  Erika Williamson is a 41 y.o. G2P2000 here for Mirena IUD removal. No GYN concerns.  Patient thinks that it is contributing to her recurring major depression episodes, as she has noticed a worsening in her symptoms since it was inserted last year.  Reports recent hospitalization several weeks ago for an episode of depression. She is also still noting that her cycles are irregularly occurring (q 2-4 weeks), with light to moderate bleeding that lasts from 4-10 days.  Last pap smear was on 11/07/2015 and was normal.  IUD Removal  Patient identified, informed consent performed, consent signed.  Patient was in the dorsal lithotomy position, normal external genitalia was noted.  A speculum was placed in the patient's vagina, normal discharge was noted, no lesions. The cervix was visualized, no lesions, no abnormal discharge.  The strings of the IUD were grasped and pulled using ring forceps. The IUD was removed in its entirety.  Patient tolerated the procedure well.    Patient has a history of tubal ligation for contraception.  Routine preventative health maintenance measures emphasized.   Rubie Maid, MD [facility]

## 2017-06-09 NOTE — Progress Notes (Signed)
Patient ID: Erika Williamson, female   DOB: 09-23-1976, 41 y.o.   MRN: [id number]  [facility] IOP DISCHARGE NOTE  Patient:  Erika Williamson DOB:  04-05-1976  Date of Admission: 05/03/2017  Date of Discharge: 06/08/2017  Reason for Admission:depression  IOP Course:somewhat erratic attendance but overall attended and participated.  No great changes in that she said she needed more time in the group but it seemed she had gotten as much benefit as we could expect.  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

## 2017-06-10 ENCOUNTER — Other Ambulatory Visit (HOSPITAL_COMMUNITY): Payer: BLUE CROSS/BLUE SHIELD

## 2017-06-10 NOTE — Progress Notes (Signed)
    Daily Group Progress Note  Program: IOP  Group Time: 9:00-12:00   Participation Level: Active   Behavioral Response:  Appropriate   Type of Therapy:  Group Therapy   Summary of Progress:  Pt presented as engaged and talkative.  Pt provided feedback and advice to other group members and seemed to want to get.  Pt. Watched [employer] video about vulnerability and participated in discussion about the use of vulnerability in the treatment of depression.  Nancie Neas, LPC

## 2017-06-11 ENCOUNTER — Other Ambulatory Visit (HOSPITAL_COMMUNITY): Payer: BLUE CROSS/BLUE SHIELD

## 2017-06-14 ENCOUNTER — Other Ambulatory Visit (HOSPITAL_COMMUNITY): Payer: BLUE CROSS/BLUE SHIELD

## 2017-06-15 ENCOUNTER — Other Ambulatory Visit (HOSPITAL_COMMUNITY): Payer: BLUE CROSS/BLUE SHIELD

## 2017-06-16 ENCOUNTER — Other Ambulatory Visit (HOSPITAL_COMMUNITY): Payer: BLUE CROSS/BLUE SHIELD

## 2017-06-17 ENCOUNTER — Other Ambulatory Visit (HOSPITAL_COMMUNITY): Payer: BLUE CROSS/BLUE SHIELD

## 2017-06-18 ENCOUNTER — Other Ambulatory Visit (HOSPITAL_COMMUNITY): Payer: BLUE CROSS/BLUE SHIELD

## 2017-06-22 ENCOUNTER — Other Ambulatory Visit (HOSPITAL_COMMUNITY): Payer: BLUE CROSS/BLUE SHIELD

## 2017-06-23 ENCOUNTER — Other Ambulatory Visit (HOSPITAL_COMMUNITY): Payer: BLUE CROSS/BLUE SHIELD

## 2017-06-24 ENCOUNTER — Other Ambulatory Visit (HOSPITAL_COMMUNITY): Payer: BLUE CROSS/BLUE SHIELD

## 2017-06-25 ENCOUNTER — Other Ambulatory Visit (HOSPITAL_COMMUNITY): Payer: BLUE CROSS/BLUE SHIELD

## 2017-07-05 ENCOUNTER — Ambulatory Visit (HOSPITAL_COMMUNITY): Payer: Self-pay | Admitting: Psychiatry

## 2017-07-19 ENCOUNTER — Encounter: Payer: Self-pay | Admitting: Emergency Medicine

## 2017-07-19 ENCOUNTER — Emergency Department
Admission: EM | Admit: 2017-07-19 | Discharge: 2017-07-19 | Disposition: A | Discharge status: Home / Self Care | Payer: BLUE CROSS/BLUE SHIELD | Attending: Emergency Medicine | Admitting: Emergency Medicine

## 2017-07-19 DIAGNOSIS — L299 Pruritus, unspecified: Secondary | ICD-10-CM | POA: Diagnosis present

## 2017-07-19 DIAGNOSIS — J449 Chronic obstructive pulmonary disease, unspecified: Secondary | ICD-10-CM | POA: Diagnosis not present

## 2017-07-19 DIAGNOSIS — T7840XA Allergy, unspecified, initial encounter: Secondary | ICD-10-CM | POA: Diagnosis not present

## 2017-07-19 DIAGNOSIS — F329 Major depressive disorder, single episode, unspecified: Secondary | ICD-10-CM | POA: Insufficient documentation

## 2017-07-19 DIAGNOSIS — Z79899 Other long term (current) drug therapy: Secondary | ICD-10-CM | POA: Diagnosis not present

## 2017-07-19 DIAGNOSIS — F419 Anxiety disorder, unspecified: Secondary | ICD-10-CM | POA: Diagnosis not present

## 2017-07-19 DIAGNOSIS — Z9049 Acquired absence of other specified parts of digestive tract: Secondary | ICD-10-CM | POA: Diagnosis not present

## 2017-07-19 DIAGNOSIS — F17210 Nicotine dependence, cigarettes, uncomplicated: Secondary | ICD-10-CM | POA: Diagnosis not present

## 2017-07-19 MED ORDER — SODIUM CHLORIDE 0.9 % IV BOLUS (SEPSIS)
1000.0000 mL | Freq: Once | INTRAVENOUS | Status: AC
  Administered 2017-07-19: 1000 mL via INTRAVENOUS

## 2017-07-19 MED ORDER — FAMOTIDINE 20 MG PO TABS: 20.0000 mg | ORAL_TABLET | Freq: Every day | ORAL | 0 refills | Status: DC

## 2017-07-19 MED ORDER — METHYLPREDNISOLONE SODIUM SUCC 125 MG IJ SOLR
125.0000 mg | Freq: Once | INTRAMUSCULAR | Status: AC
  Administered 2017-07-19: 125 mg via INTRAVENOUS
  Filled 2017-07-19: qty 2

## 2017-07-19 MED ORDER — DIPHENHYDRAMINE HCL 50 MG/ML IJ SOLN
50.0000 mg | Freq: Once | INTRAMUSCULAR | Status: AC
  Administered 2017-07-19: 50 mg via INTRAVENOUS
  Filled 2017-07-19: qty 1

## 2017-07-19 MED ORDER — EPINEPHRINE 0.3 MG/0.3ML IJ SOAJ: 0.3000 mg | Freq: Once | INTRAMUSCULAR | 1 refills | Status: AC

## 2017-07-19 MED ORDER — FAMOTIDINE IN NACL 20-0.9 MG/50ML-% IV SOLN
20.0000 mg | Freq: Once | INTRAVENOUS | Status: AC
  Administered 2017-07-19: 20 mg via INTRAVENOUS
  Filled 2017-07-19: qty 50

## 2017-07-19 MED ORDER — PREDNISONE 20 MG PO TABS
60.0000 mg | ORAL_TABLET | Freq: Every day | ORAL | 0 refills | Status: DC
Start: 2017-07-19 — End: 2017-07-23

## 2017-07-19 NOTE — ED Notes (Signed)
Pt verbalizes d/c understanding of teaching and follow up. Pt verbalizes RX given. Pt in NAD at time of d/c. Pt ambulatory. Pt unable to sign due to computer malfnx

## 2017-07-19 NOTE — ED Notes (Signed)
Itching and redness decreased, cont no resp distress.

## 2017-07-19 NOTE — Discharge Instructions (Signed)
Stop Seroquel.

## 2017-07-19 NOTE — ED Provider Notes (Signed)
[facility] Emergency Department Provider Note  ____________________________________________  Time seen: Approximately 9:05 AM  I have reviewed the triage vital signs and the nursing notes.   HISTORY  Chief Complaint Allergic Reaction   HPI Erika Williamson is a 41 y.o. female with h/o several allergies and anaphylaxis who presents for evaluation of allergic reaction. Patient reports she has been on Seroquel for a month. She missed a couple of days and restarted 3 days ago. She reports that every day after taking her Seroquel she breaks out into hivesand it has been getting worse. Last Seroquel was yesterday evening. Patient denies vomiting, diarrhea, difficulty breathing, difficulty swallowing, angioedema, tongue swelling, stridor, wheezing. she has taken 50 mg of by mouth Benadryl 4 hours ago with no significant relief of her symptoms. She is complaining of severe itching has been diffuse worse in her truncal region. No fever or chills.  Past Medical History:  Diagnosis Date  . 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 Anxiety   . Atypical face pain    Left Sided  . COPD (chronic obstructive pulmonary disease) ([place])   . Depression   . Foot fracture Left X2  . GERD (gastroesophageal reflux disease)   . HPV (human papilloma virus) infection   . Migraine without aura 04/03/2016  . Moderate recurrent major depression ([place]) 07/22/2015  . Shingles   . Sinusitis   . Tension headache   . Tobacco abuse   . Tonsillitis   . Vitamin D deficiency disease     Patient Active Problem List   Diagnosis Date Noted  . Cat bite of forearm 06/17/2016  . Chronic obstructive pulmonary disease ([place]) 05/04/2016  . Contact dermatitis due to poison oak 04/03/2016  . Migraine without aura 04/03/2016  . NSAIDs adverse reaction 09/09/2015  . MDD (major depressive disorder), recurrent episode, moderate ([place]) 07/22/2015   . Gluten intolerance 06/19/2015  . Tobacco abuse   . Vitamin D deficiency disease   . HPV (human papilloma virus) infection   . High grade squamous intraepithelial cervical dysplasia   . GERD (gastroesophageal reflux disease)     Past Surgical History:  Procedure Laterality Date  . CESAREAN SECTION     X2  . CHOLECYSTECTOMY     Dr Burt Knack  . COLONOSCOPY WITH PROPOFOL N/A 05/03/2015   Procedure: COLONOSCOPY WITH PROPOFOL;  Surgeon: Josefine Class, MD;  Location: [facility] ENDOSCOPY;  Service: Endoscopy;  Laterality: N/A;  . ESOPHAGOGASTRODUODENOSCOPY N/A 05/03/2015   Procedure: ESOPHAGOGASTRODUODENOSCOPY (EGD);  Surgeon: Josefine Class, MD;  Location: [facility] ENDOSCOPY;  Service: Endoscopy;  Laterality: N/A;  . 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 TONSILLECTOMY    . TUBAL LIGATION    . WISDOM TOOTH EXTRACTION      Prior to Admission medications   Medication Sig Start Date End Date Taking? Authorizing Provider  baclofen (LIORESAL) 10 MG tablet Take 10 mg by mouth 2 (two) times daily.   Yes [provider]  lamoTRIgine (LAMICTAL) 200 MG tablet Take 1 tablet (200 mg total) by mouth daily. 06/04/17 06/04/18 Yes Eksir, Richard Miu, MD  PARoxetine (PAXIL) 40 MG tablet Take 1 tablet (40 mg total) by mouth daily. 06/04/17 06/04/18 Yes Eksir, Richard Miu, MD  EPINEPHrine 0.3 mg/0.3 mL IJ SOAJ injection Inject 0.3 mLs (0.3 mg total) into the muscle once. 07/19/17 07/19/17  Rudene Re, MD  famotidine (PEPCID) 20 MG tablet Take 1 tablet (20  mg total) by mouth daily. 07/19/17 07/23/17  Rudene Re, MD  predniSONE (DELTASONE) 20 MG tablet Take 3 tablets (60 mg total) by mouth daily. 07/19/17 07/23/17  Rudene Re, MD  rizatriptan (MAXALT) 10 MG tablet 10 mg as needed.  03/14/15   [provider]    Allergies Gabapentin; Ivp dye [iodinated diagnostic agents]; Ketorolac; Metrizamide; Nsaids; Other; Tramadol; Acyclovir and related; Erythromycin; Famciclovir;  Levaquin [levofloxacin in d5w]; Levofloxacin; Tolmetin; Tramadol hcl; Ultram [tramadol hcl]; Compazine [prochlorperazine edisylate]; Erythromycin base; Naprosyn [naproxen]; and Pristiq [desvenlafaxine]  Family History  Problem Relation Age of Onset  . Diabetes Mother   . Hypertension Mother   . COPD Mother   . Stroke Mother   . Osteoporosis Mother   . Depression Mother   . Cancer Father 37       colorectal, spread to lung  . Alcohol abuse Father   . Hypertension Father   . Depression Father   . Diabetes Sister   . COPD Sister   . Anxiety disorder Sister   . Depression Sister   . Fibromyalgia Sister   . Asthma Sister   . ADD / ADHD Son   . ADD / ADHD Daughter     Social History Social History  Substance Use Topics  . Smoking status: Current Every Day Smoker    Packs/day: 1.00    Years: 22.00    Types: Cigarettes    Start date: 09/03/1993  . Smokeless tobacco: Never Used  . Alcohol use No    Review of Systems  Constitutional: Negative for fever. Eyes: Negative for visual changes. ENT: Negative for sore throat. Neck: No neck pain  Cardiovascular: Negative for chest pain. Respiratory: Negative for shortness of breath. Gastrointestinal: Negative for abdominal pain, vomiting or diarrhea. Genitourinary: Negative for dysuria. Musculoskeletal: Negative for back pain. Skin: + hives Neurological: Negative for headaches, weakness or numbness. Psych: No SI or HI  ____________________________________________   PHYSICAL EXAM:  VITAL SIGNS: ED Triage Vitals  Enc Vitals Group     BP 07/19/17 0846 116/82     Pulse Rate 07/19/17 0846 (!) 109     Resp 07/19/17 0846 20     Temp 07/19/17 0846 98.6 F (37 C)     Temp Source 07/19/17 0846 Oral     SpO2 07/19/17 0846 96 %     Weight 07/19/17 0847 175 lb (79.4 kg)     Height 07/19/17 0847 5' 3.5" (1.613 m)     Head Circumference --      Peak Flow --      Pain Score --      Pain Loc --      Pain Edu? --      Excl. in  [place]? --     Constitutional: Alert and oriented. Well appearing and in no apparent distress. HEENT:      Head: Normocephalic and atraumatic.         Eyes: Conjunctivae are normal. Sclera is non-icteric.       Mouth/Throat: Mucous membranes are moist. oropharynx is clear, airways patent, no stridor, no intraoral lesions, tongue and uvula are normal size with no swelling, midline.      Neck: Supple with no signs of meningismus. Cardiovascular: Regular rate and rhythm. No murmurs, gallops, or rubs. 2+ symmetrical distal pulses are present in all extremities. No JVD. Respiratory: Normal respiratory effort. Lungs are clear to auscultation bilaterally. No wheezes, crackles, or rhonchi.  Gastrointestinal: Soft, non tender, and non distended with positive bowel sounds. No  rebound or guarding. Musculoskeletal: Nontender with normal range of motion in all extremities. No edema, cyanosis, or erythema of extremities. Neurologic: Normal speech and language. Face is symmetric. Moving all extremities. No gross focal neurologic deficits are appreciated. Skin: Skin is warm, dry and intact. Diffuse hives worse in the truncal region Psychiatric: Mood and affect are normal. Speech and behavior are normal.  ____________________________________________   LABS (all labs ordered are listed, but only abnormal results are displayed)  Labs Reviewed - No data to display ____________________________________________  EKG  none  ____________________________________________  RADIOLOGY  none  ____________________________________________   PROCEDURES  Procedure(s) performed: None Procedures Critical Care performed:  None ____________________________________________   INITIAL IMPRESSION / ASSESSMENT AND PLAN / ED COURSE  41 y.o. female with h/o several allergies and anaphylaxis who presents for evaluation of allergic reaction to possible Seroquel. No evidence of anaphylaxis. Will give benadryl, pepcid,  solumedrol and monitor for signs of anaphylaxis.     _________________________ 11:27 AM on 07/19/2017 -----------------------------------------  patient with full resolution of her symptoms. Continues to have no evidence of anaphylaxis. We'll discharge home with a prescription for an EpiPen which I recommended the patient feels on her way home and carries with her at all times. Discussed indications for an EpiPen administration including calling 911. Patient will be given 4 more days of steroids, Pepcid, and Benadryl.  Pertinent labs & imaging results that were available during my care of the patient were reviewed by me and considered in my medical decision making (see chart for details).    ____________________________________________   FINAL CLINICAL IMPRESSION(S) / ED DIAGNOSES  Final diagnoses:  Allergic reaction, initial encounter      NEW MEDICATIONS STARTED DURING THIS VISIT:  New Prescriptions   EPINEPHRINE 0.3 MG/0.3 ML IJ SOAJ INJECTION    Inject 0.3 mLs (0.3 mg total) into the muscle once.   FAMOTIDINE (PEPCID) 20 MG TABLET    Take 1 tablet (20 mg total) by mouth daily.   PREDNISONE (DELTASONE) 20 MG TABLET    Take 3 tablets (60 mg total) by mouth daily.     Note:  This document was prepared using Dragon voice recognition software and may include unintentional dictation errors.    Rudene Re, MD 07/19/17 [phone]

## 2017-07-19 NOTE — ED Triage Notes (Signed)
Pt reports has been on seroquel for one month and missed a few doses. Pt reports last took seroquel yesterday. Pt reports broke out in hives three days ago. Pt reports rash over entire body and feeling it in her throat as well. Pt scratching all over in triage. Pt took Benadryl 50 mg this morning without relief.

## 2017-07-22 ENCOUNTER — Encounter: Payer: Self-pay | Admitting: Family Medicine

## 2017-07-22 ENCOUNTER — Ambulatory Visit (INDEPENDENT_AMBULATORY_CARE_PROVIDER_SITE_OTHER): Payer: BLUE CROSS/BLUE SHIELD | Admitting: Family Medicine

## 2017-07-22 VITALS — BP 120/76 | HR 82 | Temp 98.3°F | Resp 16 | Ht 64.0 in | Wt 184.2 lb

## 2017-07-22 DIAGNOSIS — T7840XD Allergy, unspecified, subsequent encounter: Secondary | ICD-10-CM | POA: Diagnosis not present

## 2017-07-22 DIAGNOSIS — R21 Rash and other nonspecific skin eruption: Secondary | ICD-10-CM | POA: Diagnosis not present

## 2017-07-22 DIAGNOSIS — L930 Discoid lupus erythematosus: Secondary | ICD-10-CM

## 2017-07-22 MED ORDER — FAMOTIDINE 20 MG PO TABS: 20.0000 mg | ORAL_TABLET | Freq: Every day | ORAL | 0 refills | Status: DC

## 2017-07-22 MED ORDER — PREDNISONE 10 MG PO TABS: 10.0000 mg | ORAL_TABLET | Freq: Every day | ORAL | 0 refills | Status: DC

## 2017-07-22 MED ORDER — HYDROXYZINE HCL 25 MG PO TABS: 25.0000 mg | ORAL_TABLET | Freq: Three times a day (TID) | ORAL | 0 refills | Status: DC | PRN

## 2017-07-22 NOTE — Patient Instructions (Addendum)
Take 50mg  Prednisone today (2.5tablets of what you have left), then fill new prescription and follow those directions.  STOP taking Benadryl while taking Hydroxyzine.  Tepid (Luke-Warm) baths with Colloidal Oatmeal.  Keep your appointment with the Psychiatrist tomorrow. Schedule appointment with Dermatologist as soon as possible.  Please go to ER immediately if you have any new or worsening symptoms.  Drug Rash A drug rash is a change in the color or texture of the skin that is caused by a drug. It can develop minutes, hours, or days after the person takes the drug. What are the causes? This condition is usually caused by a drug allergy. It can also be caused by exposure to sunlight after taking a drug that makes the skin sensitive to light. Drugs that commonly cause rashes include:  Penicillin.  Antibiotic medicines.  Medicines that treat seizures.  Medicines that treat cancer (chemotherapy).  Aspirin and other nonsteroidal anti-inflammatory drugs (NSAIDs).  Injectable dyes that contain iodine.  Insulin.  What are the signs or symptoms? Symptoms of this condition include:  Redness.  Tiny bumps.  Peeling.  Itching.  Itchy welts (hives).  Swelling.  The rash may appear on a small area of skin or all over the body. How is this diagnosed? To diagnose the condition, your health care provider will do a physical exam. He or she may also order tests to find out which drug caused the rash. Tests to find the cause of a rash include:  Skin tests.  Blood tests.  Drug challenge. For this test, you stop taking all of the drugs that you do not need to take, and then you start taking them again by adding back one of the drugs at a time.  How is this treated? A drug rash may be treated with medicines, including:  Antihistamines. These may be given to relieve itching.  An NSAID. This may be given to reduce swelling and treat pain.  A steroid drug. This may be given to  reduce swelling.  The rash usually goes away when the person stops taking the drug that caused it. Follow these instructions at home:  Take medicines only as directed by your health care provider.  Let all of your health care providers know about any drug reactions you have had in the past.  If you have hives, take a cool shower or use a cool compress to relieve itchiness. Contact a health care provider if:  You have a fever.  Your rash is not going away.  Your rash gets worse.  Your rash comes back.  You have wheezing or coughing. Get help right away if:  You start to have breathing problems.  You start to have shortness of breath.  You face or throat starts to swell.  You have severe weakness with dizziness or fainting.  You have chest pain. This information is not intended to replace advice given to you by your health care provider. Make sure you discuss any questions you have with your health care provider. Document Released: 11/12/2004 Document Revised: 03/12/2016 Document Reviewed: 08/01/2014 Elsevier Interactive Patient Education  [employer].

## 2017-07-22 NOTE — Progress Notes (Signed)
Name: Erika Williamson   MRN: [id number]    DOB: 12/22/1975   Date:07/22/2017       Progress Note  Subjective  Chief Complaint  Chief Complaint  Patient presents with  . Allergic Reaction    seen in ER 10/1 for rash and itching, patient stated this came from [facility]    HPI  Patient presents with ongoing Allergic reaction - rash/hives, that is diffuse across her entire body sparing vaginal area only.  Patient denies vomiting, diarrhea, difficulty breathing, difficulty swallowing, angioedema, tongue swelling, stridor, wheezing.  She has had continued   She was seen in ER on 07/19/2017 - was given IV Famotadine, Benadryl, and NS. She was given a new Rx for Epipen but has not filled the Rx as of yet.  Prednisone 49m - last dose is today.  Taking benadryl 561mq4H, Zyrtec Qdaily, Cortisone cream TID.  Dr. EkDaron Offers her psychiatrist, and she will be seeing him tomorrow morning. She was started on Seroquel 6 weeks ago and this is the only medication change recently - last dose was 6 days ago. No new lotions/products/foods/drinks/detergents.   Patient Active Problem List   Diagnosis Date Noted  . Cat bite of forearm 06/17/2016  . Chronic obstructive pulmonary disease ([place]07/17/2017  . Contact dermatitis due to poison oak 04/03/2016  . Migraine without aura 04/03/2016  . NSAIDs adverse reaction 09/09/2015  . MDD (major depressive disorder), recurrent episode, moderate ([place]10/12/2014  . Gluten intolerance 06/19/2015  . Tobacco abuse   . Vitamin D deficiency disease   . HPV (human papilloma virus) infection   . High grade squamous intraepithelial cervical dysplasia   . GERD (gastroesophageal reflux disease)     Social History  Substance Use Topics  . Smoking status: Current Every Day Smoker    Packs/day: 1.00    Years: 22.00    Types: Cigarettes    Start date: 09/03/1993  . Smokeless tobacco: Never Used  . Alcohol use No     Current Outpatient Prescriptions:  .  baclofen  (LIORESAL) 10 MG tablet, Take 10 mg by mouth 2 (two) times daily., Disp: , Rfl:  .  famotidine (PEPCID) 20 MG tablet, Take 1 tablet (20 mg total) by mouth daily., Disp: 4 tablet, Rfl: 0 .  lamoTRIgine (LAMICTAL) 200 MG tablet, Take 1 tablet (200 mg total) by mouth daily., Disp: 90 tablet, Rfl: 1 .  PARoxetine (PAXIL) 40 MG tablet, Take 1 tablet (40 mg total) by mouth daily., Disp: 90 tablet, Rfl: 1 .  predniSONE (DELTASONE) 20 MG tablet, Take 3 tablets (60 mg total) by mouth daily., Disp: 12 tablet, Rfl: 0 .  rizatriptan (MAXALT) 10 MG tablet, 10 mg as needed. , Disp: , Rfl:  .  QUEtiapine (SEROQUEL) 50 MG tablet, , Disp: , Rfl:   Allergies  Allergen Reactions  . Gabapentin Anaphylaxis  . Ivp Dye [Iodinated Diagnostic Agents]   . Ketorolac Anaphylaxis  . Metrizamide   . Nsaids Anaphylaxis  . Other Anaphylaxis  . Tramadol Anaphylaxis  . Acyclovir And Related   . Erythromycin Other (See Comments)  . Famciclovir   . Levaquin [Levofloxacin In D5w] Other (See Comments)    Muscle aches  . Levofloxacin Other (See Comments)  . Tolmetin   . Tramadol Hcl   . Ultram [Tramadol Hcl]   . Compazine [Prochlorperazine Edisylate] Rash and Other (See Comments)    Muscle aches  . Erythromycin Base Rash  . Naprosyn [Naproxen] Itching  . Pristiq [Desvenlafaxine] Rash and  Other (See Comments)    Muscle aches    ROS  Ten systems reviewed and is negative except as mentioned in HPI  Objective  Vitals:   07/22/17 1010  BP: 120/76  Pulse: 82  Resp: 16  Temp: 98.3 F (36.8 C)  TempSrc: Oral  SpO2: 96%  Weight: 184 lb 3.2 oz (83.6 kg)  Height: '5\' 4"'  (1.626 m)   Body mass index is 31.62 kg/m.  Nursing Note and Vital Signs reviewed.  Physical Exam  Constitutional: Patient appears well-developed and well-nourished. Obese No distress.  HEENT: head atraumatic, normocephalic, neck supple without lymphadenopathy, oropharynx pink and moist without exudate Cardiovascular: Normal rate, regular  rhythm, S1/S2 present.  No murmur or rub heard. No BLE edema. Pulmonary/Chest: Effort normal and breath sounds clear. No respiratory distress or retractions. Psychiatric: Patient has a normal mood and affect. behavior is normal. Judgment and thought content normal. Skin: Diffuse maculopapular rash across trunk, extremities, small area on cheeks and chin, and forehead, scalp,  Recent Results (from the past 2160 hour(s))  Comprehensive metabolic panel     Status: Abnormal   Collection Time: 05/22/17  4:19 PM  Result Value Ref Range   Sodium 132 (L) 135 - 145 mmol/L   Potassium 3.9 3.5 - 5.1 mmol/L   Chloride 99 (L) 101 - 111 mmol/L   CO2 24 22 - 32 mmol/L   Glucose, Bld 98 65 - 99 mg/dL   BUN 8 6 - 20 mg/dL   Creatinine, Ser 0.66 0.44 - 1.00 mg/dL   Calcium 9.3 8.9 - 10.3 mg/dL   Total Protein 8.1 6.5 - 8.1 g/dL   Albumin 4.6 3.5 - 5.0 g/dL   AST 22 15 - 41 U/L   ALT 17 14 - 54 U/L   Alkaline Phosphatase 73 38 - 126 U/L   Total Bilirubin 0.5 0.3 - 1.2 mg/dL   GFR calc non Af Amer >60 >60 mL/min   GFR calc Af Amer >60 >60 mL/min    Comment: (NOTE) The eGFR has been calculated using the CKD EPI equation. This calculation has not been validated in all clinical situations. eGFR's persistently <60 mL/min signify possible Chronic Kidney Disease.    Anion gap 9 5 - 15  Ethanol     Status: None   Collection Time: 05/22/17  4:19 PM  Result Value Ref Range   Alcohol, Ethyl (B) <5 <5 mg/dL    Comment:        LOWEST DETECTABLE LIMIT FOR SERUM ALCOHOL IS 5 mg/dL FOR MEDICAL PURPOSES ONLY   CBC with Diff     Status: Abnormal   Collection Time: 05/22/17  4:19 PM  Result Value Ref Range   WBC 12.5 (H) 3.6 - 11.0 K/uL   RBC 4.71 3.80 - 5.20 MIL/uL   Hemoglobin 15.1 12.0 - 16.0 g/dL   HCT 43.3 35.0 - 47.0 %   MCV 91.9 80.0 - 100.0 fL   MCH 32.1 26.0 - 34.0 pg   MCHC 35.0 32.0 - 36.0 g/dL   RDW 13.3 11.5 - 14.5 %   Platelets 379 150 - 440 K/uL   Neutrophils Relative % 65 %   Neutro  Abs 8.2 (H) 1.4 - 6.5 K/uL   Lymphocytes Relative 25 %   Lymphs Abs 3.1 1.0 - 3.6 K/uL   Monocytes Relative 7 %   Monocytes Absolute 0.9 0.2 - 0.9 K/uL   Eosinophils Relative 2 %   Eosinophils Absolute 0.2 0 - 0.7 K/uL   Basophils Relative  1 %   Basophils Absolute 0.1 0 - 0.1 K/uL  Salicylate level     Status: None   Collection Time: 05/22/17  4:19 PM  Result Value Ref Range   Salicylate Lvl <1.6 2.8 - 30.0 mg/dL  Acetaminophen level     Status: Abnormal   Collection Time: 05/22/17  4:19 PM  Result Value Ref Range   Acetaminophen (Tylenol), Serum <10 (L) 10 - 30 ug/mL    Comment:        THERAPEUTIC CONCENTRATIONS VARY SIGNIFICANTLY. A RANGE OF 10-30 ug/mL MAY BE AN EFFECTIVE CONCENTRATION FOR MANY PATIENTS. HOWEVER, SOME ARE BEST TREATED AT CONCENTRATIONS OUTSIDE THIS RANGE. ACETAMINOPHEN CONCENTRATIONS >150 ug/mL AT 4 HOURS AFTER INGESTION AND >50 ug/mL AT 12 HOURS AFTER INGESTION ARE OFTEN ASSOCIATED WITH TOXIC REACTIONS.   Urine Drug Screen, Qualitative     Status: None   Collection Time: 05/22/17  4:44 PM  Result Value Ref Range   Tricyclic, Ur Screen NONE DETECTED NONE DETECTED   Amphetamines, Ur Screen NONE DETECTED NONE DETECTED   MDMA (Ecstasy)Ur Screen NONE DETECTED NONE DETECTED   Cocaine Metabolite,Ur [place] NONE DETECTED NONE DETECTED   Opiate, Ur Screen NONE DETECTED NONE DETECTED   Phencyclidine (PCP) Ur S NONE DETECTED NONE DETECTED   Cannabinoid 50 Ng, Ur [place] NONE DETECTED NONE DETECTED   Barbiturates, Ur Screen NONE DETECTED NONE DETECTED   Benzodiazepine, Ur Scrn NONE DETECTED NONE DETECTED   Methadone Scn, Ur NONE DETECTED NONE DETECTED    Comment: (NOTE) 109  Tricyclics, urine               Cutoff 1000 ng/mL 200  Amphetamines, urine             Cutoff 1000 ng/mL 300  MDMA (Ecstasy), urine           Cutoff 500 ng/mL 400  Cocaine Metabolite, urine       Cutoff 300 ng/mL 500  Opiate, urine                   Cutoff 300 ng/mL 600  Phencyclidine (PCP),  urine      Cutoff 25 ng/mL 700  Cannabinoid, urine              Cutoff 50 ng/mL 800  Barbiturates, urine             Cutoff 200 ng/mL 900  Benzodiazepine, urine           Cutoff 200 ng/mL 1000 Methadone, urine                Cutoff 300 ng/mL 1100 1200 The urine drug screen provides only a preliminary, unconfirmed 1300 analytical test result and should not be used for non-medical 1400 purposes. Clinical consideration and professional judgment should 1500 be applied to any positive drug screen result due to possible 1600 interfering substances. A more specific alternate chemical method 1700 must be used in order to obtain a confirmed analytical result.  1800 Gas chromato graphy / mass spectrometry (GC/MS) is the preferred 1900 confirmatory method.   Urinalysis, Complete w Microscopic     Status: Abnormal   Collection Time: 05/22/17  4:45 PM  Result Value Ref Range   Color, Urine STRAW (A) YELLOW   APPearance CLEAR (A) CLEAR   Specific Gravity, Urine 1.003 (L) 1.005 - 1.030   pH 5.0 5.0 - 8.0   Glucose, UA NEGATIVE NEGATIVE mg/dL   Hgb urine dipstick MODERATE (A) NEGATIVE   Bilirubin Urine NEGATIVE  NEGATIVE   Ketones, ur NEGATIVE NEGATIVE mg/dL   Protein, ur NEGATIVE NEGATIVE mg/dL   Nitrite NEGATIVE NEGATIVE   Leukocytes, UA NEGATIVE NEGATIVE   RBC / HPF 0-5 0 - 5 RBC/hpf   WBC, UA 0-5 0 - 5 WBC/hpf   Bacteria, UA RARE (A) NONE SEEN   Squamous Epithelial / LPF 0-5 (A) NONE SEEN  Pregnancy, urine POC     Status: None   Collection Time: 05/22/17  5:35 PM  Result Value Ref Range   Preg Test, Ur NEGATIVE NEGATIVE    Comment:        THE SENSITIVITY OF THIS METHODOLOGY IS >24 mIU/mL      Assessment & Plan  1. Allergic reaction, subsequent encounter - predniSONE (DELTASONE) 10 MG tablet; Take 1 tablet (10 mg total) by mouth daily with breakfast. Day1:4tabs, Day2:3tabs, Day3:2tabs, Day4:1tab, Day5:1tab  Dispense: 11 tablet; Refill: 0 - hydrOXYzine (ATARAX/VISTARIL) 25 MG  tablet; Take 1 tablet (25 mg total) by mouth every 8 (eight) hours as needed for itching.  Dispense: 15 tablet; Refill: 0 - famotidine (PEPCID) 20 MG tablet; Take 1 tablet (20 mg total) by mouth daily.  Dispense: 5 tablet; Refill: 0  2. Rash - predniSONE (DELTASONE) 10 MG tablet; Take 1 tablet (10 mg total) by mouth daily with breakfast. Day1:4tabs, Day2:3tabs, Day3:2tabs, Day4:1tab, Day5:1tab  Dispense: 11 tablet; Refill: 0 - hydrOXYzine (ATARAX/VISTARIL) 25 MG tablet; Take 1 tablet (25 mg total) by mouth every 8 (eight) hours as needed for itching.  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 Follow up and care instructions discussed and provided in AVS.

## 2017-07-23 ENCOUNTER — Encounter (HOSPITAL_COMMUNITY): Payer: Self-pay | Admitting: Psychiatry

## 2017-07-23 ENCOUNTER — Ambulatory Visit (INDEPENDENT_AMBULATORY_CARE_PROVIDER_SITE_OTHER): Payer: BLUE CROSS/BLUE SHIELD | Admitting: Psychiatry

## 2017-07-23 VITALS — BP 118/84 | HR 72 | Ht 63.5 in | Wt 184.2 lb

## 2017-07-23 DIAGNOSIS — F17210 Nicotine dependence, cigarettes, uncomplicated: Secondary | ICD-10-CM

## 2017-07-23 DIAGNOSIS — Z79899 Other long term (current) drug therapy: Secondary | ICD-10-CM

## 2017-07-23 DIAGNOSIS — F4312 Post-traumatic stress disorder, chronic: Secondary | ICD-10-CM

## 2017-07-23 DIAGNOSIS — Z818 Family history of other mental and behavioral disorders: Secondary | ICD-10-CM

## 2017-07-23 DIAGNOSIS — F331 Major depressive disorder, recurrent, moderate: Secondary | ICD-10-CM

## 2017-07-23 MED ORDER — PAROXETINE HCL 40 MG PO TABS: 40.0000 mg | ORAL_TABLET | Freq: Every day | ORAL | 1 refills | Status: DC

## 2017-07-23 MED ORDER — LAMOTRIGINE 200 MG PO TABS: 200.0000 mg | ORAL_TABLET | Freq: Every day | ORAL | 1 refills | Status: DC

## 2017-07-23 NOTE — Progress Notes (Signed)
BH MD/PA/NP OP Progress Note  07/23/2017 9:12 AM [name]  MRN:  [id number]  Chief Complaint: recent rash HPI: Erika Williamson was recently seen in the ER on 07/19/17 for diffuse erythematous rash. We notably increased her Lamictal in July, but the rash appears to have started well after 12 weeks of increasing this medication, so the likelihood of Lamictal associated SJS is quite low. I examined her rash today and there was no mucocutaneous involvement in her gums or eyelids, and it does appear to be resolving.  Seroquel was re-started in August, but the patient has been on and off this medication since June 2018.  I reviewed to the ER note, where the patient reported that every day after taking her Seroquel, she breaks out into hives and this is been gradually worsening. Patient has not brought this to our attention over the past 2 months.  Patient was placed on prednisone, and is currently on taper.   She reports that she is no longer taking Seroquel, and her mood has been good over the past few months, she shares that she had her Mirena IUD taken out, and she attributes a lot of her mood improvements to this. She reports that her external stressors remained the same but she finds that she is better able to handle them. She denies any unsafe thoughts and denies any substance abuse.  She reports that she is sleeping well at night. She reports that she is keeping a good appetite. Things are going well for her work. She is spending more time with friends and engaging in activities that she typically enjoys such as going for walks with the dogs. I spent time with her reviewing her current medications and pre-existing depressive symptoms before the Mirena IUD. We agreed to continue Paxil and Lamictal at the current dose and follow up in 3 months.  Visit Diagnosis:    ICD-10-CM   1. Chronic post-traumatic stress disorder (PTSD) F43.12 PARoxetine (PAXIL) 40 MG tablet    lamoTRIgine  (LAMICTAL) 200 MG tablet  2. Depression, major, recurrent, moderate ([name]) F33.1     Past Psychiatric History: See intake H&P for full details. Reviewed, with no updates at this time.   Past Medical History:  Past Medical History:  Diagnosis Date  . 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 Anxiety   . Atypical face pain    Left Sided  . COPD (chronic obstructive pulmonary disease) ([place])   . Depression   . Foot fracture Left X2  . GERD (gastroesophageal reflux disease)   . HPV (human papilloma virus) infection   . Migraine without aura 04/03/2016  . Moderate recurrent major depression ([name]) 07/22/2015  . Shingles   . Sinusitis   . Tension headache   . Tobacco abuse   . Tonsillitis   . Vitamin D deficiency disease     Past Surgical History:  Procedure Laterality Date  . birth control removed  06/09/2017  . CESAREAN SECTION     X2  . CHOLECYSTECTOMY     Dr Burt Knack  . COLONOSCOPY WITH PROPOFOL N/A 05/03/2015   Procedure: COLONOSCOPY WITH PROPOFOL;  Surgeon: Josefine Class, MD;  Location: [facility] ENDOSCOPY;  Service: Endoscopy;  Laterality: N/A;  . ESOPHAGOGASTRODUODENOSCOPY N/A 05/03/2015   Procedure: ESOPHAGOGASTRODUODENOSCOPY (EGD);  Surgeon: Josefine Class, MD;  Location: [facility] ENDOSCOPY;  Service: Endoscopy;  Laterality: N/A;  . 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  TONSILLECTOMY    . TUBAL LIGATION    . WISDOM TOOTH EXTRACTION      Family Psychiatric History: See intake H&P for full details. Reviewed, with no updates at this time.   Family History:  Family History  Problem Relation Age of Onset  . Diabetes Mother   . Hypertension Mother   . COPD Mother   . Stroke Mother   . Osteoporosis Mother   . Depression Mother   . Cancer Father 36       colorectal, spread to lung  . Alcohol abuse Father   . Hypertension Father   . Depression Father   . Diabetes Sister   . COPD Sister   . Anxiety disorder Sister    . Depression Sister   . Fibromyalgia Sister   . Asthma Sister   . ADD / ADHD Son   . ADD / ADHD Daughter     Social History:  Social History   Social History  . Marital status: Married    Spouse name: N/A  . Number of children: N/A  . Years of education: N/A   Social History Main Topics  . Smoking status: Current Every Day Smoker    Packs/day: 1.00    Years: 22.00    Types: Cigarettes    Start date: 09/03/1993  . Smokeless tobacco: Never Used  . Alcohol use No  . Drug use: No  . Sexual activity: Yes    Partners: Male    Birth control/ protection: None   Other Topics Concern  . None   Social History Narrative  . None    Allergies:  Allergies  Allergen Reactions  . Gabapentin Anaphylaxis  . Ivp Dye [Iodinated Diagnostic Agents]   . Ketorolac Anaphylaxis  . Metrizamide   . Nsaids Anaphylaxis  . Other Anaphylaxis  . Tramadol Anaphylaxis  . Acyclovir And Related   . Erythromycin Other (See Comments)  . Famciclovir   . Levaquin [Levofloxacin In D5w] Other (See Comments)    Muscle aches  . Levofloxacin Other (See Comments)  . Tolmetin   . Tramadol Hcl   . Ultram [Tramadol Hcl]   . Compazine [Prochlorperazine Edisylate] Rash and Other (See Comments)    Muscle aches  . Erythromycin Base Rash  . Naprosyn [Naproxen] Itching  . Pristiq [Desvenlafaxine] Rash and Other (See Comments)    Muscle aches    Metabolic Disorder Labs: No results found for: HGBA1C, MPG No results found for: PROLACTIN No results found for: CHOL, TRIG, HDL, CHOLHDL, VLDL, LDLCALC Lab Results  Component Value Date   TSH 1.710 07/22/2015    Therapeutic Level Labs: No results found for: LITHIUM No results found for: VALPROATE No components found for:  CBMZ  Current Medications: Current Outpatient Prescriptions  Medication Sig Dispense Refill  . baclofen (LIORESAL) 10 MG tablet Take 10 mg by mouth 2 (two) times daily.    . famotidine (PEPCID) 20 MG tablet Take 1 tablet (20 mg  total) by mouth daily. 5 tablet 0  . hydrocortisone 2.5 % cream Apply topically as needed.    . hydrOXYzine (ATARAX/VISTARIL) 25 MG tablet Take 1 tablet (25 mg total) by mouth every 8 (eight) hours as needed for itching. 15 tablet 0  . lamoTRIgine (LAMICTAL) 200 MG tablet Take 1 tablet (200 mg total) by mouth daily. 90 tablet 1  . PARoxetine (PAXIL) 40 MG tablet Take 1 tablet (40 mg total) by mouth daily. 90 tablet 1  . predniSONE (DELTASONE) 10 MG tablet Take 1 tablet (  10 mg total) by mouth daily with breakfast. Day1:4tabs, Day2:3tabs, Day3:2tabs, Day4:1tab, Day5:1tab 11 tablet 0  . rizatriptan (MAXALT) 10 MG tablet 10 mg as needed.      No current facility-administered medications for this visit.      Musculoskeletal: Strength & Muscle Tone: within normal limits Gait & Station: normal Patient leans: N/A  Psychiatric Specialty Exam: Review of Systems  Constitutional: Negative.   HENT: Negative.   Respiratory: Negative.   Cardiovascular: Negative.   Gastrointestinal: Negative.   Musculoskeletal: Negative.   Skin: Positive for itching and rash.  Neurological: Negative.   Psychiatric/Behavioral: Negative.     Blood pressure 118/84, pulse 72, height 5' 3.5" (1.613 m), weight 184 lb 3.2 oz (83.6 kg), last menstrual period 07/09/2017.Body mass index is 32.12 kg/m.  General Appearance: Casual and Fairly Groomed  Eye Contact:  Fair  Speech:  Clear and Coherent  Volume:  Normal  Mood:  Euthymic  Affect:  Congruent  Thought Process:  Goal Directed  Orientation:  Full (Time, Place, and Person)  Thought Content: Logical   Suicidal Thoughts:  No  Homicidal Thoughts:  No  Memory:  Immediate;   Fair  Judgement:  Fair  Insight:  Fair and Present  Psychomotor Activity:  Normal  Concentration:  Concentration: Fair  Recall:  Good  Fund of Knowledge: Good  Language: Good  Akathisia:  Negative  Handed:  Right  AIMS (if indicated): not done  Assets:  Communication Skills Desire for  Improvement Financial Resources/Insurance Housing Transportation Vocational/Educational  ADL's:  Intact  Cognition: WNL  Sleep:  Fair   Screenings: PHQ2-9     Office Visit from 06/15/2016 in [facility] Office Visit from 04/03/2016 in [facility] Counselor from 02/26/2016 in [place] Office Visit from 08/07/2015 in [place] Visit from 07/31/2015 in [place]  PHQ-2 Total Score  4  3  6  4  6   PHQ-9 Total Score  15  16  27  13  21        Assessment and Plan: Erika Williamson is a 41 year old female with chronic PTSD and major depressive disorder who presents today for medication management follow-up. She has a medical history of lupus, for which she is followed by dermatology. She had a recent drug reaction to Seroquel, which appears to have been chronically worsening. She does appear to be doing well without Seroquel, continuing on Lamictal and Paxil. I have a very low suspicion of any SJS, and her skin rash does appear to be resolving with discontinuation of Seroquel. Her mood appears to be better in the setting of having her Mirena IUD taken out. Will proceed as below and follow up in 3 months.  1. Chronic post-traumatic stress disorder (PTSD)   2. Depression, major, recurrent, moderate ([facility])    Continue Lamictal 200 mg daily and Paxil 40 mg daily Discontinue Seroquel Return to clinic in 3 months Encouraged her to continue in individual therapy  I spent 30 minutes with the patient in direct care and counseling. We discussed recent medical issues related to drug rash, and removal of IUD, overall improvement of symptoms and treatment plan moving forward.    Aundra Dubin, MD 07/23/2017, 9:12 AM

## 2017-07-23 NOTE — Progress Notes (Signed)
Pt believes the SEROQUEL is causing her to her allergic reactions.

## 2017-07-26 ENCOUNTER — Encounter: Payer: Self-pay | Admitting: Family Medicine

## 2017-07-26 ENCOUNTER — Ambulatory Visit (INDEPENDENT_AMBULATORY_CARE_PROVIDER_SITE_OTHER): Payer: BLUE CROSS/BLUE SHIELD | Admitting: Family Medicine

## 2017-07-26 VITALS — BP 134/72 | HR 90 | Temp 98.9°F | Resp 18 | Ht 64.0 in | Wt 185.5 lb

## 2017-07-26 DIAGNOSIS — R05 Cough: Secondary | ICD-10-CM

## 2017-07-26 DIAGNOSIS — L930 Discoid lupus erythematosus: Secondary | ICD-10-CM | POA: Diagnosis not present

## 2017-07-26 DIAGNOSIS — R21 Rash and other nonspecific skin eruption: Secondary | ICD-10-CM

## 2017-07-26 DIAGNOSIS — R059 Cough, unspecified: Secondary | ICD-10-CM

## 2017-07-26 NOTE — Progress Notes (Signed)
Name: Erika Williamson   MRN: [id number]    DOB: Feb 27, 1976   Date:07/26/2017       Progress Note  Subjective  Chief Complaint  Chief Complaint  Patient presents with  . Follow-up    Rash  . Allergic Reaction    Seen Raquel Sarna on 07/22/2017, was given Hydroxyzine, Prednisone, and Pepcid. Patient states when medication starts to wear off she gets very itchy, patient is scratching so bad she has bruises.  . Cough    Onset-yesterday, hacky cough, "feels like something is choking her".    HPI  Rash/lupus: she is treated by Dr. Rutherford Nail for lupus, she developed a different type of rash that was very pruriginous. She states that she was off seroquel for two days and when she resumed it caused the rash, she is on Lamictal and Paxil given psychiatrist ( Dr. Daron Offer). She was seen at [facility] first one week ago, followed by seeing Raelyn Ensign NP and given prednisone taper. She is also on Zyrtec, Atarax that helps but wears off within the 8 hour period. Still bothering her sleep. She was advised to follow up with Dr. Rutherford Nail this week  Cough: she has a history of GERD, used to be on high Nexium , but off medication for the past year. She developed choking and indigestion over the past day, on prednisone for the past 5 days. Explained cough may be triggered by GERD. Advised to try tums, take prednisone with food, denies blood in stools, no vomiting. Also bland diet. No fever, congestion or wheezing at this time.    Patient Active Problem List   Diagnosis Date Noted  . 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 Cat bite of forearm 06/17/2016  . Chronic obstructive pulmonary disease ([place]) 05/04/2016  . Contact dermatitis due to poison oak 04/03/2016  . Migraine without aura 04/03/2016  . NSAIDs adverse reaction 09/09/2015  . MDD (major depressive disorder), recurrent episode, moderate ([place]) 07/22/2015  . Gluten intolerance 06/19/2015  . Tobacco abuse   . Vitamin D deficiency disease   . HPV (human  papilloma virus) infection   . High grade squamous intraepithelial cervical dysplasia   . GERD (gastroesophageal reflux disease)     Past Surgical History:  Procedure Laterality Date  . birth control removed  06/09/2017  . CESAREAN SECTION     X2  . CHOLECYSTECTOMY     Dr Burt Knack  . COLONOSCOPY WITH PROPOFOL N/A 05/03/2015   Procedure: COLONOSCOPY WITH PROPOFOL;  Surgeon: Josefine Class, MD;  Location: [facility] ENDOSCOPY;  Service: Endoscopy;  Laterality: N/A;  . ESOPHAGOGASTRODUODENOSCOPY N/A 05/03/2015   Procedure: ESOPHAGOGASTRODUODENOSCOPY (EGD);  Surgeon: Josefine Class, MD;  Location: [facility] ENDOSCOPY;  Service: Endoscopy;  Laterality: N/A;  . 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 TONSILLECTOMY    . TUBAL LIGATION    . WISDOM TOOTH EXTRACTION      Family History  Problem Relation Age of Onset  . Diabetes Mother   . Hypertension Mother   . COPD Mother   . Stroke Mother   . Osteoporosis Mother   . Depression Mother   . Cancer Father 52       colorectal, spread to lung  . Alcohol abuse Father   . Hypertension Father   . Depression Father   . Diabetes Sister   . COPD Sister   . Anxiety disorder Sister   . Depression Sister   . Fibromyalgia Sister   . Asthma Sister   . ADD /  ADHD Son   . ADD / ADHD Daughter     Social History   Social History  . Marital status: Married    Spouse name: N/A  . Number of children: N/A  . Years of education: N/A   Occupational History  . Not on file.   Social History Main Topics  . Smoking status: Current Every Day Smoker    Packs/day: 1.00    Years: 22.00    Types: Cigarettes    Start date: 09/03/1993  . Smokeless tobacco: Never Used  . Alcohol use No  . Drug use: No  . Sexual activity: Yes    Partners: Male    Birth control/ protection: None   Other Topics Concern  . Not on file   Social History Narrative  . No narrative on file     Current Outpatient Prescriptions:  .  baclofen (LIORESAL) 10 MG tablet, Take  10 mg by mouth 2 (two) times daily., Disp: , Rfl:  .  famotidine (PEPCID) 20 MG tablet, Take 1 tablet (20 mg total) by mouth daily., Disp: 5 tablet, Rfl: 0 .  hydrocortisone 2.5 % cream, Apply topically as needed., Disp: , Rfl:  .  hydrOXYzine (ATARAX/VISTARIL) 25 MG tablet, Take 1 tablet (25 mg total) by mouth every 8 (eight) hours as needed for itching., Disp: 15 tablet, Rfl: 0 .  lamoTRIgine (LAMICTAL) 200 MG tablet, Take 1 tablet (200 mg total) by mouth daily., Disp: 90 tablet, Rfl: 1 .  PARoxetine (PAXIL) 40 MG tablet, Take 1 tablet (40 mg total) by mouth daily., Disp: 90 tablet, Rfl: 1 .  predniSONE (DELTASONE) 10 MG tablet, Take 1 tablet (10 mg total) by mouth daily with breakfast. Day1:4tabs, Day2:3tabs, Day3:2tabs, Day4:1tab, Day5:1tab, Disp: 11 tablet, Rfl: 0 .  rizatriptan (MAXALT) 10 MG tablet, 10 mg as needed. , Disp: , Rfl:  .  predniSONE (DELTASONE) 20 MG tablet, , Disp: , Rfl:   Allergies  Allergen Reactions  . Gabapentin Anaphylaxis  . Ivp Dye [Iodinated Diagnostic Agents]   . Ketorolac Anaphylaxis  . Metrizamide   . Nsaids Anaphylaxis  . Other Anaphylaxis  . Tramadol Anaphylaxis  . Acyclovir And Related   . Erythromycin Other (See Comments)  . Famciclovir   . Levaquin [Levofloxacin In D5w] Other (See Comments)    Muscle aches  . Levofloxacin Other (See Comments)  . Tolmetin   . Tramadol Hcl   . Ultram [Tramadol Hcl]   . Compazine [Prochlorperazine Edisylate] Rash and Other (See Comments)    Muscle aches  . Erythromycin Base Rash  . Naprosyn [Naproxen] Itching  . Pristiq [Desvenlafaxine] Rash and Other (See Comments)    Muscle aches     ROS  Ten systems reviewed and is negative except as mentioned in HPI   Objective  Vitals:   07/26/17 0842  BP: 134/72  Pulse: 90  Resp: 18  Temp: 98.9 F (37.2 C)  TempSrc: Oral  SpO2: 96%  Weight: 185 lb 8 oz (84.1 kg)  Height: _0  (1.626 m)    Body mass index is 31.84 kg/m.  Physical  Exam  Constitutional: Patient appears well-developed and well-nourished. Obese  No distress.  HEENT: head atraumatic, normocephalic, pupils equal and reactive to light, neck supple, throat within normal limits Cardiovascular: Normal rate, regular rhythm and normal heart sounds.  No murmur heard. No BLE edema. Pulmonary/Chest: Effort normal and breath sounds normal. No respiratory distress. Abdominal: Soft.  There is no tenderness. Skin: some excoriation from scratching, small papules, no  pustules, some petechia but no erythema at this time, drying out in most areas Psychiatric: Patient has a normal mood and affect. behavior is normal. Judgment and thought content normal.  Recent Results (from the past 2160 hour(s))  Comprehensive metabolic panel     Status: Abnormal   Collection Time: 05/22/17  4:19 PM  Result Value Ref Range   Sodium 132 (L) 135 - 145 mmol/L   Potassium 3.9 3.5 - 5.1 mmol/L   Chloride 99 (L) 101 - 111 mmol/L   CO2 24 22 - 32 mmol/L   Glucose, Bld 98 65 - 99 mg/dL   BUN 8 6 - 20 mg/dL   Creatinine, Ser 0.66 0.44 - 1.00 mg/dL   Calcium 9.3 8.9 - 10.3 mg/dL   Total Protein 8.1 6.5 - 8.1 g/dL   Albumin 4.6 3.5 - 5.0 g/dL   AST 22 15 - 41 U/L   ALT 17 14 - 54 U/L   Alkaline Phosphatase 73 38 - 126 U/L   Total Bilirubin 0.5 0.3 - 1.2 mg/dL   GFR calc non Af Amer >60 >60 mL/min   GFR calc Af Amer >60 >60 mL/min    Comment: (NOTE) The eGFR has been calculated using the CKD EPI equation. This calculation has not been validated in all clinical situations. eGFR's persistently <60 mL/min signify possible Chronic Kidney Disease.    Anion gap 9 5 - 15  Ethanol     Status: None   Collection Time: 05/22/17  4:19 PM  Result Value Ref Range   Alcohol, Ethyl (B) <5 <5 mg/dL    Comment:        LOWEST DETECTABLE LIMIT FOR SERUM ALCOHOL IS 5 mg/dL FOR MEDICAL PURPOSES ONLY   CBC with Diff     Status: Abnormal   Collection Time: 05/22/17  4:19 PM  Result Value Ref Range    WBC 12.5 (H) 3.6 - 11.0 K/uL   RBC 4.71 3.80 - 5.20 MIL/uL   Hemoglobin 15.1 12.0 - 16.0 g/dL   HCT 43.3 35.0 - 47.0 %   MCV 91.9 80.0 - 100.0 fL   MCH 32.1 26.0 - 34.0 pg   MCHC 35.0 32.0 - 36.0 g/dL   RDW 13.3 11.5 - 14.5 %   Platelets 379 150 - 440 K/uL   Neutrophils Relative % 65 %   Neutro Abs 8.2 (H) 1.4 - 6.5 K/uL   Lymphocytes Relative 25 %   Lymphs Abs 3.1 1.0 - 3.6 K/uL   Monocytes Relative 7 %   Monocytes Absolute 0.9 0.2 - 0.9 K/uL   Eosinophils Relative 2 %   Eosinophils Absolute 0.2 0 - 0.7 K/uL   Basophils Relative 1 %   Basophils Absolute 0.1 0 - 0.1 K/uL  Salicylate level     Status: None   Collection Time: 05/22/17  4:19 PM  Result Value Ref Range   Salicylate Lvl <3.8 2.8 - 30.0 mg/dL  Acetaminophen level     Status: Abnormal   Collection Time: 05/22/17  4:19 PM  Result Value Ref Range   Acetaminophen (Tylenol), Serum <10 (L) 10 - 30 ug/mL    Comment:        THERAPEUTIC CONCENTRATIONS VARY SIGNIFICANTLY. A RANGE OF 10-30 ug/mL MAY BE AN EFFECTIVE CONCENTRATION FOR MANY PATIENTS. HOWEVER, SOME ARE BEST TREATED AT CONCENTRATIONS OUTSIDE THIS RANGE. ACETAMINOPHEN CONCENTRATIONS >150 ug/mL AT 4 HOURS AFTER INGESTION AND >50 ug/mL AT 12 HOURS AFTER INGESTION ARE OFTEN ASSOCIATED WITH TOXIC REACTIONS.   Urine Drug Screen,  Qualitative     Status: None   Collection Time: 05/22/17  4:44 PM  Result Value Ref Range   Tricyclic, Ur Screen NONE DETECTED NONE DETECTED   Amphetamines, Ur Screen NONE DETECTED NONE DETECTED   MDMA (Ecstasy)Ur Screen NONE DETECTED NONE DETECTED   Cocaine Metabolite,Ur [place] NONE DETECTED NONE DETECTED   Opiate, Ur Screen NONE DETECTED NONE DETECTED   Phencyclidine (PCP) Ur S NONE DETECTED NONE DETECTED   Cannabinoid 50 Ng, Ur [place] NONE DETECTED NONE DETECTED   Barbiturates, Ur Screen NONE DETECTED NONE DETECTED   Benzodiazepine, Ur Scrn NONE DETECTED NONE DETECTED   Methadone Scn, Ur NONE DETECTED NONE DETECTED    Comment:  (NOTE) 161  Tricyclics, urine               Cutoff 1000 ng/mL 200  Amphetamines, urine             Cutoff 1000 ng/mL 300  MDMA (Ecstasy), urine           Cutoff 500 ng/mL 400  Cocaine Metabolite, urine       Cutoff 300 ng/mL 500  Opiate, urine                   Cutoff 300 ng/mL 600  Phencyclidine (PCP), urine      Cutoff 25 ng/mL 700  Cannabinoid, urine              Cutoff 50 ng/mL 800  Barbiturates, urine             Cutoff 200 ng/mL 900  Benzodiazepine, urine           Cutoff 200 ng/mL 1000 Methadone, urine                Cutoff 300 ng/mL 1100 1200 The urine drug screen provides only a preliminary, unconfirmed 1300 analytical test result and should not be used for non-medical 1400 purposes. Clinical consideration and professional judgment should 1500 be applied to any positive drug screen result due to possible 1600 interfering substances. A more specific alternate chemical method 1700 must be used in order to obtain a confirmed analytical result.  1800 Gas chromato graphy / mass spectrometry (GC/MS) is the preferred 1900 confirmatory method.   Urinalysis, Complete w Microscopic     Status: Abnormal   Collection Time: 05/22/17  4:45 PM  Result Value Ref Range   Color, Urine STRAW (A) YELLOW   APPearance CLEAR (A) CLEAR   Specific Gravity, Urine 1.003 (L) 1.005 - 1.030   pH 5.0 5.0 - 8.0   Glucose, UA NEGATIVE NEGATIVE mg/dL   Hgb urine dipstick MODERATE (A) NEGATIVE   Bilirubin Urine NEGATIVE NEGATIVE   Ketones, ur NEGATIVE NEGATIVE mg/dL   Protein, ur NEGATIVE NEGATIVE mg/dL   Nitrite NEGATIVE NEGATIVE   Leukocytes, UA NEGATIVE NEGATIVE   RBC / HPF 0-5 0 - 5 RBC/hpf   WBC, UA 0-5 0 - 5 WBC/hpf   Bacteria, UA RARE (A) NONE SEEN   Squamous Epithelial / LPF 0-5 (A) NONE SEEN  Pregnancy, urine POC     Status: None   Collection Time: 05/22/17  5:35 PM  Result Value Ref Range   Preg Test, Ur NEGATIVE NEGATIVE    Comment:        THE SENSITIVITY OF THIS METHODOLOGY IS >24  mIU/mL       PHQ2/9: Depression screen [facility] 2/9 06/15/2016 04/03/2016 08/07/2015 07/31/2015 07/22/2015  Decreased Interest 2 0 _0 Down, Depressed, Hopeless  _0 PHQ - 2 Score _1 Altered sleeping 3 3 0 0 1  Tired, decreased energy _2 Change in appetite _3 Feeling bad or failure about yourself  2 0 _4 Trouble concentrating 1 3 0 3 3  Moving slowly or fidgety/restless 0 1 1 0 0  Suicidal thoughts 1 0 _5 PHQ-9 Score _6 Difficult doing work/chores Very difficult Extremely dIfficult Not difficult at all Very difficult -  Some encounter information is confidential and restricted. Go to Review Flowsheets activity to see all data.     Fall Risk: Fall Risk  06/15/2016 04/03/2016 07/22/2015  Falls in the past year? No No No     Assessment & Plan  1. Lupus erythematosus tumidus  Sees dermatologist   2. Rash  Still present with a history of lupus advised to follow with Dermatologist ( Dr. Rutherford Nail ) , she will call him directly   3. Cough  Likely from prednisone, history of GERD and symptoms worse since yesterday, explained vagal irritation Discussed PPI for a few days, but she wants to hold off  -Red flags and when to present for emergency care or RTC including fever >101.39F, chest pain, shortness of breath, new/worsening/un-resolving symptoms,  reviewed with patient at time of visit. Follow up and care instructions discussed and provided in AVS.

## 2017-08-03 ENCOUNTER — Encounter: Payer: Self-pay | Admitting: Obstetrics and Gynecology

## 2017-08-03 ENCOUNTER — Ambulatory Visit (INDEPENDENT_AMBULATORY_CARE_PROVIDER_SITE_OTHER): Payer: BLUE CROSS/BLUE SHIELD | Admitting: Obstetrics and Gynecology

## 2017-08-03 VITALS — BP 123/79 | HR 96 | Ht 64.0 in | Wt 183.5 lb

## 2017-08-03 DIAGNOSIS — Z13220 Encounter for screening for lipoid disorders: Secondary | ICD-10-CM

## 2017-08-03 DIAGNOSIS — N920 Excessive and frequent menstruation with regular cycle: Secondary | ICD-10-CM

## 2017-08-03 DIAGNOSIS — Z01419 Encounter for gynecological examination (general) (routine) without abnormal findings: Secondary | ICD-10-CM

## 2017-08-03 DIAGNOSIS — Z1231 Encounter for screening mammogram for malignant neoplasm of breast: Secondary | ICD-10-CM

## 2017-08-03 DIAGNOSIS — N923 Ovulation bleeding: Secondary | ICD-10-CM | POA: Diagnosis not present

## 2017-08-03 NOTE — Patient Instructions (Signed)

## 2017-08-03 NOTE — Progress Notes (Signed)
GYNECOLOGY ANNUAL PHYSICAL EXAM PROGRESS NOTE  Subjective:    Erika Williamson is a 41 y.o. G40P2000 female who presents for an annual exam. The patient has no complaints today. The patient is not currently sexually active (sites that her husband is having some issues with ED over the past several months). The patient wears seatbelts: yes. The patient participates in regular exercise: no. Has the patient ever been transfused or tattooed?: no. The patient reports that there is not domestic violence in her life.   Of note, patient states that since removal of her Mirena IUD, depression has drastically improved, and sex drive has returned.    Gynecologic History  Menarche age: 61 Patient's last menstrual period was 07/09/2017. Contraception: none History of STI's: Denies Last Pap: 10/28/2015. Results were: normal.  Reports h/o abnormal pap smears. Last mammogram: Patient has not had mammogram.   Obstetric History   G2   P2   T2   P0   A0   L0    SAB0   TAB0   Ectopic0   Multiple0   Live Births0     # Outcome Date GA Lbr Len/2nd Weight Sex Delivery Anes PTL Lv  2 Term      CS-LTranv     1 Term      CS-LTranv       Obstetric Comments  1st Menstrual Cycle:  10  1st Pregnancy:  18    Past Medical History:  Diagnosis Date  . 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 Anxiety   . Atypical face pain    Left Sided  . COPD (chronic obstructive pulmonary disease) ([place])   . Depression   . Foot fracture Left X2  . GERD (gastroesophageal reflux disease)   . HPV (human papilloma virus) infection   . Migraine without aura 04/03/2016  . Moderate recurrent major depression ([place]) 07/22/2015  . Shingles   . Sinusitis   . Tension headache   . Tobacco abuse   . Tonsillitis   . Vitamin D deficiency disease     Past Surgical History:  Procedure Laterality Date  . birth control removed  06/09/2017  . CESAREAN SECTION     X2  .  CHOLECYSTECTOMY     Dr Burt Knack  . COLONOSCOPY WITH PROPOFOL N/A 05/03/2015   Procedure: COLONOSCOPY WITH PROPOFOL;  Surgeon: Josefine Class, MD;  Location: [facility] ENDOSCOPY;  Service: Endoscopy;  Laterality: N/A;  . ESOPHAGOGASTRODUODENOSCOPY N/A 05/03/2015   Procedure: ESOPHAGOGASTRODUODENOSCOPY (EGD);  Surgeon: Josefine Class, MD;  Location: [facility] ENDOSCOPY;  Service: Endoscopy;  Laterality: N/A;  . 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 TONSILLECTOMY    . TUBAL LIGATION    . WISDOM TOOTH EXTRACTION      Family History  Problem Relation Age of Onset  . Diabetes Mother   . Hypertension Mother   . COPD Mother   . Stroke Mother   . Osteoporosis Mother   . Depression Mother   . Cancer Father 45       colorectal, spread to lung  . Alcohol abuse Father   . Hypertension Father   . Depression Father   . Diabetes Sister   . COPD Sister   . Anxiety disorder Sister   . Depression Sister   . Fibromyalgia Sister   . Asthma Sister   . ADD / ADHD Son   . ADD / ADHD Daughter  Social History   Social History  . Marital status: Married    Spouse name: N/A  . Number of children: N/A  . Years of education: N/A   Occupational History  . Not on file.   Social History Main Topics  . Smoking status: Current Every Day Smoker    Packs/day: 1.00    Years: 22.00    Types: Cigarettes    Start date: 09/03/1993  . Smokeless tobacco: Never Used  . Alcohol use No  . Drug use: No  . Sexual activity: Yes    Partners: Male    Birth control/ protection: None   Other Topics Concern  . Not on file   Social History Narrative  . No narrative on file    Current Outpatient Prescriptions on File Prior to Visit  Medication Sig Dispense Refill  . baclofen (LIORESAL) 10 MG tablet Take 10 mg by mouth 2 (two) times daily.    . hydrocortisone 2.5 % cream Apply topically as needed.    . lamoTRIgine (LAMICTAL) 200 MG tablet Take 1 tablet (200 mg total) by mouth daily. 90 tablet 1  . PARoxetine  (PAXIL) 40 MG tablet Take 1 tablet (40 mg total) by mouth daily. 90 tablet 1  . rizatriptan (MAXALT) 10 MG tablet 10 mg as needed.     . hydrOXYzine (ATARAX/VISTARIL) 25 MG tablet Take 1 tablet (25 mg total) by mouth every 8 (eight) hours as needed for itching. (Patient not taking: Reported on 08/03/2017) 15 tablet 0   No current facility-administered medications on file prior to visit.     Allergies  Allergen Reactions  . Gabapentin Anaphylaxis  . Ivp Dye [Iodinated Diagnostic Agents]   . Ketorolac Anaphylaxis  . Metrizamide   . Nsaids Anaphylaxis  . Other Anaphylaxis  . Tramadol Anaphylaxis  . Acyclovir And Related   . Erythromycin Other (See Comments)  . Famciclovir   . Levaquin [Levofloxacin In D5w] Other (See Comments)    Muscle aches  . Levofloxacin Other (See Comments)  . Seroquel [Quetiapine Fumarate] Hives  . Tolmetin   . Tramadol Hcl   . Ultram [Tramadol Hcl]   . Compazine [Prochlorperazine Edisylate] Rash and Other (See Comments)    Muscle aches  . Erythromycin Base Rash  . Naprosyn [Naproxen] Itching  . Pristiq [Desvenlafaxine] Rash and Other (See Comments)    Muscle aches      Review of Systems Constitutional: negative for chills, fatigue, fevers and sweats Eyes: negative for irritation, redness and visual disturbance Ears, nose, mouth, throat, and face: negative for hearing loss, nasal congestion, snoring and tinnitus Respiratory: negative for asthma, cough, sputum Cardiovascular: negative for chest pain, dyspnea, exertional chest pressure/discomfort, irregular heart beat, palpitations and syncope Gastrointestinal: negative for abdominal pain, change in bowel habits, nausea and vomiting Genitourinary: positive for abnormal menstrual periods (Notes periods are still q 3-4 weeks, with intermenstrual spotting since IUD removal 2 months ago). Negative for genital lesions, sexual problems and vaginal discharge, dysuria and urinary incontinence Integument/breast:  negative for breast lump, breast tenderness and nipple discharge Hematologic/lymphatic: negative for bleeding and easy bruising Musculoskeletal:negative for back pain and muscle weakness Neurological: negative for dizziness, headaches, vertigo and weakness Endocrine: negative for diabetic symptoms including polydipsia, polyuria and skin dryness Allergic/Immunologic: negative for hay fever and urticaria       Objective:  Blood pressure 123/79, pulse 96, height 5\' 4"  (1.626 m), weight 183 lb 8 oz (83.2 kg), last menstrual period 07/09/2017. Body mass index is 31.5 kg/m.  General  Appearance:    Alert, cooperative, no distress, appears stated age, mildly obesity  Head:    Normocephalic, without obvious abnormality, atraumatic  Eyes:    PERRL, conjunctiva/corneas clear, EOM's intact, both eyes  Ears:    Normal external ear canals, both ears  Nose:   Nares normal, septum midline, mucosa normal, no drainage or sinus tenderness  Throat:   Lips, mucosa, and tongue normal; teeth and gums normal  Neck:   Supple, symmetrical, trachea midline, no adenopathy; thyroid: no enlargement/tenderness/nodules; no carotid bruit or JVD  Back:     Symmetric, no curvature, ROM normal, no CVA tenderness  Lungs:     Clear to auscultation bilaterally, respirations unlabored  Chest Wall:    No tenderness or deformity   Heart:    Regular rate and rhythm, S1 and S2 normal, no murmur, rub or gallop  Breast Exam:    No tenderness, masses, or nipple abnormality  Abdomen:     Soft, non-tender, bowel sounds active all four quadrants, no masses, no organomegaly.    Genitalia:    Pelvic:external genitalia normal, vagina without lesions, discharge, or tenderness, rectovaginal septum  normal. Cervix normal in appearance, no cervical motion tenderness, no adnexal masses or tenderness.  Uterus normal size, shape, mobile, regular contours, nontender.  Rectal:    Normal external sphincter.  No hemorrhoids appreciated. Internal exam  not done.   Extremities:   Extremities normal, atraumatic, no cyanosis or edema  Pulses:   2+ and symmetric all extremities  Skin:   Skin color, texture, turgor normal, no rashes or lesions  Lymph nodes:   Cervical, supraclavicular, and axillary nodes normal  Neurologic:   CNII-XII intact, normal strength, sensation and reflexes throughout    Labs:  Lab Results  Component Value Date   WBC 12.5 (H) 05/22/2017   HGB 15.1 05/22/2017   HCT 43.3 05/22/2017   MCV 91.9 05/22/2017   PLT 379 05/22/2017    Lab Results  Component Value Date   CREATININE 0.66 05/22/2017   BUN 8 05/22/2017   NA 132 (L) 05/22/2017   K 3.9 05/22/2017   CL 99 (L) 05/22/2017   CO2 24 05/22/2017    Lab Results  Component Value Date   ALT 17 05/22/2017   AST 22 05/22/2017   ALKPHOS 73 05/22/2017   BILITOT 0.5 05/22/2017    Lab Results  Component Value Date   TSH 1.710 07/22/2015    Assessment:   Healthy female exam.  Intermenstrual spotting   Plan:     Blood tests: lipid profile Contraception: none.  Declines further hormonal interventions due to mood changes.  Discussed healthy lifestyle modifications. Mammogram ordered. Pap smear up to date.   Patient with intermenstrual spotting s/p IUD removal.  If no resolution over the next few months, advised patient to return for further evaluation (endometrial biopsy).  Patient notes she may be considering an endometrial ablation in the future.  Declines flu vaccine.  Follow up in 1 year.     Rubie Maid, MD [facility]

## 2017-08-09 LAB — IGP, COBASHPV16/18
HPV 16: NEGATIVE
HPV 18: NEGATIVE
HPV OTHER HR TYPES: NEGATIVE
PAP SMEAR COMMENT: 0

## 2017-08-10 ENCOUNTER — Telehealth: Payer: Self-pay

## 2017-08-10 NOTE — Telephone Encounter (Signed)
-----   Message from Harlin Heys, MD sent at 08/10/2017  9:28 AM EDT ----- Negative Pap and HPV

## 2017-08-10 NOTE — Telephone Encounter (Signed)
Message left on pts voicemail- neg results per provider. 

## 2017-08-17 ENCOUNTER — Ambulatory Visit (INDEPENDENT_AMBULATORY_CARE_PROVIDER_SITE_OTHER): Payer: BLUE CROSS/BLUE SHIELD | Admitting: Obstetrics and Gynecology

## 2017-08-17 ENCOUNTER — Encounter: Payer: Self-pay | Admitting: Obstetrics and Gynecology

## 2017-08-17 VITALS — BP 115/77 | HR 89 | Ht 64.0 in | Wt 186.0 lb

## 2017-08-17 DIAGNOSIS — N938 Other specified abnormal uterine and vaginal bleeding: Secondary | ICD-10-CM | POA: Diagnosis not present

## 2017-08-17 NOTE — Patient Instructions (Addendum)
Endometrial Ablation Endometrial ablation is a procedure that destroys the thin inner layer of the lining of the uterus (endometrium). This procedure may be done:  To stop heavy periods.  To stop bleeding that is causing anemia.  To control irregular bleeding.  To treat bleeding caused by small tumors (fibroids) in the endometrium.  This procedure is often an alternative to major surgery, such as removal of the uterus and cervix (hysterectomy). As a result of this procedure:  You may not be able to have children. However, if you are premenopausal (you have not gone through menopause): ? You may still have a small chance of getting pregnant. ? You will need to use a reliable method of birth control after the procedure to prevent pregnancy.  You may stop having a menstrual period, or you may have only a small amount of bleeding during your period. Menstruation may return several years after the procedure.  Tell a health care provider about:  Any allergies you have.  All medicines you are taking, including vitamins, herbs, eye drops, creams, and over-the-counter medicines.  Any problems you or family members have had with the use of anesthetic medicines.  Any blood disorders you have.  Any surgeries you have had.  Any medical conditions you have. What are the risks? Generally, this is a safe procedure. However, problems may occur, including:  A hole (perforation) in the uterus or bowel.  Infection of the uterus, bladder, or vagina.  Bleeding.  Damage to other structures or organs.  An air bubble in the lung (air embolus).  Problems with pregnancy after the procedure.  Failure of the procedure.  Decreased ability to diagnose cancer in the endometrium.  What happens before the procedure?  You will have tests of your endometrium to make sure there are no pre-cancerous cells or cancer cells present.  You may have an ultrasound of the uterus.  You may be given  medicines to thin the endometrium.  Ask your health care provider about: ? Changing or stopping your regular medicines. This is especially important if you take diabetes medicines or blood thinners. ? Taking medicines such as aspirin and ibuprofen. These medicines can thin your blood. Do not take these medicines before your procedure if your doctor tells you not to.  Plan to have someone take you home from the hospital or clinic. What happens during the procedure?  You will lie on an exam table with your feet and legs supported as in a pelvic exam.  To lower your risk of infection: ? Your health care team will wash or sanitize their hands and put on germ-free (sterile) gloves. ? Your genital area will be washed with soap.  An IV tube will be inserted into one of your veins.  You will be given a medicine to help you relax (sedative).  A surgical instrument with a light and camera (resectoscope) will be inserted into your vagina and moved into your uterus. This allows your surgeon to see inside your uterus.  Endometrial tissue will be removed using one of the following methods: ? Radiofrequency. This method uses a radiofrequency-alternating electric current to remove the endometrium. ? Cryotherapy. This method uses extreme cold to freeze the endometrium. ? Heated-free liquid. This method uses a heated saltwater (saline) solution to remove the endometrium. ? Microwave. This method uses high-energy microwaves to heat up the endometrium and remove it. ? Thermal balloon. This method involves inserting a catheter with a balloon tip into the uterus. The balloon tip is   filled with heated fluid to remove the endometrium. The procedure may vary among health care providers and hospitals. What happens after the procedure?  Your blood pressure, heart rate, breathing rate, and blood oxygen level will be monitored until the medicines you were given have worn off.  As tissue healing occurs, you may  notice vaginal bleeding for 4-6 weeks after the procedure. You may also experience: ? Cramps. ? Thin, watery vaginal discharge that is light pink or brown in color. ? A need to urinate more frequently than usual. ? Nausea.  Do not drive for 24 hours if you were given a sedative.  Do not have sex or insert anything into your vagina until your health care provider approves. Summary  Endometrial ablation is done to treat the many causes of heavy menstrual bleeding.  The procedure may be done only after medications have been tried to control the bleeding.  Plan to have someone take you home from the hospital or clinic. This information is not intended to replace advice given to you by your health care provider. Make sure you discuss any questions you have with your health care provider. Document Released: 08/14/2004 Document Revised: 10/22/2016 Document Reviewed: 10/22/2016 Elsevier Interactive Patient Education  2017 [employer]  

## 2017-08-17 NOTE — Progress Notes (Signed)
    GYNECOLOGY PROGRESS NOTE  Subjective:    Patient ID: Erika Williamson, female    DOB: 01-26-1976, 41 y.o.   MRN: [id number]  HPI  Patient is a 41 y.o. G44P2000 female who presents for complaints of heavy vaginal bleeding.  Had episode of bleeding on 08/05/17 lasted 2 days, with 2nd day being extremely heavy soaking through double pads, towel, and on to the floor.  Could not go to work. Of note, patient had her Mirena IUD removed ~ 2 weeks ago.   The following portions of the patient's history were reviewed and updated as appropriate: allergies, current medications, past family history, past medical history, past social history, past surgical history and problem list.    Review of Systems Pertinent items noted in HPI and remainder of comprehensive ROS otherwise negative.   Objective:   Blood pressure 115/77, pulse 89, height 5\' 4"  (1.626 m), weight 186 lb (84.4 kg), last menstrual period 08/06/2017. General appearance: alert and no distress Abdomen: soft, non-tender; bowel sounds normal; no masses,  no organomegaly Pelvic: external genitalia normal, rectovaginal septum normal.  Vagina without discharge no blood in vaginal vault.  Cervix normal appearing, no lesions and no motion tenderness.  Uterus mobile, nontender, normal shape and size.  Adnexae non-palpable, nontender bilaterally.  Extremities: extremities normal, atraumatic, no cyanosis or edema Neurologic: Grossly normal   Assessment:   Dysfunctional uterine bleeding  Plan:   - Patient notes that she is now ready to proceed with surgical management of endometrial ablation. Notes that she was not expecting the worsening of her periods after her IUD removal. Desires surgery to be scheduled ASAP, 11/6 is preference due to work schedule.    - The risks of surgery were discussed in detail with the patient including but not limited to: bleeding which may require transfusion or reoperation; infection which may require prolonged  hospitalization or re-hospitalization and antibiotic therapy; injury to bowel, bladder, ureters and major vessels or other surrounding organs; need for additional procedures including laparotomy; thromboembolic phenomenon, incisional problems and other postoperative or anesthesia complications.  Patient was told that the likelihood that her condition and symptoms will be treated effectively with this surgical management was very high; the postoperative expectations were also discussed in detail. The patient also understands the alternative treatment options which were discussed in full. All questions were answered.  She was told that she will be contacted by our surgical scheduler regarding the time and date of her surgery; routine preoperative instructions of having nothing to eat or drink after midnight on the day prior to surgery and also coming to the hospital 1.5 hours prior to her time of surgery were also emphasized.  She was told she may be called for a preoperative appointment about a week prior to surgery and will be given further preoperative instructions at that visit. Printed patient education handouts about the procedure were given to the patient to review at home.   A total of 15 minutes were spent face-to-face with the patient during this encounter and over half of that time involved counseling and coordination of care.   Erika Maid, MD [facility]

## 2017-08-20 ENCOUNTER — Encounter
Admission: RE | Admit: 2017-08-20 | Discharge: 2017-08-20 | Disposition: A | Discharge status: Home / Self Care | Payer: BLUE CROSS/BLUE SHIELD | Source: Ambulatory Visit | Attending: Obstetrics and Gynecology | Admitting: Obstetrics and Gynecology

## 2017-08-20 DIAGNOSIS — D649 Anemia, unspecified: Secondary | ICD-10-CM | POA: Diagnosis not present

## 2017-08-20 DIAGNOSIS — N938 Other specified abnormal uterine and vaginal bleeding: Secondary | ICD-10-CM | POA: Insufficient documentation

## 2017-08-20 DIAGNOSIS — Z01818 Encounter for other preprocedural examination: Secondary | ICD-10-CM | POA: Diagnosis present

## 2017-08-20 HISTORY — DX: Systemic lupus erythematosus, unspecified: M32.9

## 2017-08-20 HISTORY — DX: Reserved for concepts with insufficient information to code with codable children: IMO0002

## 2017-08-20 LAB — CBC
HCT: 41.6 % (ref 35.0–47.0)
Hemoglobin: 14.5 g/dL (ref 12.0–16.0)
MCH: 32.4 pg (ref 26.0–34.0)
MCHC: 34.9 g/dL (ref 32.0–36.0)
MCV: 92.8 fL (ref 80.0–100.0)
Platelets: 361 10*3/uL (ref 150–440)
RBC: 4.49 MIL/uL (ref 3.80–5.20)
RDW: 13.6 % (ref 11.5–14.5)
WBC: 10.3 10*3/uL (ref 3.6–11.0)

## 2017-08-20 LAB — BASIC METABOLIC PANEL
ANION GAP: 7 (ref 5–15)
BUN: 5 mg/dL — AB (ref 6–20)
CHLORIDE: 101 mmol/L (ref 101–111)
CO2: 28 mmol/L (ref 22–32)
Calcium: 9.1 mg/dL (ref 8.9–10.3)
Creatinine, Ser: 0.55 mg/dL (ref 0.44–1.00)
Glucose, Bld: 89 mg/dL (ref 65–99)
POTASSIUM: 3.8 mmol/L (ref 3.5–5.1)
SODIUM: 136 mmol/L (ref 135–145)

## 2017-08-20 NOTE — H&P (Signed)
@[place] @   GYNECOLOGY PREOPERATIVE HISTORY AND PHYSICAL   Subjective:  Erika Williamson is a 41 y.o. G2P2000 here for surgical management of dysfunctional uterine bleeding.  No significant preoperative concerns.  Proposed surgery: Hysteroscopy D&C with Novasure endometrial ablation  Pertinent Gynecological History: Menses: flow is excessive with use of 5-8 pads or tampons on heaviest days and regular every 21 days without intermenstrual spotting Bleeding: dysfunctional uterine bleeding Contraception: none Last mammogram: Patient has never had a mammogram.  Last pap: normal Date: 08/03/2017. Has h/o abnormal pap smears, and h/o LEEP.    Past Medical History:  Diagnosis Date  . 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 Anxiety   . Atypical face pain    Left Sided  . COPD (chronic obstructive pulmonary disease) ([place])    "Patient denies"  . Depression   . Foot fracture Left X2  . GERD (gastroesophageal reflux disease)   . HPV (human papilloma virus) infection   . Lupus   . Migraine without aura 04/03/2016  . Moderate recurrent major depression ([place]) 07/22/2015  . Shingles   . Sinusitis   . Tension headache   . Tobacco abuse   . Tonsillitis   . Vitamin D deficiency disease     Past Surgical History:  Procedure Laterality Date  . birth control removed  06/09/2017  . CESAREAN SECTION     X2  . CESAREAN SECTION    . CHOLECYSTECTOMY     Dr Burt Knack  . COLONOSCOPY WITH PROPOFOL N/A 05/03/2015   Procedure: COLONOSCOPY WITH PROPOFOL;  Surgeon: Josefine Class, MD;  Location: [facility] ENDOSCOPY;  Service: Endoscopy;  Laterality: N/A;  . ESOPHAGOGASTRODUODENOSCOPY N/A 05/03/2015   Procedure: ESOPHAGOGASTRODUODENOSCOPY (EGD);  Surgeon: Josefine Class, MD;  Location: [facility] ENDOSCOPY;  Service: Endoscopy;  Laterality: N/A;  . 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 TONSILLECTOMY    . TUBAL LIGATION    . WISDOM TOOTH  EXTRACTION      OB History  Gravida Para Term Preterm AB Living  2 2 2         SAB TAB Ectopic Multiple Live Births               # Outcome Date GA Lbr Len/2nd Weight Sex Delivery Anes PTL Lv  2 Term      CS-LTranv     1 Term      CS-LTranv       Obstetric Comments  1st Menstrual Cycle:  10  1st Pregnancy:  57    Family History  Problem Relation Age of Onset  . Diabetes Mother   . Hypertension Mother   . COPD Mother   . Stroke Mother   . Osteoporosis Mother   . Depression Mother   . Cancer Father 45       colorectal, spread to lung  . Alcohol abuse Father   . Hypertension Father   . Depression Father   . Diabetes Sister   . COPD Sister   . Anxiety disorder Sister   . Depression Sister   . Fibromyalgia Sister   . Asthma Sister   . ADD / ADHD Son   . ADD / ADHD Daughter     Social History   Social History  . Marital status: Married    Spouse name: N/A  . Number of children: N/A  . Years of education: N/A   Occupational History  . Not on file.  Social History Main Topics  . Smoking status: Current Every Day Smoker    Packs/day: 1.00    Years: 22.00    Types: Cigarettes    Start date: 09/03/1993  . Smokeless tobacco: Never Used  . Alcohol use No  . Drug use: No  . Sexual activity: Yes    Partners: Male    Birth control/ protection: None   Other Topics Concern  . Not on file   Social History Narrative  . No narrative on file    Current Outpatient Prescriptions on File Prior to Visit  Medication Sig Dispense Refill  . baclofen (LIORESAL) 10 MG tablet Take 10 mg by mouth 2 (two) times daily.    . hydrocortisone 2.5 % cream Apply 1 application topically as needed (rash). On face    . lamoTRIgine (LAMICTAL) 200 MG tablet Take 1 tablet (200 mg total) by mouth daily. 90 tablet 1  . PARoxetine (PAXIL) 40 MG tablet Take 1 tablet (40 mg total) by mouth daily. 90 tablet 1  . rizatriptan (MAXALT) 10 MG tablet Take 10 mg by mouth as needed for migraine.      . hydrOXYzine (ATARAX/VISTARIL) 25 MG tablet Take 1 tablet (25 mg total) by mouth every 8 (eight) hours as needed for itching. (Patient not taking: Reported on 08/03/2017) 15 tablet 0   No current facility-administered medications on file prior to visit.     Allergies  Allergen Reactions  . Gabapentin Anaphylaxis  . Ivp Dye [Iodinated Diagnostic Agents] Anaphylaxis  . Ketorolac Anaphylaxis  . Metrizamide Anaphylaxis  . Naprosyn [Naproxen] Anaphylaxis  . Nsaids Anaphylaxis  . Tolmetin Anaphylaxis  . Tramadol Anaphylaxis  . Erythromycin     Flu like symptoms  . Famciclovir     unknown  . Levofloxacin Other (See Comments)    Muscle aches and contractions   . Seroquel [Quetiapine Fumarate] Hives  . Compazine [Prochlorperazine Edisylate] Rash and Other (See Comments)    Muscle aches  . Pristiq [Desvenlafaxine] Rash and Other (See Comments)    Muscle aches     Review of Systems Constitutional: No recent fever/chills/sweats Respiratory: No recent cough/bronchitis Cardiovascular: No chest pain Gastrointestinal: No recent nausea/vomiting/diarrhea Genitourinary: No UTI symptoms.  Positive for heavy menstrual bleeding Hematologic/lymphatic:No history of coagulopathy or recent blood thinner use    Objective:   Blood pressure 115/77, pulse 89, height 5\' 4"  (1.626 m), weight 186 lb (84.4 kg), last menstrual period 08/06/2017. CONSTITUTIONAL: Well-developed, well-nourished female in no acute distress.  HENT:  Normocephalic, atraumatic, External right and left ear normal. Oropharynx is clear and moist EYES: Conjunctivae and EOM are normal. Pupils are equal, round, and reactive to light. No scleral icterus.  NECK: Normal range of motion, supple, no masses SKIN: Skin is warm and dry. No rash noted. Not diaphoretic. No erythema. No pallor. NEUROLOGIC: Alert and oriented to person, place, and time. Normal reflexes, muscle tone coordination. No cranial nerve deficit noted. PSYCHIATRIC:  Normal mood and affect. Normal behavior. Normal judgment and thought content. CARDIOVASCULAR: Normal heart rate noted, regular rhythm RESPIRATORY: Effort and breath sounds normal, no problems with respiration noted ABDOMEN: Soft, nontender, nondistended. PELVIC: Deferred MUSCULOSKELETAL: Normal range of motion. No edema and no tenderness. 2+ distal pulses.    Labs: Results for orders placed or performed during the hospital encounter of 08/20/17 (from the past 336 hour(s))  Basic metabolic panel   Collection Time: 08/20/17 10:36 AM  Result Value Ref Range   Sodium 136 135 - 145 mmol/L   Potassium  3.8 3.5 - 5.1 mmol/L   Chloride 101 101 - 111 mmol/L   CO2 28 22 - 32 mmol/L   Glucose, Bld 89 65 - 99 mg/dL   BUN 5 (L) 6 - 20 mg/dL   Creatinine, Ser 0.55 0.44 - 1.00 mg/dL   Calcium 9.1 8.9 - 10.3 mg/dL   GFR calc non Af Amer >60 >60 mL/min   GFR calc Af Amer >60 >60 mL/min   Anion gap 7 5 - 15  CBC   Collection Time: 08/20/17 10:36 AM  Result Value Ref Range   WBC 10.3 3.6 - 11.0 K/uL   RBC 4.49 3.80 - 5.20 MIL/uL   Hemoglobin 14.5 12.0 - 16.0 g/dL   HCT 41.6 35.0 - 47.0 %   MCV 92.8 80.0 - 100.0 fL   MCH 32.4 26.0 - 34.0 pg   MCHC 34.9 32.0 - 36.0 g/dL   RDW 13.6 11.5 - 14.5 %   Platelets 361 150 - 440 K/uL     Imaging Studies: No results found.  Assessment:    Dysfunctional uterine bleeding   Plan:    Counseling: Procedure, risks, reasons, benefits and complications (including injury to bowel, bladder, major blood vessel, ureter, bleeding, possibility of transfusion, infection, or fistula formation) reviewed in detail. Likelihood of success in alleviating the patient's condition was discussed. Routine postoperative instructions will be reviewed with the patient and her family in detail after surgery.  The patient concurred with the proposed plan, giving informed written consent for the surgery.   Preop testing ordered. Instructions reviewed, including NPO after  midnight.   Rubie Maid, MD [facility]

## 2017-08-20 NOTE — Patient Instructions (Signed)
Your procedure is scheduled on: 08/24/17 Tues Report to Same Day Surgery 2nd floor medical mall [facility] Entrance-take elevator on left to 2nd floor.  Check in with surgery information desk.) To find out your arrival time please call [phone] between 1PM - 3PM on 08/23/17 Mon  Remember: Instructions that are not followed completely may result in serious medical risk, up to and including death, or upon the discretion of your surgeon and anesthesiologist your surgery may need to be rescheduled.    _x___ 1. Do not eat food after midnight the night before your procedure. You may drink clear liquids up to 2 hours before you are scheduled to arrive at the hospital for your procedure.  Do not drink clear liquids within 2 hours of your scheduled arrival to the hospital.  Clear liquids include  --Water or Apple juice without pulp  --Clear carbohydrate beverage such as ClearFast or Gatorade  --Black Coffee or Clear Tea (No milk, no creamers, do not add anything to                  the coffee or Tea Type 1 and type 2 diabetics should only drink water.  No gum chewing or hard candies.     __x__ 2. No Alcohol for 24 hours before or after surgery.   __x__3. No Smoking for 24 prior to surgery.   ____  4. Bring all medications with you on the day of surgery if instructed.    __x__ 5. Notify your doctor if there is any change in your medical condition     (cold, fever, infections).     Do not wear jewelry, make-up, hairpins, clips or nail polish.  Do not wear lotions, powders, or perfumes. You may wear deodorant.  Do not shave 48 hours prior to surgery. Men may shave face and neck.  Do not bring valuables to the hospital.    [facility] is not responsible for any belongings or valuables.               Contacts, dentures or bridgework may not be worn into surgery.  Leave your suitcase in the car. After surgery it may be brought to your room.  For patients admitted to the hospital, discharge  time is determined by your                       treatment team.   Patients discharged the day of surgery will not be allowed to drive home.  You will need someone to drive you home and stay with you the night of your procedure.    Please read over the following fact sheets that you were given:   [facility] Preparing for Surgery and or MRSA Information   _x___ Take anti-hypertensive listed below, cardiac, seizure, asthma,     anti-reflux and psychiatric medicines. These include:  1. lamoTRIgine (LAMICTAL) 200 MG tablet  2.PARoxetine (PAXIL) 40 MG tablet  3.  4.  5.  6.  ____Fleets enema or Magnesium Citrate as directed.   ____ Use CHG Soap or sage wipes as directed on instruction sheet   ____ Use inhalers on the day of surgery and bring to hospital day of surgery  ____ Stop Metformin and Janumet 2 days prior to surgery.    ____ Take 1/2 of usual insulin dose the night before surgery and none on the morning     surgery.   _x___ Follow recommendations from Cardiologist, Pulmonologist or PCP regarding  stopping Aspirin, Coumadin, Plavix ,Eliquis, Effient, or Pradaxa, and Pletal.  X____Stop Anti-inflammatories such as Advil, Aleve, Ibuprofen, Motrin, Naproxen, Naprosyn, Goodies powders or aspirin products. OK to take Tylenol and                          Celebrex.   _x___ Stop supplements until after surgery.  But may continue Vitamin D, Vitamin B,       and multivitamin.   ____ Bring C-Pap to the hospital.

## 2017-08-24 ENCOUNTER — Encounter
Admission: RE | Disposition: A | Discharge status: Home / Self Care | Payer: Self-pay | Source: Ambulatory Visit | Attending: Obstetrics and Gynecology

## 2017-08-24 ENCOUNTER — Ambulatory Visit: Payer: BLUE CROSS/BLUE SHIELD | Admitting: Anesthesiology

## 2017-08-24 ENCOUNTER — Ambulatory Visit
Admission: RE | Admit: 2017-08-24 | Discharge: 2017-08-24 | Disposition: A | Discharge status: Home / Self Care | Payer: BLUE CROSS/BLUE SHIELD | Source: Ambulatory Visit | Attending: Obstetrics and Gynecology | Admitting: Obstetrics and Gynecology

## 2017-08-24 ENCOUNTER — Encounter: Payer: Self-pay | Admitting: *Deleted

## 2017-08-24 DIAGNOSIS — M329 Systemic lupus erythematosus, unspecified: Secondary | ICD-10-CM | POA: Diagnosis not present

## 2017-08-24 DIAGNOSIS — N840 Polyp of corpus uteri: Secondary | ICD-10-CM | POA: Insufficient documentation

## 2017-08-24 DIAGNOSIS — G43909 Migraine, unspecified, not intractable, without status migrainosus: Secondary | ICD-10-CM | POA: Insufficient documentation

## 2017-08-24 DIAGNOSIS — Z79899 Other long term (current) drug therapy: Secondary | ICD-10-CM | POA: Diagnosis not present

## 2017-08-24 DIAGNOSIS — F17210 Nicotine dependence, cigarettes, uncomplicated: Secondary | ICD-10-CM | POA: Insufficient documentation

## 2017-08-24 DIAGNOSIS — N938 Other specified abnormal uterine and vaginal bleeding: Secondary | ICD-10-CM | POA: Insufficient documentation

## 2017-08-24 DIAGNOSIS — F419 Anxiety disorder, unspecified: Secondary | ICD-10-CM | POA: Insufficient documentation

## 2017-08-24 DIAGNOSIS — F339 Major depressive disorder, recurrent, unspecified: Secondary | ICD-10-CM | POA: Diagnosis not present

## 2017-08-24 HISTORY — PX: DILITATION & CURRETTAGE/HYSTROSCOPY WITH NOVASURE ABLATION: SHX5568

## 2017-08-24 LAB — POCT PREGNANCY, URINE: PREG TEST UR: NEGATIVE

## 2017-08-24 SURGERY — DILATATION & CURETTAGE/HYSTEROSCOPY WITH NOVASURE ABLATION
Anesthesia: General | Site: Vagina | Wound class: Clean Contaminated

## 2017-08-24 MED ORDER — LACTATED RINGERS IV SOLN: INTRAVENOUS | Status: DC

## 2017-08-24 MED ORDER — ONDANSETRON HCL 4 MG/2ML IJ SOLN
INTRAMUSCULAR | Status: DC | PRN
  Administered 2017-08-24: 4 mg via INTRAVENOUS

## 2017-08-24 MED ORDER — FENTANYL CITRATE (PF) 100 MCG/2ML IJ SOLN
INTRAMUSCULAR | Status: DC | PRN
  Administered 2017-08-24 (×4): 25 ug via INTRAVENOUS

## 2017-08-24 MED ORDER — FENTANYL CITRATE (PF) 100 MCG/2ML IJ SOLN
INTRAMUSCULAR | Status: AC
  Filled 2017-08-24: qty 2

## 2017-08-24 MED ORDER — MIDAZOLAM HCL 2 MG/2ML IJ SOLN
INTRAMUSCULAR | Status: AC
  Filled 2017-08-24: qty 2

## 2017-08-24 MED ORDER — ACETAMINOPHEN 500 MG PO TABS
1000.0000 mg | ORAL_TABLET | Freq: Once | ORAL | Status: AC
  Administered 2017-08-24: 1000 mg via ORAL
  Filled 2017-08-24: qty 2

## 2017-08-24 MED ORDER — DEXAMETHASONE SODIUM PHOSPHATE 10 MG/ML IJ SOLN
INTRAMUSCULAR | Status: DC | PRN
  Administered 2017-08-24: 10 mg via INTRAVENOUS

## 2017-08-24 MED ORDER — PROPOFOL 10 MG/ML IV BOLUS
INTRAVENOUS | Status: AC
  Filled 2017-08-24: qty 20

## 2017-08-24 MED ORDER — DEXAMETHASONE SODIUM PHOSPHATE 10 MG/ML IJ SOLN
INTRAMUSCULAR | Status: AC
  Filled 2017-08-24: qty 1

## 2017-08-24 MED ORDER — ONDANSETRON HCL 4 MG/2ML IJ SOLN
INTRAMUSCULAR | Status: AC
  Filled 2017-08-24: qty 2

## 2017-08-24 MED ORDER — ACETAMINOPHEN 500 MG PO TABS
ORAL_TABLET | ORAL | Status: AC
  Administered 2017-08-24: 1000 mg via ORAL
  Filled 2017-08-24: qty 1

## 2017-08-24 MED ORDER — LACTATED RINGERS IV SOLN
INTRAVENOUS | Status: DC
  Administered 2017-08-24: 07:00:00 via INTRAVENOUS

## 2017-08-24 MED ORDER — FENTANYL CITRATE (PF) 100 MCG/2ML IJ SOLN: 25.0000 ug | INTRAMUSCULAR | Status: DC | PRN

## 2017-08-24 MED ORDER — FAMOTIDINE 20 MG PO TABS
20.0000 mg | ORAL_TABLET | Freq: Once | ORAL | Status: AC
Start: 2017-08-24 — End: 2017-08-24
  Administered 2017-08-24: 20 mg via ORAL

## 2017-08-24 MED ORDER — ACETAMINOPHEN 500 MG PO TABS
ORAL_TABLET | ORAL | Status: AC
  Filled 2017-08-24: qty 1

## 2017-08-24 MED ORDER — OXYCODONE HCL 5 MG PO TABS: 5.0000 mg | ORAL_TABLET | Freq: Once | ORAL | Status: DC | PRN

## 2017-08-24 MED ORDER — LIDOCAINE HCL (CARDIAC) 20 MG/ML IV SOLN
INTRAVENOUS | Status: DC | PRN
  Administered 2017-08-24: 80 mg via INTRAVENOUS

## 2017-08-24 MED ORDER — LIDOCAINE HCL (PF) 2 % IJ SOLN
INTRAMUSCULAR | Status: AC
  Filled 2017-08-24: qty 10

## 2017-08-24 MED ORDER — MIDAZOLAM HCL 2 MG/2ML IJ SOLN
INTRAMUSCULAR | Status: DC | PRN
  Administered 2017-08-24: 2 mg via INTRAVENOUS

## 2017-08-24 MED ORDER — OXYCODONE HCL 5 MG PO TABS: 5.0000 mg | ORAL_TABLET | Freq: Four times a day (QID) | ORAL | 0 refills | Status: DC | PRN

## 2017-08-24 MED ORDER — PROPOFOL 10 MG/ML IV BOLUS
INTRAVENOUS | Status: DC | PRN
  Administered 2017-08-24: 200 mg via INTRAVENOUS

## 2017-08-24 MED ORDER — OXYCODONE HCL 5 MG/5ML PO SOLN: 5.0000 mg | Freq: Once | ORAL | Status: DC | PRN

## 2017-08-24 SURGICAL SUPPLY — 18 items
CANISTER SUCT 1200ML W/VALVE (MISCELLANEOUS) ×3 IMPLANT
CATH ROBINSON RED A/P 16FR (CATHETERS) ×3 IMPLANT
DRSG TELFA 3X8 NADH (GAUZE/BANDAGES/DRESSINGS) ×3 IMPLANT
GLOVE BIO SURGEON STRL SZ 6.5 (GLOVE) ×2 IMPLANT
GLOVE BIO SURGEONS STRL SZ 6.5 (GLOVE) ×1
GLOVE INDICATOR 7.0 STRL GRN (GLOVE) ×3 IMPLANT
GOWN STRL REUS W/ TWL LRG LVL3 (GOWN DISPOSABLE) ×2 IMPLANT
GOWN STRL REUS W/TWL LRG LVL3 (GOWN DISPOSABLE) ×4
IV LACTATED RINGERS 1000ML (IV SOLUTION) ×3 IMPLANT
KIT RM TURNOVER CYSTO AR (KITS) ×3 IMPLANT
NOVASURE ENDOMETRIAL ABLATION (MISCELLANEOUS) ×3 IMPLANT
NS IRRIG 500ML POUR BTL (IV SOLUTION) ×3 IMPLANT
PACK DNC HYST (MISCELLANEOUS) ×3 IMPLANT
PAD OB MATERNITY 4.3X12.25 (PERSONAL CARE ITEMS) ×3 IMPLANT
SPONGE XRAY 4X4 16PLY STRL (MISCELLANEOUS) ×3 IMPLANT
TOWEL OR 17X26 4PK STRL BLUE (TOWEL DISPOSABLE) ×3 IMPLANT
TUBING CONNECTING 10 (TUBING) ×2 IMPLANT
TUBING CONNECTING 10' (TUBING) ×1

## 2017-08-24 NOTE — Anesthesia Postprocedure Evaluation (Signed)
Anesthesia Post Note  Patient: Ellenie B Affinito  Procedure(s) Performed: DILATATION & CURETTAGE/HYSTEROSCOPY WITH NOVASURE ABLATION (N/A Vagina )  Patient location during evaluation: PACU Anesthesia Type: General Level of consciousness: awake and alert Pain management: pain level controlled Vital Signs Assessment: post-procedure vital signs reviewed and stable Respiratory status: spontaneous breathing, nonlabored ventilation, respiratory function stable and patient connected to nasal cannula oxygen Cardiovascular status: blood pressure returned to baseline and stable Postop Assessment: no apparent nausea or vomiting Anesthetic complications: no     Last Vitals:  Vitals:   08/24/17 0945 08/24/17 0958  BP: 129/75 128/73  Pulse: 72 68  Resp: 17 16  Temp:  36.9 C  SpO2: 98% 95%    Last Pain:  Vitals:   08/24/17 0958  TempSrc: Oral  PainSc: 1                  Precious Haws Jade Burright

## 2017-08-24 NOTE — Progress Notes (Signed)
Tylenol 1000 mg given for pain   Does not want anything stronger

## 2017-08-24 NOTE — Anesthesia Post-op Follow-up Note (Signed)
Anesthesia QCDR form completed.        

## 2017-08-24 NOTE — Discharge Instructions (Signed)
Hysteroscopy, Care After °Refer to this sheet in the next few weeks. These instructions provide you with information on caring for yourself after your procedure. Your health care provider may also give you more specific instructions. Your treatment has been planned according to current medical practices, but problems sometimes occur. Call your health care provider if you have any problems or questions after your procedure. °What can I expect after the procedure? °After your procedure, it is typical to have the following: °· You may have some cramping. This normally lasts for a couple days. °· You may have bleeding. This can vary from light spotting for a few days to menstrual-like bleeding for 3-7 days. ° °Follow these instructions at home: °· Rest for the first 1-2 days after the procedure. °· Only take over-the-counter or prescription medicines as directed by your health care provider. Do not take aspirin. It can increase the chances of bleeding. °· Take showers instead of baths for 2 weeks or as directed by your health care provider. °· Do not drive for 24 hours or as directed. °· Do not drink alcohol while taking pain medicine. °· Do not use tampons, douche, or have sexual intercourse for 2 weeks or until your health care provider says it is okay. °· Take your temperature twice a day for 4-5 days. Write it down each time. °· Follow your health care provider's advice about diet, exercise, and lifting. °· If you develop constipation, you may: °? Take a mild laxative if your health care provider approves. °? Add bran foods to your diet. °? Drink enough fluids to keep your urine clear or pale yellow. °· Try to have someone with you or available to you for the first 24-48 hours, especially if you were given a general anesthetic. °· Follow up with your health care provider as directed. °Contact a health care provider if: °· You feel dizzy or lightheaded. °· You feel sick to your stomach (nauseous). °· You have  abnormal vaginal discharge. °· You have a rash. °· You have pain that is not controlled with medicine. °Get help right away if: °· You have bleeding that is heavier than a normal menstrual period. °· You have a fever. °· You have increasing cramps or pain, not controlled with medicine. °· You have new belly (abdominal) pain. °· You pass out. °· You have pain in the tops of your shoulders (shoulder strap areas). °· You have shortness of breath. °This information is not intended to replace advice given to you by your health care provider. Make sure you discuss any questions you have with your health care provider. °Document Released: 07/26/2013 Document Revised: 03/12/2016 Document Reviewed: 05/04/2013 °Elsevier Interactive Patient Education © 2017 [employer] ° ° ° °AMBULATORY SURGERY  °DISCHARGE INSTRUCTIONS ° ° °1) The drugs that you were given will stay in your system until tomorrow so for the next 24 hours you should not: ° °A) Drive an automobile °B) Make any legal decisions °C) Drink any alcoholic beverage ° ° °2) You may resume regular meals tomorrow.  Today it is better to start with liquids and gradually work up to solid foods. ° °You may eat anything you prefer, but it is better to start with liquids, then soup and crackers, and gradually work up to solid foods. ° ° °3) Please notify your doctor immediately if you have any unusual bleeding, trouble breathing, redness and pain at the surgery site, drainage, fever, or pain not relieved by medication. ° ° ° °4) Additional   Additional Instructions: ° ° ° ° ° ° ° °Please contact your physician with any problems or Same Day Surgery at [phone], Monday through Friday 6 am to 4 pm, or [place] at [place] Main number at [phone]. ° ° ° ° °

## 2017-08-24 NOTE — Op Note (Signed)
Procedure(s): DILATATION & CURETTAGE/HYSTEROSCOPY  Procedure Note  Erika Williamson female 41 y.o. 08/24/2017  Indications: The patient is a 41 y.o. G39P2000 female with dysfunctional uterine bleeding   Pre-operative Diagnosis: Dysfunctional uterine bleeding  Post-operative Diagnosis: Same  Surgeon: Rubie Maid, MD  Assistants: None  Anesthesia: General endotracheal anesthesia   Procedure Details: Patient was seen in the preoperative holding area where the consent was reviewed. Patient was consented for the following procedure: Hysteroscopy Dilation and Curettage with NovaSure endometrial ablation. Pt was taken to the operating room # 9.    The patient was then placed under general anesthesia without difficulty.  She was then prepped and draped in the normal sterile fashion, and placed in the dorsal lithotomy position.  A time out was performed.  An exam under anesthesia was performed with the findings noted above.  Straight catheterization was performed. A sterile speculum was inserted into vagina. A single-tooth tenaculum was used to grasp the anterior lip of the cervix. Cervical dilation was performed. A 5 mm hysteroscope was introduced into the uterus under direct visualization. The cavity was allowed to fill, and then the entire cavity was explored with the findings described above. The hysteroscope was removed, and a sharp curette was then passed into the uterus and endometrial sampling was collected for pathology.   Next the NovaSure instrument was primed per instructions. The instrument was then placed into the endometrial canal however the uterine cavity did not pass the cavity assessment test.  The NovaSure endometrial ablation could not be performed.  The instrument was then removed from the uterine cavity.  The tenaculum was removed and excellent hemostasis was noted. The speculum was removed from the vagina.   All instrument and sponge counts were correct at the end of  the procedure x 2.  The patient tolerated the procedure well.  She was awakened and taken to the PACU in stable condition.   Findings: Uterus sounded to 6.5 cm.  Cervical length 2 cm.  Proliferative endometrium.  Tubal ostia were unable to be visualized bilaterally due to proliferative tissue and blood obscuring visualization.  No intrauterine masses.   Estimated Blood Loss:  minimal      Drains: straight catheterization with 50 cc at start of procedure.          Total IV Fluids:  600 ml  Specimens:  Endometrial curettings         Implants: None         Complications:  None; patient tolerated the procedure well.         Disposition: PACU - hemodynamically stable.         Condition: stable   Rubie Maid, MD [facility]

## 2017-08-24 NOTE — H&P (Signed)
UPDATE TO PREVIOUS HISTORY AND PHYSICAL  The patient has been seen and examined.  H&P is up to date, no changes noted.  Erika Williamson is a 41 y.o. G2P2000 here for surgical management of dysfunctional uterine bleeding.  Planned procedure is Hysteroscopy D&C with Novasure endometrial ablation. All questions answered. Patient can proceed to the OR for scheduled procedure.    Rubie Maid, MD 08/24/2017 7:25 AM

## 2017-08-24 NOTE — Transfer of Care (Addendum)
Immediate Anesthesia Transfer of Care Note  Patient: Erika Williamson  Procedure(s) Performed: DILATATION & CURETTAGE/HYSTEROSCOPY WITH NOVASURE ABLATION (N/A Vagina )  Patient Location: PACU  Anesthesia Type: General  Level of Consciousness: sedated  Airway & Oxygen Therapy: Patient Spontanous Breathing and Patient connected to face mask oxygen  Post-op Assessment: Report given to RN and Post -op Vital signs reviewed and stable  Post vital signs: Reviewed and stable  Last Vitals:  Vitals:   08/24/17 0653  BP: 109/69  Pulse: 68  Resp: 16  Temp: (!) 36 C  SpO2: 100%    Last Pain:  Vitals:   08/24/17 0653  TempSrc: Tympanic         Complications: No apparent anesthesia complications

## 2017-08-24 NOTE — Anesthesia Procedure Notes (Signed)
Procedure Name: LMA Insertion Performed by: Melessia Kaus, CRNA Pre-anesthesia Checklist: Patient identified, Patient being monitored, Timeout performed, Emergency Drugs available and Suction available Patient Re-evaluated:Patient Re-evaluated prior to induction Oxygen Delivery Method: Circle system utilized Preoxygenation: Pre-oxygenation with 100% oxygen Induction Type: IV induction Ventilation: Mask ventilation without difficulty LMA: LMA inserted LMA Size: 3.5 Tube type: Oral Number of attempts: 1 Placement Confirmation: positive ETCO2 and breath sounds checked- equal and bilateral Tube secured with: Tape Dental Injury: Teeth and Oropharynx as per pre-operative assessment        

## 2017-08-24 NOTE — Anesthesia Preprocedure Evaluation (Signed)
Anesthesia Evaluation  Patient identified by MRN, date of birth, ID band Patient awake    Reviewed: Allergy & Precautions, H&P , NPO status , Patient's Chart, lab work & pertinent test results  History of Anesthesia Complications Negative for: history of anesthetic complications  Airway Mallampati: III  TM Distance: <3 FB Neck ROM: full    Dental  (+) Chipped, Poor Dentition   Pulmonary neg shortness of breath, COPD, Current Smoker,           Cardiovascular Exercise Tolerance: Good (-) angina(-) Past MI and (-) DOE negative cardio ROS       Neuro/Psych  Headaches, PSYCHIATRIC DISORDERS Anxiety Depression    GI/Hepatic Neg liver ROS, GERD  Medicated and Controlled,  Endo/Other  negative endocrine ROS  Renal/GU      Musculoskeletal   Abdominal   Peds  Hematology negative hematology ROS (+)   Anesthesia Other Findings Past Medical History: 08/2013: Abnormal cervical Papanicolaou smear     Comment:  LGSIL pap, 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 No date: Anxiety No date: Atypical face pain     Comment:  Left Sided No date: Depression Left X2: Foot fracture No date: GERD (gastroesophageal reflux disease) No date: HPV (human papilloma virus) infection No date: Lupus 04/03/2016: Migraine without aura 07/22/2015: Moderate recurrent major depression ([facility]) No date: Shingles No date: Sinusitis No date: Tension headache No date: Tobacco abuse No date: Tonsillitis No date: Vitamin D deficiency disease  Past Surgical History: 06/09/2017: birth control removed No date: CESAREAN SECTION     Comment:  X2 No date: CESAREAN SECTION No date: CHOLECYSTECTOMY     Comment:  Dr Burt Knack 10/05/2013: LEEP     Comment:  h/o CIN II No date: TONSILLECTOMY No date: TUBAL LIGATION No date: WISDOM TOOTH EXTRACTION  BMI    Body Mass Index:  31.91 kg/m      Reproductive/Obstetrics negative OB ROS                              Anesthesia Physical Anesthesia Plan  ASA: III  Anesthesia Plan: General LMA   Post-op Pain Management:    Induction: Intravenous  PONV Risk Score and Plan: 3 and Ondansetron, Dexamethasone and Midazolam  Airway Management Planned: LMA  Additional Equipment:   Intra-op Plan:   Post-operative Plan: Extubation in OR  Informed Consent: I have reviewed the patients History and Physical, chart, labs and discussed the procedure including the risks, benefits and alternatives for the proposed anesthesia with the patient or authorized representative who has indicated his/her understanding and acceptance.   Dental Advisory Given  Plan Discussed with: Anesthesiologist, CRNA and Surgeon  Anesthesia Plan Comments: (Patient consented for risks of anesthesia including but not limited to:  - adverse reactions to medications - damage to teeth, lips or other oral mucosa - sore throat or hoarseness - Damage to heart, brain, lungs or loss of life  Patient voiced understanding.)        Anesthesia Quick Evaluation

## 2017-08-25 LAB — SURGICAL PATHOLOGY

## 2017-08-31 ENCOUNTER — Encounter: Payer: Self-pay | Admitting: Obstetrics and Gynecology

## 2017-08-31 ENCOUNTER — Ambulatory Visit (INDEPENDENT_AMBULATORY_CARE_PROVIDER_SITE_OTHER): Payer: BLUE CROSS/BLUE SHIELD | Admitting: Obstetrics and Gynecology

## 2017-08-31 VITALS — BP 124/81 | HR 96 | Ht 63.5 in | Wt 184.6 lb

## 2017-08-31 DIAGNOSIS — N840 Polyp of corpus uteri: Secondary | ICD-10-CM

## 2017-08-31 DIAGNOSIS — Z9889 Other specified postprocedural states: Secondary | ICD-10-CM

## 2017-08-31 DIAGNOSIS — N920 Excessive and frequent menstruation with regular cycle: Secondary | ICD-10-CM

## 2017-08-31 NOTE — Progress Notes (Signed)
    OBSTETRICS/GYNECOLOGY POST-OPERATIVE CLINIC VISIT  Subjective:     Erika Williamson is a 41 y.o. female who presents to the clinic 1 weeks status post diagnostic hysteroscopy D&C for abnormal uterine bleeding.  Was also supposed to have an endometrial ablation performed, however could not engage device (cavity testing failed). Eating a regular diet without difficulty. Bowel movements are normal. The patient is not having any pain.   Of note, patient states that she had an episode of bleeding after the surgery occurring last Friday which she believes was her menses as it came around the appropriate time.  States that bleeding was still fairly heavy despite having had the D&C performed earlier that week, lasting 5 days (currently on now but just down to light bleeding/spotting).  The following portions of the patient's history were reviewed and updated as appropriate: allergies, current medications, past family history, past medical history, past social history, past surgical history and problem list.  Review of Systems Pertinent items noted in HPI and remainder of comprehensive ROS otherwise negative.    Objective:    BP 124/81   Pulse 96   Ht 5' 3.5" (1.613 m)   Wt 184 lb 9.6 oz (83.7 kg)   LMP 08/27/2017   BMI 32.19 kg/m  General:  alert and no distress  Abdomen: soft, bowel sounds active, non-tender  Pelvis:   deferred.    Pathology:  A. ENDOMETRIAL CURETTINGS:  - SECRETORY ENDOMETRIUM.  - SMALL ENDOMETRIAL POLYP.  - BENIGN ENDOCERVICAL GLANDULAR MUCOSA.  - NEGATIVE FOR ATYPIA AND MALIGNANCY.   Assessment:   Doing well postoperatively.  Menorrhagia H/o endometrial polyp   Plan:   1. Continue any current medications. 2.. Operative findings again reviewed. Pathology report discussed. 3. Activity restrictions: none 4. Anticipated return to work: patient has already returned to work. 5. Discussed management options again with patient. Still declines any hormonal  therapy as she notes that it "made her crazy" and did not really work for her bleeding.  Offered performing endometrial ablation with Thermachoice, vs hysterectomy.  Patient would like to try for endometrial ablation again. Discusssed risks vs benefits of procedure.  Patient notes understanding. Will tentatively schedule for 09/22/2017.      Rubie Maid, MD [facility]

## 2017-08-31 NOTE — H&P (Signed)
GYNECOLOGY PREOPERATIVE HISTORY AND PHYSICAL   Subjective:  Erika Williamson is a 41 y.o. G2P2000 here for surgical management of dysfunctional uterine bleeding.  No significant preoperative concerns.  Proposed surgery: Hysteroscopy D&C with ThermaChoice endometrial ablation  Pertinent Gynecological History: Menses: flow is excessive with use of 5-8 pads or tampons on heaviest days and regular every 21 days without intermenstrual spotting Bleeding: dysfunctional uterine bleeding Contraception: none Last mammogram: Patient has never had a mammogram.  Last pap: normal Date: 08/03/2017. Has h/o abnormal pap smears, and h/o LEEP.    Past Medical History:  Diagnosis Date  . 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 Anxiety   . Atypical face pain    Left Sided  . Depression   . Foot fracture Left X2  . GERD (gastroesophageal reflux disease)   . HPV (human papilloma virus) infection   . Lupus   . Migraine without aura 04/03/2016  . 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 Shingles   . Sinusitis   . Tension headache   . Tobacco abuse   . Tonsillitis   . Vitamin D deficiency disease     Past Surgical History:  Procedure Laterality Date  . birth control removed  06/09/2017  . CESAREAN SECTION     X2  . CESAREAN SECTION    . CHOLECYSTECTOMY     Dr Burt Knack  . 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 TONSILLECTOMY    . TUBAL LIGATION    . WISDOM TOOTH EXTRACTION       OB History  Gravida Para Term Preterm AB Living  2 2 2         SAB TAB Ectopic Multiple Live Births               # Outcome Date GA Lbr Len/2nd Weight Sex Delivery Anes PTL Lv  2 Term      CS-LTranv     1 Term      CS-LTranv       Obstetric Comments  1st Menstrual Cycle:  10  1st Pregnancy:  12    Family History  Problem Relation Age of Onset  . Diabetes Mother   . Hypertension Mother   . COPD Mother   . Stroke Mother   .  Osteoporosis Mother   . Depression Mother   . Cancer Father 76       colorectal, spread to lung  . Alcohol abuse Father   . Hypertension Father   . Depression Father   . Diabetes Sister   . COPD Sister   . Anxiety disorder Sister   . Depression Sister   . Fibromyalgia Sister   . Asthma Sister   . ADD / ADHD Son   . ADD / ADHD Daughter     Social History   Socioeconomic History  . Marital status: Married    Spouse name: Not on file  . Number of children: Not on file  . Years of education: Not on file  . Highest education level: Not on file  Social Needs  . Financial resource strain: Not on file  . Food insecurity - worry: Not on file  . Food insecurity - inability: Not on file  . Transportation needs - medical: Not on file  . Transportation needs - non-medical: Not on file  Occupational History  . Not on file  Tobacco Use  . Smoking status: Current Every  Day Smoker    Packs/day: 1.00    Years: 22.00    Pack years: 22.00    Types: Cigarettes    Start date: 09/03/1993  . Smokeless tobacco: Never Used  Substance and Sexual Activity  . Alcohol use: No    Alcohol/week: 0.0 oz  . Drug use: No  . Sexual activity: Yes    Partners: Male    Birth control/protection: None  Other Topics Concern  . Not on file  Social History Narrative  . Not on file    Current Outpatient Medications on File Prior to Visit  Medication Sig Dispense Refill  . acetaminophen (TYLENOL) 500 MG tablet Take 1,000 mg by mouth every 6 (six) hours as needed for moderate pain.    . baclofen (LIORESAL) 10 MG tablet Take 10 mg by mouth 2 (two) times daily.    . cetirizine (ZYRTEC) 10 MG tablet Take 10 mg by mouth daily.    . halobetasol (ULTRAVATE) 0.05 % ointment Apply 1 application topically as needed for rash.    . hydrocortisone 2.5 % cream Apply 1 application topically as needed (rash). On face    . lamoTRIgine (LAMICTAL) 200 MG tablet Take 1 tablet (200 mg total) by mouth daily. 90 tablet 1  .  PARoxetine (PAXIL) 40 MG tablet Take 1 tablet (40 mg total) by mouth daily. 90 tablet 1  . rizatriptan (MAXALT) 10 MG tablet Take 10 mg by mouth as needed for migraine.     . trolamine salicylate (ASPERCREME) 10 % cream Apply 1 application topically as needed (neck pain).    Marland Kitchen oxyCODONE (ROXICODONE) 5 MG immediate release tablet Take 1 tablet (5 mg total) every 6 (six) hours as needed by mouth. (Patient not taking: Reported on 08/31/2017) 20 tablet 0   No current facility-administered medications on file prior to visit.     Allergies  Allergen Reactions  . Gabapentin Anaphylaxis  . Ivp Dye [Iodinated Diagnostic Agents] Anaphylaxis  . Ketorolac Anaphylaxis  . Metrizamide Anaphylaxis  . Naprosyn [Naproxen] Anaphylaxis  . Nsaids Anaphylaxis  . Tolmetin Anaphylaxis  . Tramadol Anaphylaxis  . Erythromycin     Flu like symptoms  . Famciclovir     unknown  . Levofloxacin Other (See Comments)    Muscle aches and contractions   . Seroquel [Quetiapine Fumarate] Hives  . Compazine [Prochlorperazine Edisylate] Rash and Other (See Comments)    Muscle aches  . Pristiq [Desvenlafaxine] Rash and Other (See Comments)    Muscle aches     Review of Systems Constitutional: No recent fever/chills/sweats Respiratory: No recent cough/bronchitis Cardiovascular: No chest pain Gastrointestinal: No recent nausea/vomiting/diarrhea Genitourinary: No UTI symptoms.  Positive for heavy menstrual bleeding Hematologic/lymphatic:No history of coagulopathy or recent blood thinner use    Objective:   Blood pressure 124/81, pulse 96, height 5' 3.5" (1.613 m), weight 184 lb 9.6 oz (83.7 kg), last menstrual period 08/27/2017. CONSTITUTIONAL: Well-developed, well-nourished female in no acute distress.  HENT:  Normocephalic, atraumatic, External right and left ear normal. Oropharynx is clear and moist EYES: Conjunctivae and EOM are normal. Pupils are equal, round, and reactive to light. No scleral icterus.    NECK: Normal range of motion, supple, no masses SKIN: Skin is warm and dry. No rash noted. Not diaphoretic. No erythema. No pallor. NEUROLOGIC: Alert and oriented to person, place, and time. Normal reflexes, muscle tone coordination. No cranial nerve deficit noted. PSYCHIATRIC: Normal mood and affect. Normal behavior. Normal judgment and thought content. CARDIOVASCULAR: Normal heart rate noted, regular  rhythm RESPIRATORY: Effort and breath sounds normal, no problems with respiration noted ABDOMEN: Soft, nontender, nondistended. PELVIC: Deferred MUSCULOSKELETAL: Normal range of motion. No edema and no tenderness. 2+ distal pulses.    Labs: Results for orders placed or performed during the hospital encounter of 08/24/17 (from the past 336 hour(s))  Pregnancy, urine POC   Collection Time: 08/24/17  6:24 AM  Result Value Ref Range   Preg Test, Ur NEGATIVE NEGATIVE  Surgical pathology   Collection Time: 08/24/17  8:41 AM  Results for orders placed or performed during the hospital encounter of 08/20/17 (from the past 336 hour(s))  Basic metabolic panel   Collection Time: 08/20/17 10:36 AM  Result Value Ref Range   Sodium 136 135 - 145 mmol/L   Potassium 3.8 3.5 - 5.1 mmol/L   Chloride 101 101 - 111 mmol/L   CO2 28 22 - 32 mmol/L   Glucose, Bld 89 65 - 99 mg/dL   BUN 5 (L) 6 - 20 mg/dL   Creatinine, Ser 0.55 0.44 - 1.00 mg/dL   Calcium 9.1 8.9 - 10.3 mg/dL   GFR calc non Af Amer >60 >60 mL/min   GFR calc Af Amer >60 >60 mL/min   Anion gap 7 5 - 15  CBC   Collection Time: 08/20/17 10:36 AM  Result Value Ref Range   WBC 10.3 3.6 - 11.0 K/uL   RBC 4.49 3.80 - 5.20 MIL/uL   Hemoglobin 14.5 12.0 - 16.0 g/dL   HCT 41.6 35.0 - 47.0 %   MCV 92.8 80.0 - 100.0 fL   MCH 32.4 26.0 - 34.0 pg   MCHC 34.9 32.0 - 36.0 g/dL   RDW 13.6 11.5 - 14.5 %   Platelets 361 150 - 440 K/uL    Pathology (08/24/2017):   DIAGNOSIS: A. ENDOMETRIAL CURETTINGS: - SECRETORY ENDOMETRIUM. - SMALL  ENDOMETRIAL POLYP. - BENIGN ENDOCERVICAL GLANDULAR MUCOSA. - NEGATIVE FOR ATYPIA AND MALIGNANCY.   Imaging Studies: No results found.  Assessment:    Dysfunctional uterine bleeding   Plan:    Counseling: Procedure, risks, reasons, benefits and complications (including injury to bowel, bladder, major blood vessel, ureter, bleeding, possibility of transfusion, infection, or fistula formation) reviewed in detail. Likelihood of success in alleviating the patient's condition was discussed. Routine postoperative instructions will be reviewed with the patient and her family in detail after surgery.  The patient concurred with the proposed plan, giving informed written consent for the surgery.   Preop testing ordered. Instructions reviewed, including NPO after midnight.   Rubie Maid, MD [facility]

## 2017-09-15 ENCOUNTER — Encounter: Payer: Self-pay | Admitting: Obstetrics and Gynecology

## 2017-09-15 ENCOUNTER — Other Ambulatory Visit: Payer: Self-pay

## 2017-09-15 ENCOUNTER — Encounter
Admission: RE | Admit: 2017-09-15 | Discharge: 2017-09-15 | Disposition: A | Discharge status: Home / Self Care | Payer: BLUE CROSS/BLUE SHIELD | Source: Ambulatory Visit | Attending: Obstetrics and Gynecology | Admitting: Obstetrics and Gynecology

## 2017-09-15 DIAGNOSIS — Z886 Allergy status to analgesic agent status: Secondary | ICD-10-CM | POA: Insufficient documentation

## 2017-09-15 DIAGNOSIS — Z823 Family history of stroke: Secondary | ICD-10-CM | POA: Diagnosis not present

## 2017-09-15 DIAGNOSIS — Z825 Family history of asthma and other chronic lower respiratory diseases: Secondary | ICD-10-CM | POA: Insufficient documentation

## 2017-09-15 DIAGNOSIS — Z811 Family history of alcohol abuse and dependence: Secondary | ICD-10-CM | POA: Insufficient documentation

## 2017-09-15 DIAGNOSIS — Z79891 Long term (current) use of opiate analgesic: Secondary | ICD-10-CM | POA: Insufficient documentation

## 2017-09-15 DIAGNOSIS — Z833 Family history of diabetes mellitus: Secondary | ICD-10-CM | POA: Diagnosis not present

## 2017-09-15 DIAGNOSIS — F17210 Nicotine dependence, cigarettes, uncomplicated: Secondary | ICD-10-CM | POA: Diagnosis not present

## 2017-09-15 DIAGNOSIS — Z9889 Other specified postprocedural states: Secondary | ICD-10-CM | POA: Diagnosis not present

## 2017-09-15 DIAGNOSIS — Z79899 Other long term (current) drug therapy: Secondary | ICD-10-CM | POA: Insufficient documentation

## 2017-09-15 DIAGNOSIS — F329 Major depressive disorder, single episode, unspecified: Secondary | ICD-10-CM | POA: Diagnosis not present

## 2017-09-15 DIAGNOSIS — Z8249 Family history of ischemic heart disease and other diseases of the circulatory system: Secondary | ICD-10-CM | POA: Insufficient documentation

## 2017-09-15 DIAGNOSIS — Z01812 Encounter for preprocedural laboratory examination: Secondary | ICD-10-CM | POA: Insufficient documentation

## 2017-09-15 DIAGNOSIS — N938 Other specified abnormal uterine and vaginal bleeding: Secondary | ICD-10-CM | POA: Diagnosis present

## 2017-09-15 DIAGNOSIS — E559 Vitamin D deficiency, unspecified: Secondary | ICD-10-CM | POA: Insufficient documentation

## 2017-09-15 DIAGNOSIS — F419 Anxiety disorder, unspecified: Secondary | ICD-10-CM | POA: Insufficient documentation

## 2017-09-15 DIAGNOSIS — Z818 Family history of other mental and behavioral disorders: Secondary | ICD-10-CM | POA: Insufficient documentation

## 2017-09-15 DIAGNOSIS — Z91041 Radiographic dye allergy status: Secondary | ICD-10-CM | POA: Diagnosis not present

## 2017-09-15 DIAGNOSIS — Z9049 Acquired absence of other specified parts of digestive tract: Secondary | ICD-10-CM | POA: Insufficient documentation

## 2017-09-15 DIAGNOSIS — Z888 Allergy status to other drugs, medicaments and biological substances status: Secondary | ICD-10-CM | POA: Insufficient documentation

## 2017-09-15 DIAGNOSIS — K219 Gastro-esophageal reflux disease without esophagitis: Secondary | ICD-10-CM | POA: Insufficient documentation

## 2017-09-15 DIAGNOSIS — Z885 Allergy status to narcotic agent status: Secondary | ICD-10-CM | POA: Insufficient documentation

## 2017-09-15 HISTORY — DX: Occipital neuralgia: M54.81

## 2017-09-15 HISTORY — DX: Family history of other specified conditions: Z84.89

## 2017-09-15 LAB — CBC
HEMATOCRIT: 44 % (ref 35.0–47.0)
HEMOGLOBIN: 14.8 g/dL (ref 12.0–16.0)
MCH: 30.8 pg (ref 26.0–34.0)
MCHC: 33.7 g/dL (ref 32.0–36.0)
MCV: 91.3 fL (ref 80.0–100.0)
Platelets: 388 10*3/uL (ref 150–440)
RBC: 4.81 MIL/uL (ref 3.80–5.20)
RDW: 13.5 % (ref 11.5–14.5)
WBC: 12.3 10*3/uL — ABNORMAL HIGH (ref 3.6–11.0)

## 2017-09-15 NOTE — Patient Instructions (Signed)
  Your procedure is scheduled on: September 22, 2017 Report to [facility] To find out your arrival time please call [phone] between 1PM - 3PM on September 21, 2017  Remember: Instructions that are not followed completely may result in serious medical risk, up to and including death, or upon the discretion of your surgeon and anesthesiologist your surgery may need to be rescheduled.     _X__ 1. Do not eat food after midnight the night before your procedure.                 No gum chewing or hard candies. You may drink clear liquids up to 2 hours                 before you are scheduled to arrive for your surgery- DO not drink clear                 liquids within 2 hours of the start of your surgery.                 Clear Liquids include:  water, apple juice without pulp, clear carbohydrate                 drink such as Clearfast of [place]art[place], Black Coffee or Tea (Do not add                 anything to coffee or tea).     _X__ 2.  No Alcohol for 24 hours before or after surgery.   _X__ 3.  Do Not Smoke or use e-cigarettes For 24 Hours Prior to Your Surgery.                 Do not use any chewable tobacco products for at least 6 hours prior to                 surgery.  _X___  4.  Bring NEW medications with you on the day of surgery if instructed.   __X__  5.  Notify your doctor if there is any change in your medical condition      (cold, fever, infections).     Do not wear jewelry, make-up, hairpins, clips or nail polish. Do not wear lotions, powders, or perfumes. You may NOT wear deodorant. Do not shave 48 hours prior to surgery. Men may shave face and neck. Do not bring valuables to the hospital.    [facility] is not responsible for any belongings or valuables.  Contacts, dentures or bridgework may not be worn into surgery. Leave your suitcase in the car. After surgery it may be brought to your room. For patients admitted to the hospital,  discharge time is determined by your treatment team.   Patients discharged the day of surgery will not be allowed to drive home.   Please read over the following fact sheets that you were given:     _x___ Take these medicines the morning of surgery with A SIP OF WATER:    1. lamictal  2. paxil  3. baclofen  4.  5.  6.

## 2017-09-22 ENCOUNTER — Encounter
Admission: RE | Disposition: A | Discharge status: Home / Self Care | Payer: Self-pay | Source: Ambulatory Visit | Attending: Obstetrics and Gynecology

## 2017-09-22 ENCOUNTER — Other Ambulatory Visit: Payer: Self-pay

## 2017-09-22 ENCOUNTER — Ambulatory Visit
Admission: RE | Admit: 2017-09-22 | Discharge: 2017-09-22 | Disposition: A | Discharge status: Home / Self Care | Payer: BLUE CROSS/BLUE SHIELD | Source: Ambulatory Visit | Attending: Obstetrics and Gynecology | Admitting: Obstetrics and Gynecology

## 2017-09-22 ENCOUNTER — Ambulatory Visit: Payer: BLUE CROSS/BLUE SHIELD | Admitting: Anesthesiology

## 2017-09-22 DIAGNOSIS — F419 Anxiety disorder, unspecified: Secondary | ICD-10-CM | POA: Insufficient documentation

## 2017-09-22 DIAGNOSIS — N938 Other specified abnormal uterine and vaginal bleeding: Secondary | ICD-10-CM | POA: Diagnosis not present

## 2017-09-22 DIAGNOSIS — N920 Excessive and frequent menstruation with regular cycle: Secondary | ICD-10-CM | POA: Insufficient documentation

## 2017-09-22 DIAGNOSIS — F17200 Nicotine dependence, unspecified, uncomplicated: Secondary | ICD-10-CM | POA: Diagnosis not present

## 2017-09-22 DIAGNOSIS — F329 Major depressive disorder, single episode, unspecified: Secondary | ICD-10-CM | POA: Insufficient documentation

## 2017-09-22 DIAGNOSIS — Z79899 Other long term (current) drug therapy: Secondary | ICD-10-CM | POA: Insufficient documentation

## 2017-09-22 DIAGNOSIS — K219 Gastro-esophageal reflux disease without esophagitis: Secondary | ICD-10-CM | POA: Insufficient documentation

## 2017-09-22 DIAGNOSIS — N840 Polyp of corpus uteri: Secondary | ICD-10-CM | POA: Diagnosis not present

## 2017-09-22 HISTORY — PX: DILITATION & CURRETTAGE/HYSTROSCOPY WITH THERMACHOICE ABLATION: SHX5569

## 2017-09-22 LAB — POCT PREGNANCY, URINE: Preg Test, Ur: NEGATIVE

## 2017-09-22 LAB — TYPE AND SCREEN
ABO/RH(D): A POS
Antibody Screen: NEGATIVE

## 2017-09-22 SURGERY — DILATATION & CURETTAGE/HYSTEROSCOPY WITH THERMACHOICE ABLATION
Anesthesia: General | Wound class: Clean Contaminated

## 2017-09-22 MED ORDER — FENTANYL CITRATE (PF) 100 MCG/2ML IJ SOLN
25.0000 ug | INTRAMUSCULAR | Status: AC | PRN
  Administered 2017-09-22 (×6): 25 ug via INTRAVENOUS

## 2017-09-22 MED ORDER — SODIUM CHLORIDE FLUSH 0.9 % IV SOLN
INTRAVENOUS | Status: AC
  Filled 2017-09-22: qty 10

## 2017-09-22 MED ORDER — FENTANYL CITRATE (PF) 100 MCG/2ML IJ SOLN
INTRAMUSCULAR | Status: AC
  Administered 2017-09-22: 25 ug via INTRAVENOUS
  Filled 2017-09-22: qty 2

## 2017-09-22 MED ORDER — ONDANSETRON HCL 4 MG/2ML IJ SOLN
INTRAMUSCULAR | Status: DC | PRN
  Administered 2017-09-22: 4 mg via INTRAVENOUS

## 2017-09-22 MED ORDER — LIDOCAINE HCL (PF) 2 % IJ SOLN
INTRAMUSCULAR | Status: AC
  Filled 2017-09-22: qty 10

## 2017-09-22 MED ORDER — MIDAZOLAM HCL 2 MG/2ML IJ SOLN
INTRAMUSCULAR | Status: AC
  Filled 2017-09-22: qty 2

## 2017-09-22 MED ORDER — MIDAZOLAM HCL 2 MG/2ML IJ SOLN
INTRAMUSCULAR | Status: DC | PRN
  Administered 2017-09-22: 2 mg via INTRAVENOUS

## 2017-09-22 MED ORDER — PANTOPRAZOLE SODIUM 40 MG IV SOLR
40.0000 mg | Freq: Once | INTRAVENOUS | Status: AC
  Administered 2017-09-22: 40 mg via INTRAVENOUS
  Filled 2017-09-22 (×2): qty 40

## 2017-09-22 MED ORDER — ONDANSETRON HCL 4 MG/2ML IJ SOLN
4.0000 mg | Freq: Once | INTRAMUSCULAR | Status: AC | PRN
  Administered 2017-09-22: 4 mg via INTRAVENOUS

## 2017-09-22 MED ORDER — DEXAMETHASONE SODIUM PHOSPHATE 10 MG/ML IJ SOLN
INTRAMUSCULAR | Status: DC | PRN
  Administered 2017-09-22: 10 mg via INTRAVENOUS

## 2017-09-22 MED ORDER — LIDOCAINE HCL (CARDIAC) 20 MG/ML IV SOLN
INTRAVENOUS | Status: DC | PRN
  Administered 2017-09-22: 60 mg via INTRAVENOUS

## 2017-09-22 MED ORDER — OXYCODONE HCL 5 MG PO TABS: 5.0000 mg | ORAL_TABLET | Freq: Four times a day (QID) | ORAL | 0 refills | Status: DC | PRN

## 2017-09-22 MED ORDER — PROPOFOL 10 MG/ML IV BOLUS
INTRAVENOUS | Status: DC | PRN
  Administered 2017-09-22: 160 mg via INTRAVENOUS

## 2017-09-22 MED ORDER — LACTATED RINGERS IV SOLN
INTRAVENOUS | Status: DC
  Administered 2017-09-22: 11:00:00 via INTRAVENOUS

## 2017-09-22 MED ORDER — PROPOFOL 10 MG/ML IV BOLUS
INTRAVENOUS | Status: AC
  Filled 2017-09-22: qty 20

## 2017-09-22 MED ORDER — DEXAMETHASONE SODIUM PHOSPHATE 10 MG/ML IJ SOLN
INTRAMUSCULAR | Status: AC
  Filled 2017-09-22: qty 1

## 2017-09-22 MED ORDER — HYDROMORPHONE HCL 1 MG/ML IJ SOLN
INTRAMUSCULAR | Status: AC
  Administered 2017-09-22: 0.5 mg via INTRAVENOUS
  Filled 2017-09-22: qty 1

## 2017-09-22 MED ORDER — ONDANSETRON HCL 4 MG/2ML IJ SOLN
INTRAMUSCULAR | Status: AC
  Filled 2017-09-22: qty 2

## 2017-09-22 MED ORDER — FENTANYL CITRATE (PF) 100 MCG/2ML IJ SOLN
INTRAMUSCULAR | Status: DC | PRN
  Administered 2017-09-22 (×2): 25 ug via INTRAVENOUS
  Administered 2017-09-22: 50 ug via INTRAVENOUS

## 2017-09-22 MED ORDER — FENTANYL CITRATE (PF) 100 MCG/2ML IJ SOLN
INTRAMUSCULAR | Status: AC
  Filled 2017-09-22: qty 2

## 2017-09-22 MED ORDER — HYDROMORPHONE HCL 1 MG/ML IJ SOLN
0.5000 mg | INTRAMUSCULAR | Status: DC | PRN
  Administered 2017-09-22 (×2): 0.5 mg via INTRAVENOUS

## 2017-09-22 SURGICAL SUPPLY — 19 items
CANISTER SUC SOCK COL 7IN (MISCELLANEOUS) IMPLANT
CATH ROBINSON RED A/P 16FR (CATHETERS) ×3 IMPLANT
DEVICE MYOSURE LITE (MISCELLANEOUS) IMPLANT
ELECT REM PT RETURN 9FT ADLT (ELECTROSURGICAL) ×3
ELECTRODE REM PT RTRN 9FT ADLT (ELECTROSURGICAL) ×1 IMPLANT
GLOVE BIO SURGEON STRL SZ 6.5 (GLOVE) ×2 IMPLANT
GLOVE BIO SURGEONS STRL SZ 6.5 (GLOVE) ×1
GOWN STRL REUS W/ TWL LRG LVL3 (GOWN DISPOSABLE) ×2 IMPLANT
GOWN STRL REUS W/TWL LRG LVL3 (GOWN DISPOSABLE) ×4
KIT RM TURNOVER CYSTO AR (KITS) ×3 IMPLANT
PACK DNC HYST (MISCELLANEOUS) ×3 IMPLANT
PAD OB MATERNITY 4.3X12.25 (PERSONAL CARE ITEMS) ×3 IMPLANT
PAD PREP 24X41 OB/GYN DISP (PERSONAL CARE ITEMS) ×3 IMPLANT
SET GENESYS HTA PROCERVA (MISCELLANEOUS) ×3 IMPLANT
SOL .9 NS 3000ML IRR  AL (IV SOLUTION) ×2
SOL .9 NS 3000ML IRR UROMATIC (IV SOLUTION) ×1 IMPLANT
TUBING CONNECTING 10 (TUBING) ×2 IMPLANT
TUBING CONNECTING 10' (TUBING) ×1
TUBING HYSTEROSCOPY DOLPHIN (MISCELLANEOUS) IMPLANT

## 2017-09-22 NOTE — Discharge Instructions (Signed)
General Gynecological Post-Operative Instructions You may expect to feel dizzy, weak, and drowsy for as long as 24 hours after receiving the medicine that made you sleep (anesthetic).  Do not drive a car, ride a bicycle, participate in physical activities, or take public transportation until you are done taking narcotic pain medicines or as directed by your doctor.  Do not drink alcohol or take tranquilizers.  Do not take medicine that has not been prescribed by your doctor.  Do not sign important papers or make important decisions while on narcotic pain medicines.  Have a responsible person with you.  CARE OF SURGICAL AREA: Keep area clean and dry. Take showers instead of baths until your doctor gives you permission to take baths.  Avoid heavy lifting (more than 10 pounds/4.5 kilograms), pushing, or pulling.  You may notice vaginal spotting or light bleeding, or experience a watery discharge for the next 1-2 weeks.  No sexual intercourse or placement of anything in the vagina for 2 weeks or as instructed by your doctor. Only take prescription or over-the-counter medicines  for pain, discomfort, or fever as directed by your doctor. Do not take aspirin. It can make you bleed. Take medicines (antibiotics) that kill germs if they are prescribed for you.  Call the office or go to the Emergency Room if:  You feel sick to your stomach (nauseous).  You start to throw up (vomit).  You have trouble eating or drinking.  You have an oral temperature above 101.  You have constipation that is not helped by adjusting diet or increasing fluid intake. Pain medicines are a common cause of constipation.  You have a foul smelling vaginal discharge or odor. You have any other concerns. SEEK IMMEDIATE MEDICAL CARE IF:  You have persistent dizziness.  You have difficulty breathing or a congested sounding (croupy) cough.  You have an oral temperature above 102.5, not controlled by medicine.  There is increasing  pain or tenderness near or in the surgical site.

## 2017-09-22 NOTE — Transfer of Care (Signed)
Immediate Anesthesia Transfer of Care Note  Patient: Erika Williamson  Procedure(s) Performed: DILATATION & CURETTAGE/HYSTEROSCOPY WITH [employer] [place] (N/A )  Patient Location: PACU  Anesthesia Type:General  Level of Consciousness: awake, alert  and patient cooperative  Airway & Oxygen Therapy: Patient Spontanous Breathing and Patient connected to nasal cannula oxygen  Post-op Assessment: Report given to RN and Post -op Vital signs reviewed and stable  Post vital signs: Reviewed and stable  Last Vitals:  Vitals:   09/22/17 1111 09/22/17 1329  BP: 133/76 140/77  Pulse: 75 88  Resp: 18 (!) 21  Temp: 37.1 C (!) 36.2 C  SpO2: 100% 98%    Last Pain:  Vitals:   09/22/17 1111  TempSrc: Temporal         Complications: No apparent anesthesia complications

## 2017-09-22 NOTE — H&P (Signed)
The patient has been seen and examined.  H&P is up to date, no changes noted.  Erika Williamson a 41 y.o.G2P2000 here for surgical management of dysfunctional uterine bleeding.  Planned procedure is Hysteroscopy D&C with ThermaChoice endometrial ablation. All questions answered. Patient can proceed to the OR for scheduled procedure.    Rubie Maid, MD [facility]

## 2017-09-22 NOTE — Anesthesia Postprocedure Evaluation (Signed)
Anesthesia Post Note  Patient: Erika Williamson  Procedure(s) Performed: DILATATION & CURETTAGE/HYSTEROSCOPY WITH [employer] [place] (N/A )  Patient location during evaluation: PACU Anesthesia Type: General Level of consciousness: awake and alert Pain management: pain level controlled Vital Signs Assessment: post-procedure vital signs reviewed and stable Respiratory status: spontaneous breathing and respiratory function stable Cardiovascular status: stable Anesthetic complications: no     Last Vitals:  Vitals:   09/22/17 1444 09/22/17 1450  BP:  119/71  Pulse: 94 70  Resp: 19 16  Temp:    SpO2: 94% 91%    Last Pain:  Vitals:   09/22/17 1444  TempSrc:   PainSc: 4                  Betzy Barbier K

## 2017-09-22 NOTE — Anesthesia Preprocedure Evaluation (Signed)
Anesthesia Evaluation  Patient identified by MRN, date of birth, ID band Patient awake    Reviewed: Allergy & Precautions, NPO status , Patient's Chart, lab work & pertinent test results  History of Anesthesia Complications (+) Family history of anesthesia reactionNegative for: history of anesthetic complications  Airway Mallampati: II       Dental   Pulmonary COPD (pt denies), Current Smoker,           Cardiovascular (-) hypertension(-) Past MI and (-) CHF (-) dysrhythmias (-) Valvular Problems/Murmurs     Neuro/Psych neg Seizures Anxiety Depression    GI/Hepatic Neg liver ROS, GERD  Medicated,  Endo/Other  neg diabetes  Renal/GU negative Renal ROS     Musculoskeletal   Abdominal   Peds  Hematology   Anesthesia Other Findings   Reproductive/Obstetrics                             Anesthesia Physical Anesthesia Plan  ASA: III  Anesthesia Plan: General   Post-op Pain Management:    Induction: Intravenous  PONV Risk Score and Plan:   Airway Management Planned: LMA  Additional Equipment:   Intra-op Plan:   Post-operative Plan:   Informed Consent: I have reviewed the patients History and Physical, chart, labs and discussed the procedure including the risks, benefits and alternatives for the proposed anesthesia with the patient or authorized representative who has indicated his/her understanding and acceptance.     Plan Discussed with:   Anesthesia Plan Comments:         Anesthesia Quick Evaluation

## 2017-09-22 NOTE — Op Note (Signed)
Procedure(s): DILATATION & CURETTAGE/HYSTEROSCOPY WITH HTA ABLATION Procedure Note  Erika Williamson female 41 y.o. 09/22/2017  Indications: The patient is a 41 y.o. G65P2002 female with dysfunctional uterine bleeding  Pre-operative Diagnosis: Dysfunctional uterine bleeding  Post-operative Diagnosis: Same  Surgeon: Rubie Maid, MD  Assistants: None  Anesthesia: General endotracheal anesthesia  Findings: Uterus sounded to 8 cm.  Proliferative endometrium.  Tubal ostia were not visualized bilaterally.  No intrauterine masses present.  Total ablation time 10 minutes   Adequate charring of endometrial tissue post ablation.  No perforations noted.   Procedure Details: The patient was seen in the Holding Room. The risks, benefits, complications, treatment options, and expected outcomes were discussed with the patient.  The patient concurred with the proposed plan, giving informed consent.  The site of surgery properly noted/marked. The patient was taken to the Operating Room, identified as Erika Williamson and the procedure verified as Procedure(s) (LRB): DILATATION & CURETTAGE/HYSTEROSCOPY WITH HTA ABLATION (N/A). A Time Out was held and the above information confirmed.  She was then placed under general anesthesia without difficulty. She was placed in the dorsal lithotomy position, and was prepped and draped in a sterile manner.  A straight catheterization was performed. A sterile speculum was inserted into the vagina and the cervix was grasped at the anterior lip using a single-toothed tenaculum.  The uterus was sounded to 8 cm.  Cervical dilation was performed. A 5 mm hysteroscope was introduced into the uterus under direct visualization. The cavity was allowed to fill, and then the entire cavity was explored with the findings described above. The hysteroscope was removed, and a sharp curette was then passed into the uterus and endometrial sampling was collected for pathology.    Next the HTA Ablation instrument was primed per instructions. The instrument was then placed into the endometrial canal and activated.  Total ablation time was 10 minutes, followed by a 1.5 minute cool down period. Adequate charring was visualize. The HTA Ablation instrument was then removed from the uterine cavity.  The tenaculum was removed and excellent hemostasis was noted. The speculum was removed from the vagina.   All instrument and sponge counts were correct at the end of the procedure x 2.  The patient tolerated the procedure well.  She was awakened and taken to the PACU in stable condition.   Estimated Blood Loss:  10 ml      Drains: straight catheterization with 300 mlat start of procedure.          Total IV Fluids:  600 ml  Specimens:  Endometrial curettings         Implants: None         Complications:  None; patient tolerated the procedure well.         Disposition: PACU - hemodynamically stable.         Condition: stable   Rubie Maid, MD [facility]

## 2017-09-22 NOTE — Anesthesia Procedure Notes (Signed)
Procedure Name: LMA Insertion Date/Time: 09/22/2017 12:19 PM Performed by: Eben Burow, CRNA Pre-anesthesia Checklist: Patient identified, Emergency Drugs available, Suction available, Patient being monitored and Timeout performed Patient Re-evaluated:Patient Re-evaluated prior to induction Oxygen Delivery Method: Circle system utilized Preoxygenation: Pre-oxygenation with 100% oxygen Induction Type: IV induction LMA: LMA inserted LMA Size: 4.0 Number of attempts: 1 Placement Confirmation: positive ETCO2 and breath sounds checked- equal and bilateral Tube secured with: Tape Dental Injury: Teeth and Oropharynx as per pre-operative assessment

## 2017-09-22 NOTE — Anesthesia Post-op Follow-up Note (Signed)
Anesthesia QCDR form completed.        

## 2017-09-23 ENCOUNTER — Encounter: Payer: Self-pay | Admitting: Obstetrics and Gynecology

## 2017-09-23 LAB — SURGICAL PATHOLOGY

## 2017-09-29 ENCOUNTER — Ambulatory Visit (INDEPENDENT_AMBULATORY_CARE_PROVIDER_SITE_OTHER): Payer: BLUE CROSS/BLUE SHIELD | Admitting: Psychiatry

## 2017-09-29 ENCOUNTER — Encounter: Payer: Self-pay | Admitting: Obstetrics and Gynecology

## 2017-09-29 ENCOUNTER — Ambulatory Visit (INDEPENDENT_AMBULATORY_CARE_PROVIDER_SITE_OTHER): Payer: BLUE CROSS/BLUE SHIELD | Admitting: Obstetrics and Gynecology

## 2017-09-29 ENCOUNTER — Encounter (HOSPITAL_COMMUNITY): Payer: Self-pay | Admitting: Psychiatry

## 2017-09-29 VITALS — BP 130/78 | HR 88 | Ht 63.5 in | Wt 185.0 lb

## 2017-09-29 VITALS — BP 120/81 | HR 91 | Ht 63.5 in | Wt 185.3 lb

## 2017-09-29 DIAGNOSIS — Z811 Family history of alcohol abuse and dependence: Secondary | ICD-10-CM

## 2017-09-29 DIAGNOSIS — R632 Polyphagia: Secondary | ICD-10-CM | POA: Diagnosis not present

## 2017-09-29 DIAGNOSIS — F4312 Post-traumatic stress disorder, chronic: Secondary | ICD-10-CM

## 2017-09-29 DIAGNOSIS — Z9889 Other specified postprocedural states: Secondary | ICD-10-CM

## 2017-09-29 DIAGNOSIS — F17210 Nicotine dependence, cigarettes, uncomplicated: Secondary | ICD-10-CM

## 2017-09-29 DIAGNOSIS — Z4889 Encounter for other specified surgical aftercare: Secondary | ICD-10-CM

## 2017-09-29 DIAGNOSIS — Z818 Family history of other mental and behavioral disorders: Secondary | ICD-10-CM | POA: Diagnosis not present

## 2017-09-29 DIAGNOSIS — Z79899 Other long term (current) drug therapy: Secondary | ICD-10-CM | POA: Diagnosis not present

## 2017-09-29 MED ORDER — BUSPIRONE HCL 5 MG PO TABS: 5.0000 mg | ORAL_TABLET | Freq: Two times a day (BID) | ORAL | 2 refills | Status: DC

## 2017-09-29 MED ORDER — LAMOTRIGINE 200 MG PO TABS: 200.0000 mg | ORAL_TABLET | Freq: Every day | ORAL | 1 refills | Status: DC

## 2017-09-29 MED ORDER — PAROXETINE HCL 40 MG PO TABS: 40.0000 mg | ORAL_TABLET | Freq: Every day | ORAL | 1 refills | Status: DC

## 2017-09-29 MED ORDER — NALTREXONE HCL 50 MG PO TABS: 50.0000 mg | ORAL_TABLET | Freq: Every day | ORAL | 1 refills | Status: DC

## 2017-09-29 NOTE — Progress Notes (Signed)
BH MD/PA/NP OP Progress Note  09/29/2017 3:05 PM Erika Williamson  MRN:  [id number]  Chief Complaint: med management  HPI: Erika Williamson reports that her mood has been generally stable, and she is discovering who she is as a person, has decided to leave her husband, feelings that the marriage has reached in and where it is no longer healthy or moving forward.  No abuse or unfaithful behaviors, she is simply done with the marriage.  She continues to have some anxiety breakthrough during the day.  We discussed the use of BuSpar 1-2 times per day as needed.  Spent time with the patient reviewing some of her social interactions, ongoing friendship with her friend [place] Bells, and developing a sense of herself, what she likes and dislikes about herself.  Spent time discussing concerns about weight gain, given that she has gained around 6-10 pounds over the past year.  She reports that she is quite frustrated with herself, as she tends to binge eat blood sugar and sporadic periods of time.  She reports that she tries to limit herself but then ends up spurring on more sugar cravings whenever she has any carbohydrates or sugars in excess.  I educated her about naltrexone which can be combined with Wellbutrin for treatment of binge eating episodes, and support weight loss.  Given that she has high anxiety, we agreed to hold off on initiating Wellbutrin, and to initiate naltrexone stand-alone to see if this can help support some of her carbohydrate cravings and decrease the reward pathway associated with sugar cravings.  She agrees to follow-up in 2-3 months, and we agreed to obtain laboratory studies as below.  Visit Diagnosis:    ICD-10-CM   1. Chronic post-traumatic stress disorder (PTSD) F43.12 PARoxetine (PAXIL) 40 MG tablet    lamoTRIgine (LAMICTAL) 200 MG tablet    TSH    T4, free    Vitamin D (25 hydroxy)    Cholesterol, Total    Cholesterol, Total    Vitamin D (25 hydroxy)    T4, free     TSH  2. Binge eating R63.2 naltrexone (DEPADE) 50 MG tablet  3. Encounter for long-term (current) use of high-risk medication Z79.899 TSH    T4, free    Vitamin D (25 hydroxy)    Cholesterol, Total    Cholesterol, Total    Vitamin D (25 hydroxy)    T4, free    TSH   Past Psychiatric History: See intake H&P for full details. Reviewed, with no updates at this time  Past Medical History:  Past Medical History:  Diagnosis Date  . 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 Anxiety   . Atypical face pain    Left Sided  . Depression   . Family history of adverse reaction to anesthesia    dad allergic to novacaine  . Foot fracture Left X2  . GERD (gastroesophageal reflux disease)   . HPV (human papilloma virus) infection   . Lupus   . Migraine without aura 04/03/2016  . 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 Occipital neuralgia of right side   . Shingles   . Sinusitis   . Tension headache   . Tobacco abuse   . Tonsillitis   . Vitamin D deficiency disease     Past Surgical History:  Procedure Laterality Date  . birth control removed  06/09/2017  . CESAREAN SECTION     X2  .  CESAREAN SECTION    . CHOLECYSTECTOMY     Dr Burt Knack  . COLONOSCOPY WITH PROPOFOL N/A 05/03/2015   Procedure: COLONOSCOPY WITH PROPOFOL;  Surgeon: Josefine Class, MD;  Location: [facility] ENDOSCOPY;  Service: Endoscopy;  Laterality: N/A;  . DILATION AND CURETTAGE OF UTERUS    . DILITATION & CURRETTAGE/HYSTROSCOPY WITH NOVASURE ABLATION N/A 08/24/2017   Procedure: DILATATION & CURETTAGE/HYSTEROSCOPY WITH NOVASURE ABLATION;  Surgeon: Rubie Maid, MD;  Location: [facility] ORS;  Service: Gynecology;  Laterality: N/A;  . DILITATION & CURRETTAGE/HYSTROSCOPY WITH THERMACHOICE ABLATION N/A 09/22/2017   Procedure: DILATATION & CURETTAGE/HYSTEROSCOPY WITH [employer] HTA  [place];  Surgeon: Rubie Maid, MD;  Location: [facility] ORS;   Service: Gynecology;  Laterality: N/A;  . ESOPHAGOGASTRODUODENOSCOPY N/A 05/03/2015   Procedure: ESOPHAGOGASTRODUODENOSCOPY (EGD);  Surgeon: Josefine Class, MD;  Location: [facility] ENDOSCOPY;  Service: Endoscopy;  Laterality: N/A;  . 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 TONSILLECTOMY    . TUBAL LIGATION    . WISDOM TOOTH EXTRACTION      Family Psychiatric History: See intake H&P for full details. Reviewed, with no updates at this time.   Family History:  Family History  Problem Relation Age of Onset  . Diabetes Mother   . Hypertension Mother   . COPD Mother   . Stroke Mother   . Osteoporosis Mother   . Depression Mother   . Cancer Father 41       colorectal, spread to lung  . Alcohol abuse Father   . Hypertension Father   . Depression Father   . Diabetes Sister   . COPD Sister   . Anxiety disorder Sister   . Depression Sister   . Fibromyalgia Sister   . Asthma Sister   . ADD / ADHD Son   . ADD / ADHD Daughter     Social History:  Social History   Socioeconomic History  . Marital status: Married    Spouse name: None  . Number of children: None  . Years of education: None  . Highest education level: None  Social Needs  . Financial resource strain: None  . Food insecurity - worry: None  . Food insecurity - inability: None  . Transportation needs - medical: None  . Transportation needs - non-medical: None  Occupational History  . None  Tobacco Use  . Smoking status: Current Every Day Smoker    Packs/day: 1.00    Years: 22.00    Pack years: 22.00    Types: Cigarettes    Start date: 09/03/1993  . Smokeless tobacco: Never Used  Substance and Sexual Activity  . Alcohol use: No    Alcohol/week: 0.0 oz  . Drug use: No  . Sexual activity: Yes    Partners: Male    Birth control/protection: None  Other Topics Concern  . None  Social History Narrative  . None    Allergies:  Allergies  Allergen Reactions  . Gabapentin Anaphylaxis  . Ivp Dye [Iodinated  Diagnostic Agents] Anaphylaxis  . Ketorolac Anaphylaxis  . Metrizamide Anaphylaxis  . Naprosyn [Naproxen] Anaphylaxis  . Nsaids Anaphylaxis  . Tolmetin Anaphylaxis  . Tramadol Anaphylaxis  . Erythromycin Other (See Comments)    Flu like symptoms  . Famciclovir Other (See Comments)    unknown  . Levofloxacin Other (See Comments)    Muscle aches and contractions   . Seroquel [Quetiapine Fumarate] Hives  . Compazine [Prochlorperazine Edisylate] Rash and Other (See Comments)    Muscle aches  .  Pristiq [Desvenlafaxine] Rash and Other (See Comments)    Muscle aches    Metabolic Disorder Labs: No results found for: HGBA1C, MPG No results found for: PROLACTIN No results found for: CHOL, TRIG, HDL, CHOLHDL, VLDL, LDLCALC Lab Results  Component Value Date   TSH 1.710 07/22/2015    Therapeutic Level Labs: No results found for: LITHIUM No results found for: VALPROATE No components found for:  CBMZ  Current Medications: Current Outpatient Medications  Medication Sig Dispense Refill  . acetaminophen (TYLENOL) 500 MG tablet Take 1,000 mg by mouth every 6 (six) hours as needed for moderate pain.    . baclofen (LIORESAL) 10 MG tablet Take 10 mg by mouth 2 (two) times daily.    . cetirizine (ZYRTEC) 10 MG tablet Take 10 mg by mouth daily.    . halobetasol (ULTRAVATE) 0.05 % ointment Apply 1 application topically as needed for rash.    . hydrocortisone 2.5 % cream Apply 1 application topically as needed (rash). On face    . lamoTRIgine (LAMICTAL) 200 MG tablet Take 1 tablet (200 mg total) by mouth daily. 90 tablet 1  . PARoxetine (PAXIL) 40 MG tablet Take 1 tablet (40 mg total) by mouth daily. 90 tablet 1  . prednisoLONE acetate (PRED FORTE) 1 % ophthalmic suspension Place 1 drop into the right eye 4 (four) times daily.    . rizatriptan (MAXALT) 10 MG tablet Take 10 mg by mouth as needed for migraine.     . trolamine salicylate (ASPERCREME) 10 % cream Apply 1 application topically as  needed (neck pain).    . busPIRone (BUSPAR) 5 MG tablet Take 1 tablet (5 mg total) by mouth 2 (two) times daily. 60 tablet 2  . naltrexone (DEPADE) 50 MG tablet Take 1 tablet (50 mg total) by mouth daily. Take 1/2 tablet for 1 week, then increase to 1 tablet 90 tablet 1   No current facility-administered medications for this visit.      Musculoskeletal: Strength & Muscle Tone: within normal limits Gait & Station: normal Patient leans: N/A  Psychiatric Specialty Exam: ROS  Blood pressure 130/78, pulse 88, height 5' 3.5" (1.613 m), weight 185 lb (83.9 kg), last menstrual period 09/21/2017.Body mass index is 32.26 kg/m.  General Appearance: Casual and Fairly Groomed  Eye Contact:  Fair  Speech:  Clear and Coherent  Volume:  Normal  Mood:  Euthymic  Affect:  Congruent  Thought Process:  Goal Directed and Descriptions of Associations: Intact  Orientation:  Full (Time, Place, and Person)  Thought Content: Logical   Suicidal Thoughts:  No  Homicidal Thoughts:  No  Memory:  Immediate;   Fair  Judgement:  Fair  Insight:  Fair  Psychomotor Activity:  Normal  Concentration:  Attention Span: Fair  Recall:  [employer] of Knowledge: Fair  Language: Fair  Akathisia:  Negative  Handed:  Right  AIMS (if indicated): not done  Assets:  Communication Skills Desire for Improvement Financial Resources/Insurance Housing Social Support Transportation Vocational/Educational  ADL's:  Intact  Cognition: WNL  Sleep:  Fair   Screenings: PHQ2-9     Office Visit from 06/15/2016 in [facility] Office Visit from 04/03/2016 in [facility] Counselor from 02/26/2016 in [place] Office Visit from 08/07/2015 in [place] Visit from 07/31/2015 in [place]  PHQ-2 Total Score  4  3  6  4  6   PHQ-9 Total Score  15  16  27  13  21      Assessment and Plan: Samhitha B Simkin presents with some  breakthrough anxiety, but overall mood stability and no acute safety issues or substance abuse.  We agreed to initiate BuSpar to help support her through periods of increased anxiety, and reviewed the mechanism of action and side effects.  Given her sporadic struggle with binge eating episodes, we also agreed to a trial of naltrexone to reduce cravings for sugar and carbohydrate binge episodes.  She realizes that this is typically prescribe with Wellbutrin, but has some negative personal associations with Wellbutrin and wishes to just try naltrexone stand-alone.  No acute safety issues and we will follow-up in 2-3 months.  She is not currently on any opiates.   1. Chronic post-traumatic stress disorder (PTSD)   2. Binge eating   3. Encounter for long-term (current) use of high-risk medication     Status of current problems: stable  Labs Ordered: Orders Placed This Encounter  Procedures  . TSH    Standing Status:   Future    Number of Occurrences:   1    Standing Expiration Date:   09/29/2018  . T4, free    Standing Status:   Future    Number of Occurrences:   1    Standing Expiration Date:   09/29/2018  . Vitamin D (25 hydroxy)    Standing Status:   Future    Number of Occurrences:   1    Standing Expiration Date:   09/29/2018  . Cholesterol, Total    Standing Status:   Future    Number of Occurrences:   1    Standing Expiration Date:   09/29/2018    Labs Reviewed: n/a  Collateral Obtained/Records Reviewed: n/a  Plan:  Continue Paxil 40 mg daily, Lamictal 200 mg daily Initiate naltrexone to reduce episodes of binge eating and carbohydrate cravings  Initiate BuSpar 5 mg twice daily for anxiety Laboratory studies as ordered to investigate weight changes  I spent 30 minutes with the patient in direct face-to-face clinical care.  Greater than 50% of this time was spent in counseling and coordination of care with the patient.    Aundra Dubin, MD 09/29/2017, 3:05 PM

## 2017-09-29 NOTE — Progress Notes (Signed)
    OBSTETRICS/GYNECOLOGY POST-OPERATIVE CLINIC VISIT  Subjective:     Erika Williamson is a 41 y.o. [id number] female who presents to the clinic 1 weeks status post hysteroscopy D&C with HTA endometrial ablation for abnormal uterine bleeding. Eating a regular diet without difficulty. Bowel movements are normal. The patient is not having any pain.  The following portions of the patient's history were reviewed and updated as appropriate: allergies, current medications, past family history, past medical history, past social history, past surgical history and problem list.  Review of Systems Pertinent items noted in HPI and remainder of comprehensive ROS otherwise negative.    Objective:    BP 120/81 (BP Location: Left Arm, Patient Position: Sitting, Cuff Size: Large)   Pulse 91   Ht 5' 3.5" (1.613 m)   Wt 185 lb 4.8 oz (84.1 kg)   LMP 09/21/2017   BMI 32.31 kg/m  General:  alert and no distress  Abdomen: soft, bowel sounds active, non-tender  Pelvis:   deferred    Pathology:  A. ENDOMETRIUM; CURETTAGE:  - MENSTRUAL PHASE ENDOMETRIUM.  - NEGATIVE FOR ATYPIA AND MALIGNANCY.  Assessment:    Doing well postoperatively.   Plan:   1. Continue any OTC medications as needed. 2. Operative findings again reviewed. Pathology report discussed. 3. Activity restrictions: pelvic rest x 1 week.  4. Anticipated return to work: now. 5. Follow up: as needed.    Rubie Maid, MD [facility]

## 2017-09-30 LAB — SPECIMEN STATUS

## 2017-10-01 ENCOUNTER — Telehealth (HOSPITAL_COMMUNITY): Payer: Self-pay

## 2017-10-01 ENCOUNTER — Other Ambulatory Visit (HOSPITAL_COMMUNITY): Payer: Self-pay

## 2017-10-01 LAB — VITAMIN D 25 HYDROXY (VIT D DEFICIENCY, FRACTURES): VIT D 25 HYDROXY: 22.9 ng/mL — AB (ref 30.0–100.0)

## 2017-10-01 LAB — TSH: TSH: 1.67 u[IU]/mL (ref 0.450–4.500)

## 2017-10-01 LAB — CHOLESTEROL, TOTAL: Cholesterol, Total: 214 mg/dL — ABNORMAL HIGH (ref 100–199)

## 2017-10-01 LAB — T4, FREE: FREE T4: 0.97 ng/dL (ref 0.82–1.77)

## 2017-10-01 LAB — SPECIMEN STATUS REPORT

## 2017-10-01 MED ORDER — VITAMIN D (ERGOCALCIFEROL) 1.25 MG (50000 UNIT) PO CAPS: 50000.0000 [IU] | ORAL_CAPSULE | ORAL | 0 refills | Status: DC

## 2017-10-01 MED ORDER — BUSPIRONE HCL 10 MG PO TABS: 10.0000 mg | ORAL_TABLET | Freq: Three times a day (TID) | ORAL | 2 refills | Status: DC

## 2017-10-01 NOTE — Telephone Encounter (Signed)
Patient called and states that the medication that you gave her for anxiety does not seem to be working and is wondering if you could increase the dose. Please review and advise, thank you

## 2017-10-01 NOTE — Telephone Encounter (Signed)
New order sent to the pharmacy, I called the patient and let her know the new dose and directions. She will call with any questions

## 2017-10-01 NOTE — Telephone Encounter (Signed)
Sure we can increase to 10 mg TID

## 2017-10-04 ENCOUNTER — Telehealth (HOSPITAL_COMMUNITY): Payer: Self-pay

## 2017-10-04 MED ORDER — HYDROXYZINE HCL 10 MG PO TABS: 10.0000 mg | ORAL_TABLET | Freq: Three times a day (TID) | ORAL | 0 refills | Status: DC | PRN

## 2017-10-04 NOTE — Telephone Encounter (Signed)
She should stop the buspar - seems like its increased her anxiety. She can have vistaril 10 mg TID prn - send 90 tablets

## 2017-10-04 NOTE — Telephone Encounter (Signed)
Patient called last week and was still having some anxiety, you increased the Buspar. Patient is calling today stating that she feels even worse and she would rather deal with the anxiety than feel bad. She would like to know if you can recommend something else. Please review and advise, thank you

## 2017-10-04 NOTE — Telephone Encounter (Signed)
sent in the new order for Vistaril and called patient to let her know.

## 2017-10-21 ENCOUNTER — Telehealth: Payer: Self-pay | Admitting: Obstetrics and Gynecology

## 2017-10-21 MED ORDER — HYDROCODONE-ACETAMINOPHEN 5-325 MG PO TABS: 1.0000 | ORAL_TABLET | Freq: Four times a day (QID) | ORAL | 0 refills | Status: DC | PRN

## 2017-10-21 NOTE — Telephone Encounter (Signed)
Spoke with pt and we realized that Tramadol is on her allergies list. Spoke with [facility] in office and offered Vicodin, pt states she has taken this before and would like to try this. Rx was printed and  Pt is aware that this needs to be picked up

## 2017-10-21 NOTE — Telephone Encounter (Signed)
AC  Pt called concerned about severe abdominal cramping. States pain is so bad difficult to walk or touch her stomach. States she has tried tylenol but no relief. States she was given perocet after the procedure but she lost rx but does not desire to take something that strong.  Denies nausea,vomiting,diarrhea

## 2017-10-21 NOTE — Telephone Encounter (Signed)
The patient called and stated that she is having severe menstrual cramping, The patient is not able to stand up or do her daily tasks, The patient would like for her nurse to call her as soon as possible. No other information was disclosed. Please advise.   *The patient has tried to take tylenol but it has not helped with anything as far as cramping or pain.*

## 2017-10-21 NOTE — Telephone Encounter (Signed)
Please advise on warm compress to abdomen.  Can prescribe Tramadol for her pain which is an "in between medication".  If her symptoms get worse, she should come in for a visit.

## 2017-12-29 ENCOUNTER — Ambulatory Visit (HOSPITAL_COMMUNITY): Payer: Self-pay | Admitting: Psychiatry

## 2018-02-25 ENCOUNTER — Other Ambulatory Visit: Payer: Self-pay | Admitting: Nurse Practitioner

## 2018-02-25 DIAGNOSIS — M542 Cervicalgia: Secondary | ICD-10-CM

## 2018-03-01 ENCOUNTER — Encounter (HOSPITAL_COMMUNITY): Payer: Self-pay | Admitting: Psychiatry

## 2018-03-01 ENCOUNTER — Encounter

## 2018-03-01 ENCOUNTER — Ambulatory Visit (INDEPENDENT_AMBULATORY_CARE_PROVIDER_SITE_OTHER): Payer: BLUE CROSS/BLUE SHIELD | Admitting: Psychiatry

## 2018-03-01 VITALS — BP 117/82 | HR 72 | Ht 63.5 in | Wt 159.0 lb

## 2018-03-01 DIAGNOSIS — F17210 Nicotine dependence, cigarettes, uncomplicated: Secondary | ICD-10-CM | POA: Diagnosis not present

## 2018-03-01 DIAGNOSIS — Z818 Family history of other mental and behavioral disorders: Secondary | ICD-10-CM

## 2018-03-01 DIAGNOSIS — Z635 Disruption of family by separation and divorce: Secondary | ICD-10-CM | POA: Diagnosis not present

## 2018-03-01 DIAGNOSIS — Z811 Family history of alcohol abuse and dependence: Secondary | ICD-10-CM | POA: Diagnosis not present

## 2018-03-01 DIAGNOSIS — R632 Polyphagia: Secondary | ICD-10-CM | POA: Diagnosis not present

## 2018-03-01 DIAGNOSIS — F331 Major depressive disorder, recurrent, moderate: Secondary | ICD-10-CM | POA: Diagnosis not present

## 2018-03-01 DIAGNOSIS — Z79899 Other long term (current) drug therapy: Secondary | ICD-10-CM

## 2018-03-01 DIAGNOSIS — F4312 Post-traumatic stress disorder, chronic: Secondary | ICD-10-CM

## 2018-03-01 MED ORDER — TRAZODONE HCL 50 MG PO TABS: 50.0000 mg | ORAL_TABLET | Freq: Every evening | ORAL | 0 refills | Status: DC | PRN

## 2018-03-01 MED ORDER — NALTREXONE HCL 50 MG PO TABS: 50.0000 mg | ORAL_TABLET | Freq: Every day | ORAL | 1 refills | Status: DC

## 2018-03-01 MED ORDER — LAMOTRIGINE 200 MG PO TABS: 200.0000 mg | ORAL_TABLET | Freq: Every day | ORAL | 1 refills | Status: DC

## 2018-03-01 MED ORDER — PAROXETINE HCL 40 MG PO TABS: 40.0000 mg | ORAL_TABLET | Freq: Every day | ORAL | 1 refills | Status: DC

## 2018-03-01 NOTE — Progress Notes (Signed)
BH MD/PA/NP OP Progress Note  03/01/2018 12:35 PM Erika Williamson  MRN:  [id number]  Chief Complaint: lots of life changes   HPI: Erika Williamson reports that she has had multiple positive life changes, setting better boundaries with her job, and has separated from her husband.  She reports that she is now living in her home that was left to her when her extended family passed away years ago.  She reports that things are going well with her transition in her job at work, and she is gradually making changes to her life, to address the things that have been contributing to stress.  She reports that her mood is overall very good despite the many changes.  Denies any substance abuse or unsafe thoughts.  She reports that she has been able to lose significant weight with the recent initiation of naltrexone in December, she has lost over 20 pounds through diet and a reduction of binge cravings.  We agreed to continue the current regimen of Paxil and Lamictal, in addition to naltrexone and we will follow-up in 8 weeks or sooner if needed.  Spent time with the patient processing some of these stressors, and her motivations for making these changes.  We spent time discussing the value of behavioral interventions and life changes in the face of stressors.  She shared some of her grief about loss of her marriage and upcoming challenges and considerations and potentially dating.  Spent time processing some of her anxieties about the proceedings of divorce.  Visit Diagnosis:    ICD-10-CM   1. Chronic post-traumatic stress disorder (PTSD) F43.12 traZODone (DESYREL) 50 MG tablet    PARoxetine (PAXIL) 40 MG tablet    lamoTRIgine (LAMICTAL) 200 MG tablet  2. Depression, major, recurrent, moderate (HCC) F33.1 traZODone (DESYREL) 50 MG tablet  3. MDD (major depressive disorder), recurrent episode, moderate (HCC) F33.1 traZODone (DESYREL) 50 MG tablet  4. Binge eating R63.2 naltrexone (DEPADE) 50 MG tablet     Past Psychiatric History: See intake H&P for full details. Reviewed, with no updates at this time.  Past Medical History:  Past Medical History:  Diagnosis Date  . 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 Anxiety   . Atypical face pain    Left Sided  . Depression   . Family history of adverse reaction to anesthesia    dad allergic to novacaine  . Foot fracture Left X2  . GERD (gastroesophageal reflux disease)   . HPV (human papilloma virus) infection   . Lupus ([place])   . Migraine without aura 04/03/2016  . Moderate recurrent major depression ([place]) 07/22/2015  . Occipital neuralgia of right side   . Shingles   . Sinusitis   . Tension headache   . Tobacco abuse   . Tonsillitis   . Vitamin D deficiency disease     Past Surgical History:  Procedure Laterality Date  . birth control removed  06/09/2017  . CESAREAN SECTION     X2  . CESAREAN SECTION    . CHOLECYSTECTOMY     Dr Burt Knack  . COLONOSCOPY WITH PROPOFOL N/A 05/03/2015   Procedure: COLONOSCOPY WITH PROPOFOL;  Surgeon: Josefine Class, MD;  Location: [facility] ENDOSCOPY;  Service: Endoscopy;  Laterality: N/A;  . DILATION AND CURETTAGE OF UTERUS    . DILITATION & CURRETTAGE/HYSTROSCOPY WITH NOVASURE ABLATION N/A 08/24/2017   Procedure: DILATATION & CURETTAGE/HYSTEROSCOPY WITH NOVASURE ABLATION;  Surgeon: Rubie Maid, MD;  Location: [facility] ORS;  Service: Gynecology;  Laterality: N/A;  . DILITATION & CURRETTAGE/HYSTROSCOPY WITH THERMACHOICE ABLATION N/A 09/22/2017   Procedure: DILATATION & CURETTAGE/HYSTEROSCOPY WITH BOSTON SCIENTIFIC GENESYS HTA  [place];  Surgeon: Rubie Maid, MD;  Location: [facility] ORS;  Service: Gynecology;  Laterality: N/A;  . ESOPHAGOGASTRODUODENOSCOPY N/A 05/03/2015   Procedure: ESOPHAGOGASTRODUODENOSCOPY (EGD);  Surgeon: Josefine Class, MD;  Location: [facility] ENDOSCOPY;  Service: Endoscopy;  Laterality: N/A;  . 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  TONSILLECTOMY    . TUBAL LIGATION    . WISDOM TOOTH EXTRACTION      Family Psychiatric History: See intake H&P for full details. Reviewed, with no updates at this time.   Family History:  Family History  Problem Relation Age of Onset  . Diabetes Mother   . Hypertension Mother   . COPD Mother   . Stroke Mother   . Osteoporosis Mother   . Depression Mother   . Cancer Father 43       colorectal, spread to lung  . Alcohol abuse Father   . Hypertension Father   . Depression Father   . Diabetes Sister   . COPD Sister   . Anxiety disorder Sister   . Depression Sister   . Fibromyalgia Sister   . Asthma Sister   . ADD / ADHD Son   . ADD / ADHD Daughter     Social History:  Social History   Socioeconomic History  . Marital status: Married    Spouse name: Not on file  . Number of children: Not on file  . Years of education: Not on file  . Highest education level: Not on file  Occupational History  . Not on file  Social Needs  . Financial resource strain: Not on file  . Food insecurity:    Worry: Not on file    Inability: Not on file  . Transportation needs:    Medical: Not on file    Non-medical: Not on file  Tobacco Use  . Smoking status: Current Every Day Smoker    Packs/day: 1.00    Years: 22.00    Pack years: 22.00    Types: Cigarettes    Start date: 09/03/1993  . Smokeless tobacco: Never Used  Substance and Sexual Activity  . Alcohol use: No    Alcohol/week: 0.0 oz  . Drug use: No  . Sexual activity: Yes    Partners: Male    Birth control/protection: None  Lifestyle  . Physical activity:    Days per week: Not on file    Minutes per session: Not on file  . Stress: Not on file  Relationships  . Social connections:    Talks on phone: Not on file    Gets together: Not on file    Attends religious service: Not on file    Active member of club or organization: Not on file    Attends meetings of clubs or organizations: Not on file    Relationship  status: Not on file  Other Topics Concern  . Not on file  Social History Narrative  . Not on file    Allergies:  Allergies  Allergen Reactions  . Gabapentin Anaphylaxis  . Ivp Dye [Iodinated Diagnostic Agents] Anaphylaxis  . Ketorolac Anaphylaxis  . Metrizamide Anaphylaxis  . Naprosyn [Naproxen] Anaphylaxis  . Nsaids Anaphylaxis  . Tolmetin Anaphylaxis  . Tramadol Anaphylaxis  . Erythromycin Other (See Comments)    Flu like symptoms  . Famciclovir Other (  See Comments)    unknown  . Levofloxacin Other (See Comments)    Muscle aches and contractions   . Seroquel [Quetiapine Fumarate] Hives  . Compazine [Prochlorperazine Edisylate] Rash and Other (See Comments)    Muscle aches  . Pristiq [Desvenlafaxine] Rash and Other (See Comments)    Muscle aches    Metabolic Disorder Labs: No results found for: HGBA1C, MPG No results found for: PROLACTIN Lab Results  Component Value Date   CHOL 214 (H) 09/29/2017   Lab Results  Component Value Date   TSH 1.670 09/29/2017   TSH 1.710 07/22/2015    Therapeutic Level Labs: No results found for: LITHIUM No results found for: VALPROATE No components found for:  CBMZ  Current Medications: Current Outpatient Medications  Medication Sig Dispense Refill  . acetaminophen (TYLENOL) 500 MG tablet Take 1,000 mg by mouth every 6 (six) hours as needed for moderate pain.    . baclofen (LIORESAL) 10 MG tablet Take 10 mg by mouth 3 (three) times daily.     . cetirizine (ZYRTEC) 10 MG tablet Take 10 mg by mouth daily.    . halobetasol (ULTRAVATE) 0.05 % ointment Apply 1 application topically as needed for rash.    . hydrocortisone 2.5 % cream Apply 1 application topically as needed (rash). On face    . hydrOXYzine (ATARAX/VISTARIL) 10 MG tablet Take 1 tablet (10 mg total) by mouth 3 (three) times daily as needed. 90 tablet 0  . lamoTRIgine (LAMICTAL) 200 MG tablet Take 1 tablet (200 mg total) by mouth daily. 90 tablet 1  . methocarbamol  (ROBAXIN) 750 MG tablet Take 750 mg by mouth 2 (two) times daily.    . naltrexone (DEPADE) 50 MG tablet Take 1 tablet (50 mg total) by mouth daily. Take 1/2 tablet for 1 week, then increase to 1 tablet 90 tablet 1  . PARoxetine (PAXIL) 40 MG tablet Take 1 tablet (40 mg total) by mouth daily. 90 tablet 1  . rizatriptan (MAXALT) 10 MG tablet Take 10 mg by mouth as needed for migraine.     . trolamine salicylate (ASPERCREME) 10 % cream Apply 1 application topically as needed (neck pain).    . Vitamin D, Ergocalciferol, (DRISDOL) 50000 units CAPS capsule Take 1 capsule (50,000 Units total) by mouth every 7 (seven) days. 8 capsule 0  . HYDROcodone-acetaminophen (NORCO/VICODIN) 5-325 MG tablet Take 1-2 tablets by mouth every 6 (six) hours as needed for moderate pain. (Patient not taking: Reported on 03/01/2018) 20 tablet 0  . traZODone (DESYREL) 50 MG tablet Take 1 tablet (50 mg total) by mouth at bedtime as needed for sleep. Take 1-2 tablets at bedtime as needed 90 tablet 0   No current facility-administered medications for this visit.      Musculoskeletal: Strength & Muscle Tone: within normal limits Gait & Station: normal Patient leans: N/A  Psychiatric Specialty Exam: ROS  Blood pressure 117/82, pulse 72, height 5' 3.5" (1.613 m), weight 159 lb (72.1 kg), SpO2 97 %.Body mass index is 27.72 kg/m.  General Appearance: Casual and Well Groomed  Eye Contact:  Good  Speech:  Clear and Coherent and Normal Rate  Volume:  Normal  Mood:  Euthymic  Affect:  Congruent  Thought Process:  Coherent and Descriptions of Associations: Intact  Orientation:  Full (Time, Place, and Person)  Thought Content: Logical   Suicidal Thoughts:  No  Homicidal Thoughts:  No  Memory:  Immediate;   Good  Judgement:  Fair  Insight:  Fair  Psychomotor Activity:  Normal  Concentration:  Attention Span: Good  Recall:  Good  Fund of Knowledge: Good  Language: Good  Akathisia:  Negative  Handed:  Right  AIMS (if  indicated): not done  Assets:  Communication Skills Desire for Improvement Financial Resources/Insurance Housing  ADL's:  Intact  Cognition: WNL  Sleep:  Fair   Screenings: PHQ2-9     Office Visit from 06/15/2016 in [facility] Office Visit from 04/03/2016 in [facility] Counselor from 02/26/2016 in [place] Office Visit from 08/07/2015 in [place] Visit from 07/31/2015 in [place]  PHQ-2 Total Score  4  3  6  4  6   PHQ-9 Total Score  15  16  27  13  21        Assessment and Plan:  Erika Williamson reports that she has had multiple positive stressors and positive changes, and feels that her new position at work and separating from her husband have been able to positively contribute to her mood and anxiety levels.  She continues on Paxil and Lamictal, continues to struggle with difficulty with her sleep, complicated by chronic neck pain and nerve pain.  We discussed low-dose use of trazodone nightly and she was receptive to a trial of this medication.  She has lost a significant amount of weight with use of naltrexone to help with binge cravings and carbohydrate cravings.  No acute safety issues and we will follow-up in 8 weeks.  Disclosed to patient that this Probation officer is leaving this practice at the end of August 2019, and patients always has the right to choose their provider. Reassured patient that office will work to provide smooth transition of care whether they wish to remain at this office, or to continue with this provider, or seek alternative care options in community.  They expressed understanding.   1. Chronic post-traumatic stress disorder (PTSD)   2. Depression, major, recurrent, moderate ([place])   3. MDD (major depressive disorder), recurrent episode, moderate ([place])   4. Binge eating     Status of current problems: gradually improving  Labs Ordered: No orders of the  defined types were placed in this encounter.   Labs Reviewed: n/a  Collateral Obtained/Records Reviewed: n/a  Plan:  Continue Paxil 40 mg daily, Lamictal 200 mg daily Continue naltrexone 50 mg daily for binge episode cravings Trazodone 50-100 mg nightly Return to clinic in 8-10 weeks   Aundra Dubin, MD 03/01/2018, 12:35 PM

## 2018-03-09 ENCOUNTER — Ambulatory Visit
Admission: RE | Admit: 2018-03-09 | Discharge: 2018-03-09 | Disposition: A | Discharge status: Home / Self Care | Payer: BLUE CROSS/BLUE SHIELD | Source: Ambulatory Visit | Attending: Nurse Practitioner | Admitting: Nurse Practitioner

## 2018-03-09 DIAGNOSIS — M542 Cervicalgia: Secondary | ICD-10-CM

## 2018-03-09 DIAGNOSIS — M50222 Other cervical disc displacement at C5-C6 level: Secondary | ICD-10-CM | POA: Insufficient documentation

## 2018-05-02 ENCOUNTER — Ambulatory Visit (HOSPITAL_COMMUNITY): Payer: Self-pay | Admitting: Psychiatry

## 2018-05-05 ENCOUNTER — Ambulatory Visit (HOSPITAL_COMMUNITY): Payer: BLUE CROSS/BLUE SHIELD | Admitting: Psychiatry

## 2018-05-17 ENCOUNTER — Ambulatory Visit (INDEPENDENT_AMBULATORY_CARE_PROVIDER_SITE_OTHER): Payer: BLUE CROSS/BLUE SHIELD | Admitting: Psychiatry

## 2018-05-17 ENCOUNTER — Encounter (HOSPITAL_COMMUNITY): Payer: Self-pay | Admitting: Psychiatry

## 2018-05-17 VITALS — BP 134/81 | HR 74 | Ht 63.5 in | Wt 154.4 lb

## 2018-05-17 DIAGNOSIS — F331 Major depressive disorder, recurrent, moderate: Secondary | ICD-10-CM

## 2018-05-17 DIAGNOSIS — F4312 Post-traumatic stress disorder, chronic: Secondary | ICD-10-CM | POA: Diagnosis not present

## 2018-05-17 MED ORDER — BREXPIPRAZOLE 1 MG PO TABS: 1.0000 mg | ORAL_TABLET | ORAL | 0 refills | Status: DC

## 2018-05-17 NOTE — Progress Notes (Signed)
BH MD/PA/NP OP Progress Note  05/17/2018 3:17 PM Erika Williamson  MRN:  [id number]  Chief Complaint: going through depresion   HPI: Erika Williamson reports a flare in depressive symptoms.  She continues on Paxil and trazodone in addition to Lamictal for augmentation.  We agreed to augment with Rexulti.  She has previously failed Abilify, Seroquel.  Denies any acute safety issues.  Agrees to follow-up in 4 weeks, or sooner if needed.  Visit Diagnosis:    ICD-10-CM   1. Depression, major, recurrent, moderate ([place]) F33.1   2. MDD (major depressive disorder), recurrent episode, moderate (HCC) F33.1   3. Chronic post-traumatic stress disorder (PTSD) F43.12     Past Psychiatric History: See intake H&P for full details. Reviewed, with no updates at this time.  Past Medical History:  Past Medical History:  Diagnosis Date  . 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 Anxiety   . Atypical face pain    Left Sided  . Depression   . Family history of adverse reaction to anesthesia    dad allergic to novacaine  . Foot fracture Left X2  . GERD (gastroesophageal reflux disease)   . HPV (human papilloma virus) infection   . Lupus ([place])   . Migraine without aura 04/03/2016  . Moderate recurrent major depression ([place]) 07/22/2015  . Occipital neuralgia of right side   . Shingles   . Sinusitis   . Tension headache   . Tobacco abuse   . Tonsillitis   . Vitamin D deficiency disease     Past Surgical History:  Procedure Laterality Date  . birth control removed  06/09/2017  . CESAREAN SECTION     X2  . CESAREAN SECTION    . CHOLECYSTECTOMY     Dr Burt Knack  . COLONOSCOPY WITH PROPOFOL N/A 05/03/2015   Procedure: COLONOSCOPY WITH PROPOFOL;  Surgeon: Josefine Class, MD;  Location[facility] ENDOSCOPY;  Service: Endoscopy;  Laterality: N/A;  . DILATION AND CURETTAGE OF UTERUS    . DILITATION & CURRETTAGE/HYSTROSCOPY WITH NOVASURE  ABLATION N/A 08/24/2017   Procedure: DILATATION & CURETTAGE/HYSTEROSCOPY WITH NOVASURE ABLATION;  Surgeon: Rubie Maid, MD;  Location: [facility] ORS;  Service: Gynecology;  Laterality: N/A;  . DILITATION & CURRETTAGE/HYSTROSCOPY WITH THERMACHOICE ABLATION N/A 09/22/2017   Procedure: DILATATION & CURETTAGE/HYSTEROSCOPY WITH BOSTON SCIENTIFIC GENESYS HTA  [place];  Surgeon: Rubie Maid, MD;  Location: [facility] ORS;  Service: Gynecology;  Laterality: N/A;  . ESOPHAGOGASTRODUODENOSCOPY N/A 05/03/2015   Procedure: ESOPHAGOGASTRODUODENOSCOPY (EGD);  Surgeon: Josefine Class, MD;  Location: [facility] ENDOSCOPY;  Service: Endoscopy;  Laterality: N/A;  . 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 TONSILLECTOMY    . TUBAL LIGATION    . WISDOM TOOTH EXTRACTION      Family Psychiatric History: See intake H&P for full details. Reviewed, with no updates at this time.   Family History:  Family History  Problem Relation Age of Onset  . Diabetes Mother   . Hypertension Mother   . COPD Mother   . Stroke Mother   . Osteoporosis Mother   . Depression Mother   . Cancer Father 59       colorectal, spread to lung  . Alcohol abuse Father   . Hypertension Father   . Depression Father   . Diabetes Sister   . COPD Sister   . Anxiety disorder Sister   . Depression Sister   . Fibromyalgia Sister   .  Asthma Sister   . ADD / ADHD Son   . ADD / ADHD Daughter     Social History:  Social History   Socioeconomic History  . Marital status: Married    Spouse name: Not on file  . Number of children: Not on file  . Years of education: Not on file  . Highest education level: Not on file  Occupational History  . Not on file  Social Needs  . Financial resource strain: Not on file  . Food insecurity:    Worry: Not on file    Inability: Not on file  . Transportation needs:    Medical: Not on file    Non-medical: Not on file  Tobacco Use  . Smoking status: Current Every Day Smoker    Packs/day: 1.00    Years: 22.00     Pack years: 22.00    Types: Cigarettes    Start date: 09/03/1993  . Smokeless tobacco: Never Used  Substance and Sexual Activity  . Alcohol use: No    Alcohol/week: 0.0 oz  . Drug use: No  . Sexual activity: Yes    Partners: Male    Birth control/protection: None  Lifestyle  . Physical activity:    Days per week: Not on file    Minutes per session: Not on file  . Stress: Not on file  Relationships  . Social connections:    Talks on phone: Not on file    Gets together: Not on file    Attends religious service: Not on file    Active member of club or organization: Not on file    Attends meetings of clubs or organizations: Not on file    Relationship status: Not on file  Other Topics Concern  . Not on file  Social History Narrative  . Not on file    Allergies:  Allergies  Allergen Reactions  . Gabapentin Anaphylaxis  . Ivp Dye [Iodinated Diagnostic Agents] Anaphylaxis  . Ketorolac Anaphylaxis  . Metrizamide Anaphylaxis  . Naprosyn [Naproxen] Anaphylaxis  . Nsaids Anaphylaxis  . Tolmetin Anaphylaxis  . Tramadol Anaphylaxis  . Erythromycin Other (See Comments)    Flu like symptoms  . Famciclovir Other (See Comments)    unknown  . Levofloxacin Other (See Comments)    Muscle aches and contractions   . Seroquel [Quetiapine Fumarate] Hives  . Compazine [Prochlorperazine Edisylate] Rash and Other (See Comments)    Muscle aches  . Pristiq [Desvenlafaxine] Rash and Other (See Comments)    Muscle aches    Metabolic Disorder Labs: No results found for: HGBA1C, MPG No results found for: PROLACTIN Lab Results  Component Value Date   CHOL 214 (H) 09/29/2017   Lab Results  Component Value Date   TSH 1.670 09/29/2017   TSH 1.710 07/22/2015    Therapeutic Level Labs: No results found for: LITHIUM No results found for: VALPROATE No components found for:  CBMZ  Current Medications: Current Outpatient Medications  Medication Sig Dispense Refill  . acetaminophen  (TYLENOL) 500 MG tablet Take 1,000 mg by mouth every 6 (six) hours as needed for moderate pain.    . baclofen (LIORESAL) 10 MG tablet Take 10 mg by mouth 3 (three) times daily.     . cetirizine (ZYRTEC) 10 MG tablet Take 10 mg by mouth daily.    . halobetasol (ULTRAVATE) 0.05 % ointment Apply 1 application topically as needed for rash.    Marland Kitchen HYDROcodone-acetaminophen (NORCO/VICODIN) 5-325 MG tablet Take 1-2 tablets by mouth every  6 (six) hours as needed for moderate pain. 20 tablet 0  . hydrocortisone 2.5 % cream Apply 1 application topically as needed (rash). On face    . hydrOXYzine (ATARAX/VISTARIL) 10 MG tablet Take 1 tablet (10 mg total) by mouth 3 (three) times daily as needed. 90 tablet 0  . lamoTRIgine (LAMICTAL) 200 MG tablet Take 1 tablet (200 mg total) by mouth daily. 90 tablet 1  . methocarbamol (ROBAXIN) 750 MG tablet Take 750 mg by mouth 2 (two) times daily.    . naltrexone (DEPADE) 50 MG tablet Take 1 tablet (50 mg total) by mouth daily. Take 1/2 tablet for 1 week, then increase to 1 tablet 90 tablet 1  . PARoxetine (PAXIL) 40 MG tablet Take 1 tablet (40 mg total) by mouth daily. 90 tablet 1  . rizatriptan (MAXALT) 10 MG tablet Take 10 mg by mouth as needed for migraine.     . traZODone (DESYREL) 50 MG tablet Take 1 tablet (50 mg total) by mouth at bedtime as needed for sleep. Take 1-2 tablets at bedtime as needed 90 tablet 0  . trolamine salicylate (ASPERCREME) 10 % cream Apply 1 application topically as needed (neck pain).    . Vitamin D, Ergocalciferol, (DRISDOL) 50000 units CAPS capsule Take 1 capsule (50,000 Units total) by mouth every 7 (seven) days. 8 capsule 0   No current facility-administered medications for this visit.      Musculoskeletal: Strength & Muscle Tone: within normal limits Gait & Station: normal Patient leans: N/A  Psychiatric Specialty Exam: ROS  Blood pressure 134/81, pulse 74, height 5' 3.5" (1.613 m), weight 154 lb 6.4 oz (70 kg), SpO2 96 %.Body  mass index is 26.92 kg/m.  General Appearance: Casual and Well Groomed  Eye Contact:  Fair  Speech:  Clear and Coherent and Normal Rate  Volume:  Normal  Mood:  Depressed and Dysphoric  Affect:  Congruent  Thought Process:  Coherent and Descriptions of Associations: Intact  Orientation:  Full (Time, Place, and Person)  Thought Content: Logical   Suicidal Thoughts:  No  Homicidal Thoughts:  No  Memory:  Immediate;   Good  Judgement:  Fair  Insight:  Fair  Psychomotor Activity:  Normal  Concentration:  Attention Span: Good  Recall:  Good  Fund of Knowledge: Good  Language: Good  Akathisia:  Negative  Handed:  Right  AIMS (if indicated): not done  Assets:  Communication Skills Desire for Improvement Financial Resources/Insurance Housing  ADL's:  Intact  Cognition: WNL  Sleep:  Fair   Screenings: PHQ2-9     Office Visit from 06/15/2016 in [facility] Office Visit from 04/03/2016 in [facility] Counselor from 02/26/2016 in [place] Office Visit from 08/07/2015 in [place] Visit from 07/31/2015 in [place]  PHQ-2 Total Score  4  3  6  4  6   PHQ-9 Total Score  15  16  27  13  21        Assessment and Plan:  Erika Williamson reports a flare in depressive symptoms in the context of some work stressors and ongoing development of her relationship with her boyfriend who is currently deployed.  She denies any acute safety issues, but does feel like she is more tearful, apathetic, anxious, and avoidant.  She feels like she is more irritable and she is not quite herself in terms of being her happy go lucky and sociable self.  We agreed to continue  Paxil and Lamictal as prescribed, and initiate augmentation with Rexulti, titrate to 1 mg as tolerated.  No acute safety issues and we will follow-up in 4-6 weeks.  1. Depression, major, recurrent, moderate ([place])   2. MDD (major depressive  disorder), recurrent episode, moderate ([place])   3. Chronic post-traumatic stress disorder (PTSD)     Status of current problems: gradually worsening  Labs Ordered: No orders of the defined types were placed in this encounter.   Labs Reviewed: n/a  Collateral Obtained/Records Reviewed: n/a  Plan:  Continue Paxil 40 mg daily, Lamictal 200 mg daily Initiate Rexulti, titrate to 1 mg as tolerated Continue naltrexone 50 mg daily for binge episode cravings Trazodone 50-100 mg nightly Return to clinic in 4-6 weeks   Aundra Dubin, MD 05/17/2018, 3:17 PM

## 2018-05-24 ENCOUNTER — Telehealth (HOSPITAL_COMMUNITY): Payer: Self-pay | Admitting: Psychiatry

## 2018-05-24 ENCOUNTER — Telehealth (HOSPITAL_COMMUNITY): Payer: Self-pay

## 2018-05-24 NOTE — Telephone Encounter (Signed)
Trazodone? But she has been on that for a while -  Id be concerned it was rexulti.  Perhaps the simplest thing to do for now, would be to stop both the rexulti and trazodone.  And instead she can continue paxil/lamictal and we can start a low dose of clonazepam 0.5 mg twice daily as needed to help her anxiety and mood symptoms settle down. She should know to take only exactly as prescribed at not more than twice a day.

## 2018-05-24 NOTE — Telephone Encounter (Signed)
D:  Pt phoned and left vm requesting returning to [facility].  Pt has had multiple prior admits (02-26-16, 09-05-16 and 05-03-17).  A:  Will contact patient to assess over the phone.  Will recommend PHP or [facility].  Discuss with Ricky Ala, NP and Dr. Daron Offer.

## 2018-05-24 NOTE — Telephone Encounter (Signed)
Patient is calling to let you know that the Trazodone is making her sick. She is throwing up and having acid reflux. It is not helping her sleep since she is so sick. She said this is increasing her anxiety  and now she has shingles coming up on her feet. Please review and advise, thank you

## 2018-05-24 NOTE — Telephone Encounter (Signed)
A:  Placed call to pt to discuss her request for returning to [place].  According to pt, she is "back into a slump."  "I've been wearing this mask and I'm not doing well; I just want to isolate myself and not do anything.  I am struggling a lot has happened since I was last there." Admits to vague SI.  Denies any plan/intent.  "I feel like the thoughts may come."   Pt states she has separated from her husband.  Inquired if patient has been seeing a therapist?  Pt states she hasn't seen a therapist because she never needed to go to one.  "I had been fine until now.  I just don't have time to go see a therapist due to my work schedule."  Explained to pt that she can schedule the therapist around her work schedule, whether morning, afternoon and evening.  Mentioned that some therapists have weekend appointments.  "I just don't have time.  My schedule at work changes on a daily basis." Expressed to patient that due to her prior admits (02-26-16, 08-26-16 and 05-03-17) the treatment team feels pt has exhausted all the skills that were taught in IOP.  It is recommended that pt contact [facility] IOP.  Will staff again with Dr. Daron Offer and Ricky Ala, NP per patient's request.

## 2018-05-24 NOTE — Telephone Encounter (Signed)
D:  Pt phoned writer wondering if she had called patient.  Expressed to pt that writer had spoken to Dr. Daron Offer and Ricky Ala, NP about MH-IOP; and they agreed that pt needs to follow through with seeing a therapist before returning to group.  According to pt, she saw Valora Piccolo, [facility] a few times, but didn't like it.  "I just can't do therapists.  I can't connect with any of them.  I just do better in group.  Anyway my work schedule will not allow me to see a therapist."  Informed pt again that therapists have morning, noon, evening and even weekend appointments.  Pt still states that she can't do it because she works nights.  Informed pt of her options:  Reach back out to therapist, attend groups at [facility] (flexible groups), or [facility].  Reiterated to pt that she has exhausted all MH-IOP skills and the staff doesn't know how else to assist her.   Pt then states for writer not to worry about it.  "I just won't do anything, I'm just going to keep at it, and continue working.  I would be fine if I just don't wake up."  Inquired if pt was suicidal.  Pt denies SI.  Denies a plan, means or intent.  Discussed safety options at length with pt. Inquired if patient wanted to go into the hospital.  Pt declined; stating she had a bad experience.  "I will never go back into the hospital." Told pt that writer would be telling Dr. Daron Offer about the phone conversation and she told writer not to worry about it.   A:  Inform Dr. Daron Offer.

## 2018-05-25 MED ORDER — CLONAZEPAM 0.5 MG PO TABS: 0.5000 mg | ORAL_TABLET | Freq: Two times a day (BID) | ORAL | 0 refills | Status: DC

## 2018-05-25 NOTE — Telephone Encounter (Signed)
I called in the Klonopin prescription and called patient to let her know.

## 2018-08-13 IMAGING — MR MR CERVICAL SPINE W/O CM
5 series · 33 of 48 positions shown · non-contrast
Comparison: None.

CLINICAL DATA: No known injury, headache and stiffness in the neck

EXAM:
MRI CERVICAL SPINE WITHOUT CONTRAST
TECHNIQUE: Multiplanar, multisequence MR imaging of the cervical spine was
performed. No intravenous contrast was administered.

[Series 2: T2 · sagittal · 3.0mm · 0.56mm/px · 6 of 13 slices shown (1 of 2)]
[im 1/13]
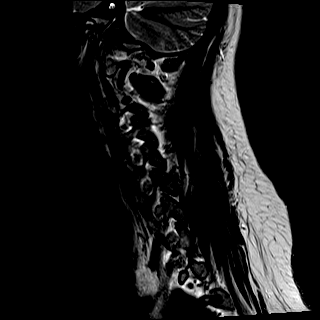
[im 3/13]
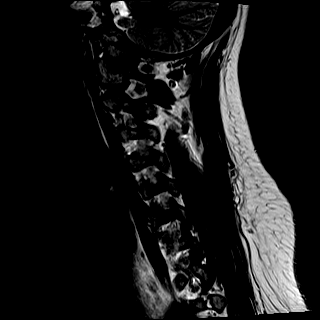
[im 5/13]
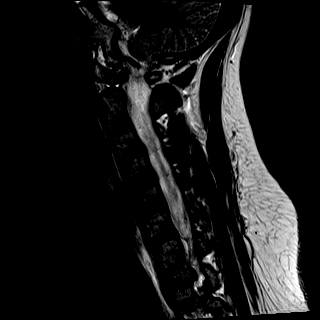
[im 8/13]
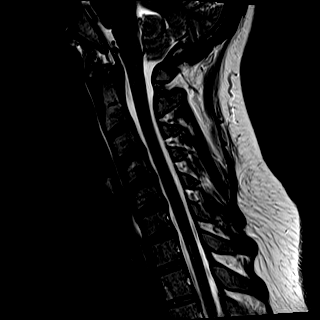
[im 10/13]
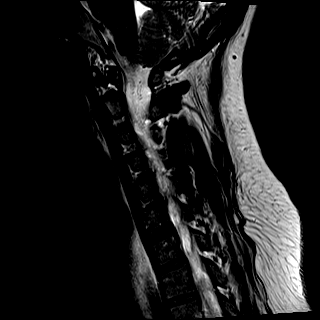
[im 13/13]
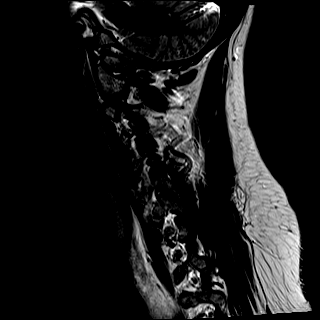

[Series 3: T1 · sagittal · 3.0mm · 0.70mm/px · 7 of 13 slices shown]
[im 1/13]
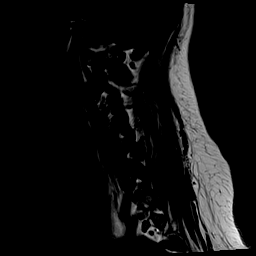
[im 3/13]
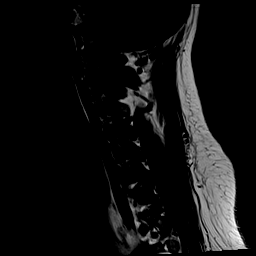
[im 5/13]
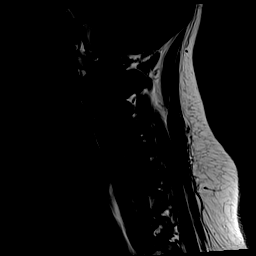
[im 7/13]
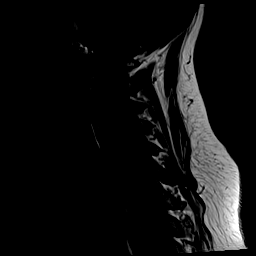
[im 9/13]
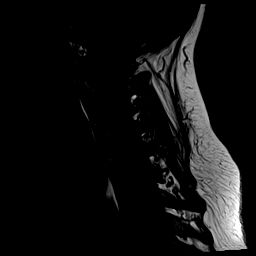
[im 11/13]
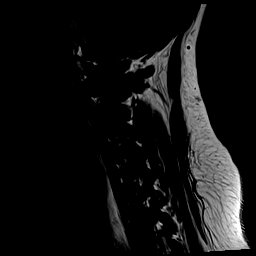
[im 13/13]
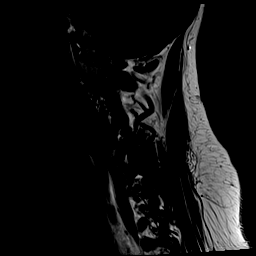

[Series 4: STIR · sagittal · 3.0mm · 0.35mm/px · 7 of 13 slices shown]
[im 1/13]
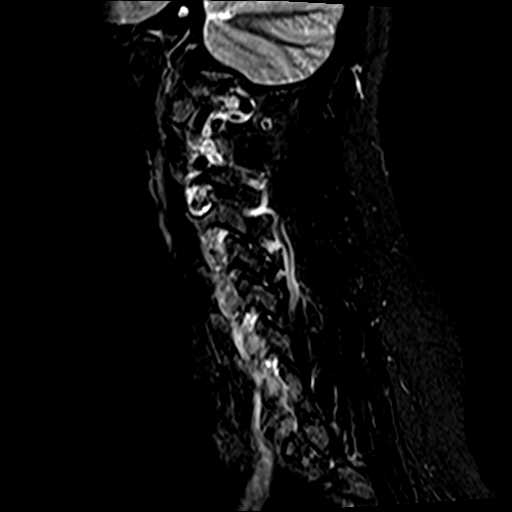
[im 3/13]
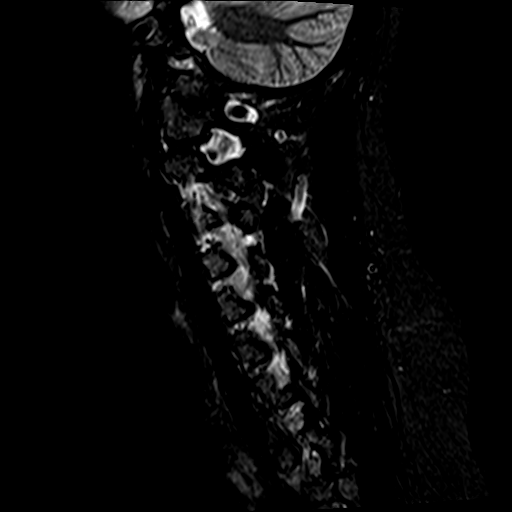
[im 5/13]
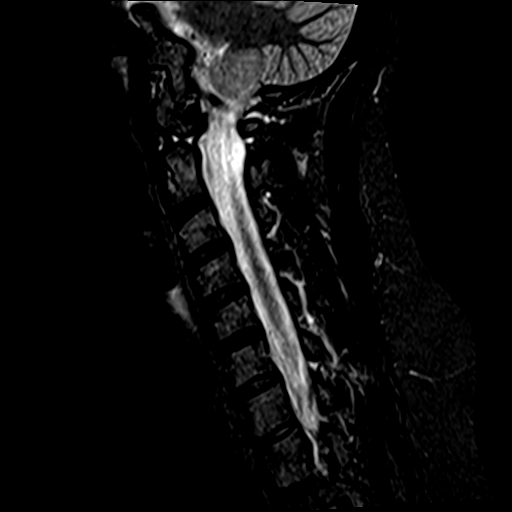
[im 7/13]
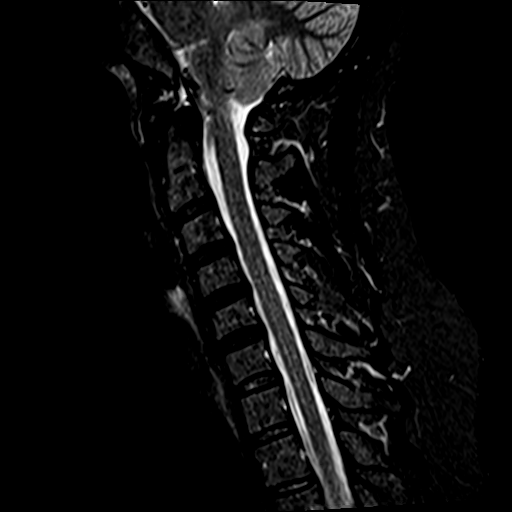
[im 9/13]
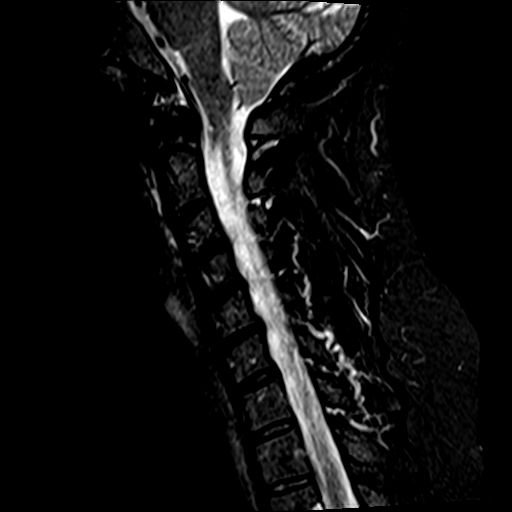
[im 11/13]
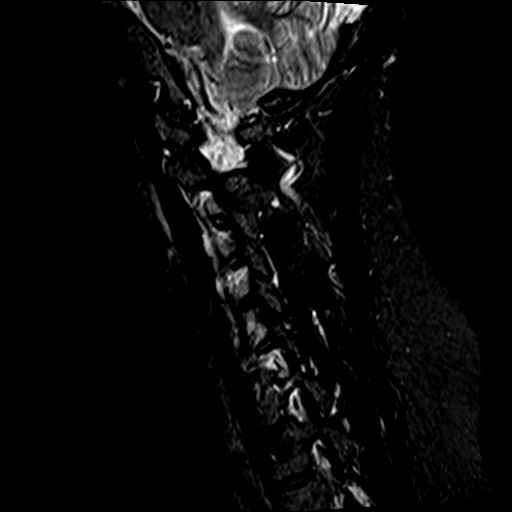
[im 13/13]
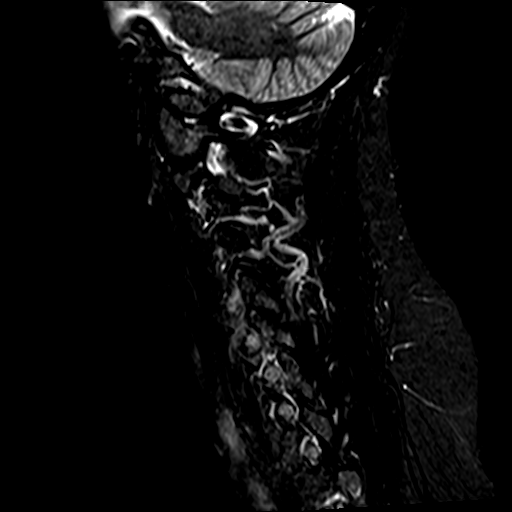

[Series 5: T2 · axial · 3.0mm · 0.62mm/px · z∈[-98,-8]mm · 8 of 26 slices shown (2 of 2)]
[im 1/26]
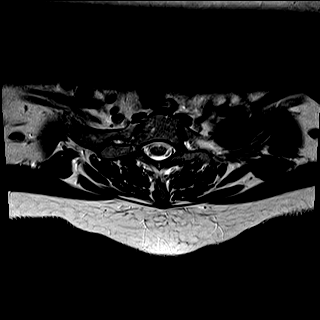
[im 4/26]
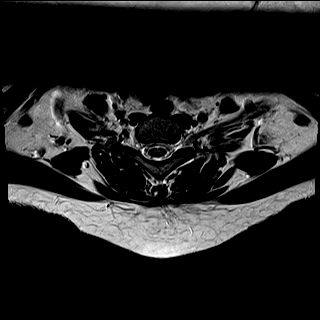
[im 8/26]
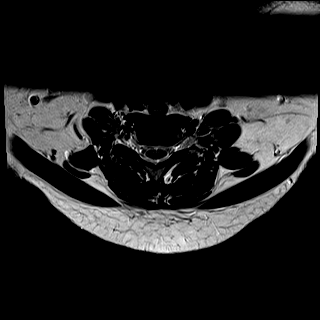
[im 12/26]
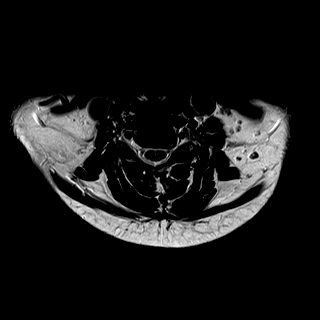
[im 14/26]
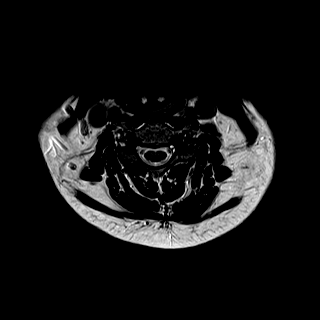
[im 18/26]
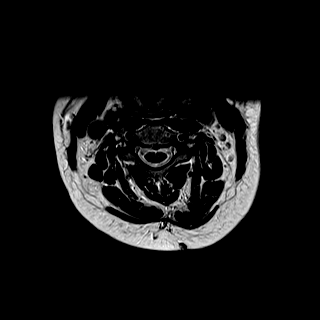
[im 22/26]
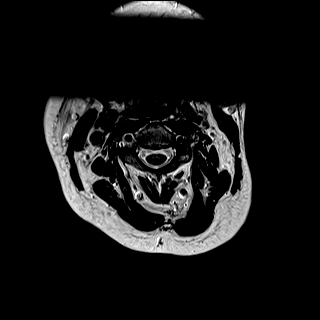
[im 26/26]
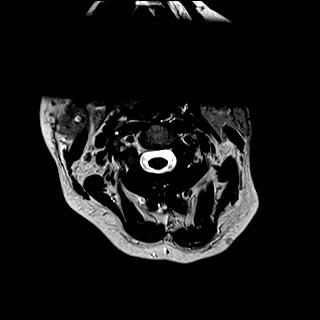

[Series 6: mpgr ax · axial · 3.0mm · 0.37mm/px · z∈[-87,-40]mm · 5 of 26 slices shown]
[im 1/26]
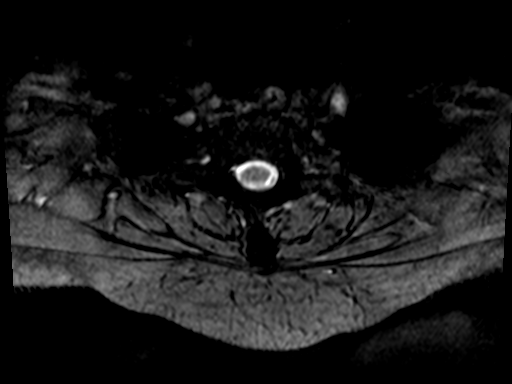
[im 4/26]
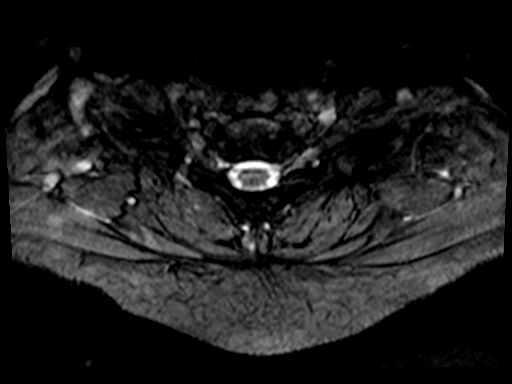
[im 8/26]
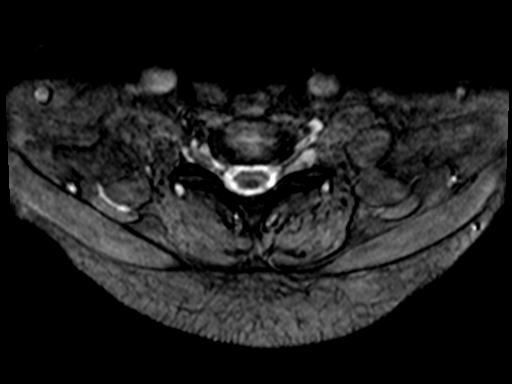
[im 12/26]
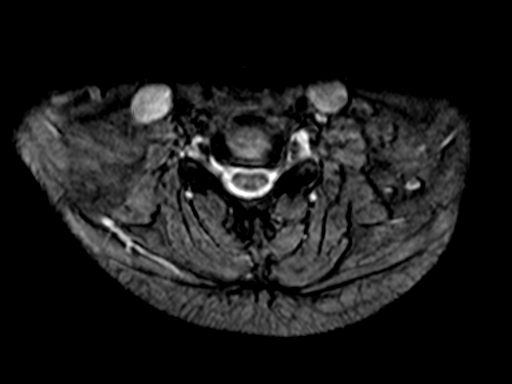
[im 14/26]
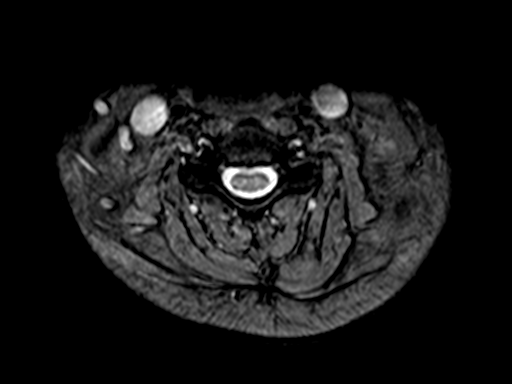

[33 of 48 positions shown; findings below may reference images not displayed]

FINDINGS: Alignment: Physiologic.

Vertebrae: No fracture, evidence of discitis, or bone lesion.

Cord: Normal signal and morphology.

Posterior Fossa, vertebral arteries, paraspinal tissues: Negative.

Disc levels:

Discs: Disc spaces are preserved.

C2-3: No significant disc bulge. No neural foraminal stenosis. No
central canal stenosis.

C3-4: No significant disc bulge. No neural foraminal stenosis. No
central canal stenosis.

C4-5: Minimal broad-based disc bulge at C4-5. No neural foraminal
stenosis. No central canal stenosis.

C5-6: Shallow left foraminal disc protrusion with mild left
foraminal stenosis. No right foraminal stenosis. No central canal
stenosis.

C6-7: No significant disc bulge. No neural foraminal stenosis. No
central canal stenosis.

C7-T1: No significant disc bulge. No neural foraminal stenosis. No
central canal stenosis.
IMPRESSION: 1. At C5-6 there is a shallow left foraminal disc protrusion with
mild left foraminal stenosis.

## 2018-10-10 ENCOUNTER — Encounter: Payer: Self-pay | Admitting: Family Medicine

## 2018-10-10 ENCOUNTER — Ambulatory Visit (INDEPENDENT_AMBULATORY_CARE_PROVIDER_SITE_OTHER): Payer: BLUE CROSS/BLUE SHIELD | Admitting: Family Medicine

## 2018-10-10 VITALS — BP 118/76 | Temp 98.5°F | Ht 63.5 in | Wt 163.4 lb

## 2018-10-10 DIAGNOSIS — J0101 Acute recurrent maxillary sinusitis: Secondary | ICD-10-CM | POA: Diagnosis not present

## 2018-10-10 DIAGNOSIS — F4312 Post-traumatic stress disorder, chronic: Secondary | ICD-10-CM

## 2018-10-10 MED ORDER — DOXYCYCLINE HYCLATE 100 MG PO TABS: 100.0000 mg | ORAL_TABLET | Freq: Two times a day (BID) | ORAL | 0 refills | Status: DC

## 2018-10-10 MED ORDER — PAROXETINE HCL 40 MG PO TABS: 50.0000 mg | ORAL_TABLET | Freq: Every day | ORAL | Status: DC

## 2018-10-10 NOTE — Progress Notes (Signed)
BP 118/76   Temp 98.5 F (36.9 C) (Oral)   Ht 5' 3.5" (1.613 m)   Wt 163 lb 6.4 oz (74.1 kg)   BMI 28.49 kg/m    Subjective:    Patient ID: Erika Williamson, female    DOB: [birth year], 42 y.o.   MRN: [id number]  HPI: Erika Williamson is a 42 y.o. female  Chief Complaint  Patient presents with  . Sinusitis    onset 3 weeks symptoms include: sinpressure, sore throat,dryness  . Ear Pain    bilateral    HPI Patient is here for an acute visit She has sinusitis; symptoms started 3 weeks ago; right side worse She has had these before and feels like this is sinusitis She has pressure across both cheeks; pain along the right upper teeth Also bilateral ear pain; no drainage except for white crust Almost always runs fever because of the lupus She has taken augmentin through doctor on demand, just one more day; not strong enough; sinus pressure and congestion medicine She has lupus Her psychiatrist has really changed up her medicine and she is doing so much better; went through a positive divorce earlier this year; has a supportive boyfriend who is deployed; medicine changes discussed and she lost 20 some pounds with one of them  Depression screen [facility] 2/9 10/10/2018 06/15/2016 04/03/2016 08/07/2015 07/31/2015  Decreased Interest 3 2 0 2 3  Down, Depressed, Hopeless 3 2 3 2 3   PHQ - 2 Score 6 4 3 4 6   Altered sleeping 3 3 3  0 0  Tired, decreased energy 3 2 3 2 3   Change in appetite 0 2 3 3 3   Feeling bad or failure about yourself  0 2 0 2 3  Trouble concentrating 0 1 3 0 3  Moving slowly or fidgety/restless 0 0 1 1 0  Suicidal thoughts 0 1 0 1 3  PHQ-9 Score 12 15 16 13 21   Difficult doing work/chores Not difficult at all Very difficult Extremely dIfficult Not difficult at all Very difficult  Some encounter information is confidential and restricted. Go to Review Flowsheets activity to see all data.   Fall Risk  10/10/2018 06/15/2016 04/03/2016 07/22/2015  Falls in the  past year? 0 No No No  Number falls in past yr: 0 - - -  Injury with Fall? 0 - - -    Relevant past medical, surgical, family and social history reviewed Past Medical History:  Diagnosis Date  . 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 Anxiety   . Atypical face pain    Left Sided  . Depression   . Family history of adverse reaction to anesthesia    dad allergic to novacaine  . Foot fracture Left X2  . GERD (gastroesophageal reflux disease)   . HPV (human papilloma virus) infection   . Lupus ([place])   . Migraine without aura 04/03/2016  . 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 Occipital neuralgia of right side   . Shingles   . Sinusitis   . Tension headache   . Tobacco abuse   . Tonsillitis   . Vitamin D deficiency disease    Past Surgical History:  Procedure Laterality Date  . birth control removed  06/09/2017  . CESAREAN SECTION     X2  . CESAREAN SECTION    . CHOLECYSTECTOMY     Dr Burt Knack  . COLONOSCOPY WITH PROPOFOL N/A 05/03/2015  Procedure: COLONOSCOPY WITH PROPOFOL;  Surgeon: Josefine Class, MD;  Location: [facility] ENDOSCOPY;  Service: Endoscopy;  Laterality: N/A;  . DILATION AND CURETTAGE OF UTERUS    . DILITATION & CURRETTAGE/HYSTROSCOPY WITH NOVASURE ABLATION N/A 08/24/2017   Procedure: DILATATION & CURETTAGE/HYSTEROSCOPY WITH NOVASURE ABLATION;  Surgeon: Rubie Maid, MD;  Location: [facility] ORS;  Service: Gynecology;  Laterality: N/A;  . DILITATION & CURRETTAGE/HYSTROSCOPY WITH THERMACHOICE ABLATION N/A 09/22/2017   Procedure: DILATATION & CURETTAGE/HYSTEROSCOPY WITH [employer]ES[employer] HTA  [place];  Surgeon: Rubie Maid, MD;  Location: [facility] ORS;  Service: Gynecology;  Laterality: N/A;  . ESOPHAGOGASTRODUODENOSCOPY N/A 05/03/2015   Procedure: ESOPHAGOGASTRODUODENOSCOPY (EGD);  Surgeon: Josefine Class, MD;  Location: [facility] ENDOSCOPY;  Service: Endoscopy;  Laterality: N/A;  . LEEP   10/05/2013   h/o CIN II  . TONSILLECTOMY    . TUBAL LIGATION    . WISDOM TOOTH EXTRACTION     Family History  Problem Relation Age of Onset  . Diabetes Mother   . Hypertension Mother   . COPD Mother   . Stroke Mother   . Osteoporosis Mother   . Depression Mother   . Cancer Father 15       colorectal, spread to lung  . Alcohol abuse Father   . Hypertension Father   . Depression Father   . Diabetes Sister   . COPD Sister   . Anxiety disorder Sister   . Depression Sister   . Fibromyalgia Sister   . Asthma Sister   . ADD / ADHD Son   . ADD / ADHD Daughter    Social History   Tobacco Use  . Smoking status: Current Every Day Smoker    Packs/day: 1.00    Years: 22.00    Pack years: 22.00    Types: Cigarettes    Start date: 09/03/1993  . Smokeless tobacco: Never Used  Substance Use Topics  . Alcohol use: No    Alcohol/week: 0.0 standard drinks  . Drug use: No     Office Visit from 10/10/2018 in [facility]  AUDIT-C Score  0     Interim medical history since last visit reviewed. Allergies and medications reviewed  Review of Systems Per HPI unless specifically indicated above     Objective:    BP 118/76   Temp 98.5 F (36.9 C) (Oral)   Ht 5' 3.5" (1.613 m)   Wt 163 lb 6.4 oz (74.1 kg)   BMI 28.49 kg/m   Wt Readings from Last 3 Encounters:  10/10/18 163 lb 6.4 oz (74.1 kg)  09/29/17 185 lb 4.8 oz (84.1 kg)  09/22/17 180 lb (81.6 kg)    Physical Exam Constitutional:      Appearance: She is well-developed. She is obese. She is not diaphoretic.  HENT:     Right Ear: Tympanic membrane normal.     Left Ear: Tympanic membrane normal.     Nose:     Right Sinus: No maxillary sinus tenderness.     Left Sinus: No maxillary sinus tenderness.  Eyes:     General: No scleral icterus. Cardiovascular:     Rate and Rhythm: Normal rate and regular rhythm.  Pulmonary:     Effort: Pulmonary effort is normal.     Breath sounds: Normal breath  sounds.  Lymphadenopathy:     Cervical: No cervical adenopathy.  Neurological:     Mental Status: She is alert.  Psychiatric:        Mood and Affect: Mood is  not anxious or depressed. Affect is not blunt, flat or tearful.        Behavior: Behavior normal.     Comments: Good eye contact with examiner       Assessment & Plan:   Problem List Items Addressed This Visit    None    Visit Diagnoses    Acute recurrent maxillary sinusitis    -  Primary   not improving with augmentin; allergic to levaquin; will treat with doxy; encouraged patient (by phone after visit) to quit smoking; I am here to help if needed   Relevant Medications   doxycycline (VIBRA-TABS) 100 MG tablet   Chronic post-traumatic stress disorder (PTSD)       doing well with current treatment with psychiatrist   Relevant Medications   PARoxetine (PAXIL) 40 MG tablet       Follow up plan: No follow-ups on file.  An after-visit summary was printed and given to the patient at [place].  Please see the patient instructions which may contain other information and recommendations beyond what is mentioned above in the assessment and plan.  Meds ordered this encounter  Medications  . PARoxetine (PAXIL) 40 MG tablet    Sig: Take 1.5 tablets (60 mg total) by mouth daily.  Marland Kitchen doxycycline (VIBRA-TABS) 100 MG tablet    Sig: Take 1 tablet (100 mg total) by mouth 2 (two) times daily.    Dispense:  20 tablet    Refill:  0    No orders of the defined types were placed in this encounter.

## 2018-10-10 NOTE — Patient Instructions (Addendum)
Start the new antibiotics Please do eat yogurt or kimchi or take a probiotic daily for the next month We want to replace the healthy germs in the gut If you notice foul, watery diarrhea in the next two months, schedule an appointment RIGHT AWAY or go to an urgent care or the emergency room if a holiday or over a weekend  Sinusitis, Adult Sinusitis is soreness and swelling (inflammation) of your sinuses. Sinuses are hollow spaces in the bones around your face. They are located:  Around your eyes.  In the middle of your forehead.  Behind your nose.  In your cheekbones. Your sinuses and nasal passages are lined with a fluid called mucus. Mucus drains out of your sinuses. Swelling can trap mucus in your sinuses. This lets germs (bacteria, virus, or fungus) grow, which leads to infection. Most of the time, this condition is caused by a virus. What are the causes? This condition is caused by:  Allergies.  Asthma.  Germs.  Things that block your nose or sinuses.  Growths in the nose (nasal polyps).  Chemicals or irritants in the air.  Fungus (rare). What increases the risk? You are more likely to develop this condition if:  You have a weak body defense system (immune system).  You do a lot of swimming or diving.  You use nasal sprays too much.  You smoke. What are the signs or symptoms? The main symptoms of this condition are pain and a feeling of pressure around the sinuses. Other symptoms include:  Stuffy nose (congestion).  Runny nose (drainage).  Swelling and warmth in the sinuses.  Headache.  Toothache.  A cough that may get worse at night.  Mucus that collects in the throat or the back of the nose (postnasal drip).  Being unable to smell and taste.  Being very tired (fatigue).  A fever.  Sore throat.  Bad breath. How is this diagnosed? This condition is diagnosed based on:  Your symptoms.  Your medical history.  A physical exam.  Tests to  find out if your condition is short-term (acute) or long-term (chronic). Your doctor may: ? Check your nose for growths (polyps). ? Check your sinuses using a tool that has a light (endoscope). ? Check for allergies or germs. ? Do imaging tests, such as an MRI or CT scan. How is this treated? Treatment for this condition depends on the cause and whether it is short-term or long-term.  If caused by a virus, your symptoms should go away on their own within 10 days. You may be given medicines to relieve symptoms. They include: ? Medicines that shrink swollen tissue in the nose. ? Medicines that treat allergies (antihistamines). ? A spray that treats swelling of the nostrils. ? Rinses that help get rid of thick mucus in your nose (nasal saline washes).  If caused by bacteria, your doctor may wait to see if you will get better without treatment. You may be given antibiotic medicine if you have: ? A very bad infection. ? A weak body defense system.  If caused by growths in the nose, you may need to have surgery. Follow these instructions at home: Medicines  Take, use, or apply over-the-counter and prescription medicines only as told by your doctor. These may include nasal sprays.  If you were prescribed an antibiotic medicine, take it as told by your doctor. Do not stop taking the antibiotic even if you start to feel better. Hydrate and humidify   Drink enough water to  keep your pee (urine) pale yellow.  Use a cool mist humidifier to keep the humidity level in your home above 50%.  Breathe in steam for 10-15 minutes, 3-4 times a day, or as told by your doctor. You can do this in the bathroom while a hot shower is running.  Try not to spend time in cool or dry air. Rest  Rest as much as you can.  Sleep with your head raised (elevated).  Make sure you get enough sleep each night. General instructions   Put a warm, moist washcloth on your face 3-4 times a day, or as often as told  by your doctor. This will help with discomfort.  Wash your hands often with soap and water. If there is no soap and water, use hand sanitizer.  Do not smoke. Avoid being around people who are smoking (secondhand smoke).  Keep all follow-up visits as told by your doctor. This is important. Contact a doctor if:  You have a fever.  Your symptoms get worse.  Your symptoms do not get better within 10 days. Get help right away if:  You have a very bad headache.  You cannot stop throwing up (vomiting).  You have very bad pain or swelling around your face or eyes.  You have trouble seeing.  You feel confused.  Your neck is stiff.  You have trouble breathing. Summary  Sinusitis is swelling of your sinuses. Sinuses are hollow spaces in the bones around your face.  This condition is caused by tissues in your nose that become inflamed or swollen. This traps germs. These can lead to infection.  If you were prescribed an antibiotic medicine, take it as told by your doctor. Do not stop taking it even if you start to feel better.  Keep all follow-up visits as told by your doctor. This is important. This information is not intended to replace advice given to you by your health care provider. Make sure you discuss any questions you have with your health care provider. Document Released: 03/23/2008 Document Revised: 03/07/2018 Document Reviewed: 03/07/2018 Elsevier Interactive Patient Education  2019 [employer].

## 2018-10-14 ENCOUNTER — Ambulatory Visit: Payer: Self-pay | Admitting: *Deleted

## 2018-10-14 NOTE — Telephone Encounter (Signed)
Pt notified. Stated she will call her ENT and psychiatrist on Monday and will stop the meds.

## 2018-10-14 NOTE — Telephone Encounter (Signed)
STOP doxycycline; REFER to ENT Have her call her PSYCHIATRIST about her feelings
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
## 2018-12-23 ENCOUNTER — Ambulatory Visit (INDEPENDENT_AMBULATORY_CARE_PROVIDER_SITE_OTHER): Payer: BLUE CROSS/BLUE SHIELD | Admitting: Obstetrics and Gynecology

## 2018-12-23 ENCOUNTER — Encounter: Payer: Self-pay | Admitting: Obstetrics and Gynecology

## 2018-12-23 VITALS — BP 120/77 | HR 88 | Ht 63.5 in | Wt 162.9 lb

## 2018-12-23 DIAGNOSIS — N939 Abnormal uterine and vaginal bleeding, unspecified: Secondary | ICD-10-CM

## 2018-12-23 DIAGNOSIS — Z9889 Other specified postprocedural states: Secondary | ICD-10-CM | POA: Diagnosis not present

## 2018-12-23 NOTE — Patient Instructions (Signed)
Abnormal Uterine Bleeding Abnormal uterine bleeding means bleeding more than usual from your uterus. It can include:  Bleeding between periods.  Bleeding after sex.  Bleeding that is heavier than normal.  Periods that last longer than usual.  Bleeding after you have stopped having your period (menopause). There are many problems that may cause this. You should see a doctor for any kind of bleeding that is not normal. Treatment depends on the cause of the bleeding. Follow these instructions at home:  Watch your condition for any changes.  Do not use tampons, douche, or have sex, if your doctor tells you not to.  Change your pads often.  Get regular well-woman exams. Make sure they include a pelvic exam and cervical cancer screening.  Keep all follow-up visits as told by your doctor. This is important. Contact a doctor if:  The bleeding lasts more than one week.  You feel dizzy at times.  You feel like you are going to throw up (nauseous).  You throw up. Get help right away if:  You pass out.  You have to change pads every hour.  You have belly (abdominal) pain.  You have a fever.  You get sweaty.  You get weak.  You passing large blood clots from your vagina. Summary  Abnormal uterine bleeding means bleeding more than usual from your uterus.  There are many problems that may cause this. You should see a doctor for any kind of bleeding that is not normal.  Treatment depends on the cause of the bleeding. This information is not intended to replace advice given to you by your health care provider. Make sure you discuss any questions you have with your health care provider. Document Released: 08/02/2009 Document Revised: 09/29/2016 Document Reviewed: 09/29/2016 Elsevier Interactive Patient Education  2019 Elsevier Inc.    Total Laparoscopic Hysterectomy A total laparoscopic hysterectomy is a minimally invasive surgery to remove the uterus and cervix. The  fallopian tubes and ovaries can also be removed (bilateral salpingo-oophorectomy) during this surgery, if necessary. This procedure may be done to treat problems such as:  Noncancerous growths in the uterus (uterine fibroids) that cause symptoms.  A condition that causes the lining of the uterus (endometrium) to grow in other areas (endometriosis).  Problems with pelvic support. This is caused by weakened muscles of the pelvis following vaginal childbirth or menopause.  Cancer of the cervix, ovaries, uterus, or endometrium.  Excessive (dysfunctional) uterine bleeding. This surgery is performed by inserting a thin, lighted tube (laparoscope) and surgical instruments into small incisions in the abdomen. The laparoscope sends images to a monitor. The images help the health care provider perform the procedure. After this procedure, you will no longer be able to have a baby, and you will no longer have a menstrual period. Tell a health care provider about:  Any allergies you have.  All medicines you are taking, including vitamins, herbs, eye drops, creams, and over-the-counter medicines.  Any problems you or family members have had with anesthetic medicines.  Any blood disorders you have.  Any surgeries you have had.  Any medical conditions you have.  Whether you are pregnant or may be pregnant. What are the risks? Generally, this is a safe procedure. However, problems may occur, including:  Infection.  Bleeding.  Blood clots in the legs or lungs.  Allergic reactions to medicines.  Damage to other structures or organs.  The risk that the surgery may have to be switched to the regular one in which a  large incision is made in the abdomen (abdominal hysterectomy). What happens before the procedure? Staying hydrated Follow instructions from your health care provider about hydration, which may include:  Up to 2 hours before the procedure - you may continue to drink clear liquids,  such as water, clear fruit juice, black coffee, and plain tea Eating and drinking restrictions Follow instructions from your health care provider about eating and drinking, which may include:  8 hours before the procedure - stop eating heavy meals or foods such as meat, fried foods, or fatty foods.  6 hours before the procedure - stop eating light meals or foods, such as toast or cereal.  6 hours before the procedure - stop drinking milk or drinks that contain milk.  2 hours before the procedure - stop drinking clear liquids. Medicines  Ask your health care provider about: ? Changing or stopping your regular medicines. This is especially important if you are taking diabetes medicines or blood thinners. ? Taking over-the-counter medicines, vitamins, herbs, and supplements. ? Taking medicines such as aspirin and ibuprofen. These medicines can thin your blood. Do not take these medicines unless your health care provider tells you to take them.  You may be given antibiotic medicine to help prevent infection.  You may be asked to take laxatives.  You may be given medicines to help prevent nausea and vomiting after the procedure. General instructions  Ask your health care provider how your surgical site will be marked or identified.  You may be asked to shower with a germ-killing soap.  Do not use any products that contain nicotine or tobacco, such as cigarettes and e-cigarettes. If you need help quitting, ask your health care provider.  You may have an exam or testing, such as an ultrasound to determine the size and shape of your pelvic organs.  You may have a blood or urine sample taken.  This procedure can affect the way you feel about yourself. Talk with your health care provider about the physical and emotional changes hysterectomy may cause.  Plan to have someone take you home from the hospital or clinic.  Plan to have a responsible adult care for you for at least 24 hours  after you leave the hospital or clinic. This is important. What happens during the procedure?  To lower your risk of infection: ? Your health care team will wash or sanitize their hands. ? Your skin will be washed with soap. ? Hair may be removed from the surgical area.  An IV will be inserted into one of your veins.  You will be given one or more of the following: ? A medicine to help you relax (sedative). ? A medicine to make you fall asleep (general anesthetic).  You will be given antibiotic medicine through your IV.  A tube may be inserted down your throat to help you breathe during the procedure.  A gas (carbon dioxide) will be used to inflate your abdomen to allow your surgeon to see inside of your abdomen.  Three or four small incisions will be made in your abdomen.  A laparoscope will be inserted into one of your incisions. Surgical instruments will be inserted through the other incisions in order to perform the procedure.  Your uterus and cervix may be removed through your vagina or cut into small pieces and removed through the small incisions. Any other organs that need to be removed will also be removed this way.  Carbon dioxide will be released from inside of  your abdomen.  Your incisions will be closed with stitches (sutures).  A bandage (dressing) may be placed over your incisions. The procedure may vary among health care providers and hospitals. What happens after the procedure?  Your blood pressure, heart rate, breathing rate, and blood oxygen level will be monitored until the medicines you were given have worn off.  You will be given medicine for pain and nausea as needed.  Do not drive for 24 hours if you received a sedative. Summary  Total Laparoscopic hysterectomy is a procedure to remove your uterus, cervix and sometimes the fallopian tubes and ovaries.  This procedure can affect the way you feel about yourself. Talk with your health care provider about  the physical and emotional changes hysterectomy may cause.  After this procedure, you will no longer be able to have a baby, and you will no longer have a menstrual period.  You will be given pain medicine to control discomfort after this procedure. This information is not intended to replace advice given to you by your health care provider. Make sure you discuss any questions you have with your health care provider. Document Released: 08/02/2007 Document Revised: 12/16/2016 Document Reviewed: 12/16/2016 Elsevier Interactive Patient Education  2019 [employer].

## 2018-12-23 NOTE — Progress Notes (Signed)
    GYNECOLOGY PROGRESS NOTE  Subjective:    Patient ID: Erika Williamson, female    DOB: 07/14/1976, 43 y.o.   MRN: [id number]  HPI  Patient is a 43 y.o. [id number] female who presents for complaints of abnormal uterine bleeding.  She notes that since November 2019 that she has had off and on spotting to light bleeding, usually occurring every 2-3 days.  She does have associated cramping (and pelvic pain mostly near C-section scar especially when bending or lifting). She has a history of IUD use in the past and endometrial ablation in 2018 for abnormal uterine bleeding.  Last pap smear was 07/2017 and was normal.    The following portions of the patient's history were reviewed and updated as appropriate: allergies, current medications, past family history, past medical history, past social history, past surgical history and problem list.    Review of Systems Pertinent items noted in HPI and remainder of comprehensive ROS otherwise negative.   Objective:   Blood pressure 120/77, pulse 88, height 5' 3.5" (1.613 m), weight 162 lb 14.4 oz (73.9 kg). General appearance: alert and no distress Abdomen: soft, non-tender; bowel sounds normal; no masses,  no organomegaly Pelvic: external genitalia normal, rectovaginal septum normal.  Vagina without discharge, scant thin brown blood in vault.  Cervix normal appearing, no lesions and no motion tenderness.  Uterus mobile, nontender, normal shape and size.  Adnexae non-palpable, nontender bilaterally.  Extremities: extremities normal, atraumatic, no cyanosis or edema Neurologic: Grossly normal   Labs:    Lab Results  Component Value Date   TSH 1.670 09/29/2017    Assessment:   Abnormal uterine bleeding S/p endometrial ablation  Plan:   - Patient has abnormal uterine bleeding . She has a normal exam, no evidence of lesions.  She had undergone workup previously prior to endometrial ablation in 2018 which was negative.  Discussed  possibility of inadequate ablation treatment, structural abnormalities (polyp, fibroid), or hormonal imbalance/deficiency as she is possibly perimenopausal. Discussed management options for abnormal uterine bleeding including tranexamic acid (Lysteda), oral progesterone, Depo Provera, repeating endometrial ablation or hysterectomy as definitive surgical management.  Discussed risks and benefits of each method.   Patient declines medical management.  Is unsure about repeating the ablation vs hysterectomy (leaning more towards hysterectomy but is concerned about recovery time and her job).  Printed patient education handouts were given to the patient to review at home.  - Will order pelvic ultrasound to reassess for structural causes as last ultrasound was in 2018.  May also require endometrial biopsy to r/o malignancy, however sampling may be limited due to prior history of ablation.  - Patient to f/u in 2-3 weeks after ultrasound.    Rubie Maid, MD [facility]

## 2018-12-23 NOTE — Progress Notes (Signed)
Pt present today for abnormal bleeding. Pt stated the it has been occurring since November 2019. Pt stated that she is having several cycles throughout the month,. Pt stated that her cycles can be long. Pt stated having spotting off and on, brown blood and bright red blood off and on along with cramping.

## 2019-01-05 ENCOUNTER — Other Ambulatory Visit: Payer: Self-pay

## 2019-01-05 ENCOUNTER — Emergency Department
Admission: EM | Admit: 2019-01-05 | Discharge: 2019-01-05 | Disposition: A | Discharge status: Home / Self Care | Payer: BLUE CROSS/BLUE SHIELD | Attending: Emergency Medicine | Admitting: Emergency Medicine

## 2019-01-05 ENCOUNTER — Encounter: Payer: Self-pay | Admitting: *Deleted

## 2019-01-05 DIAGNOSIS — J0100 Acute maxillary sinusitis, unspecified: Secondary | ICD-10-CM | POA: Diagnosis not present

## 2019-01-05 DIAGNOSIS — Z79899 Other long term (current) drug therapy: Secondary | ICD-10-CM | POA: Diagnosis not present

## 2019-01-05 DIAGNOSIS — J111 Influenza due to unidentified influenza virus with other respiratory manifestations: Secondary | ICD-10-CM | POA: Insufficient documentation

## 2019-01-05 DIAGNOSIS — F17210 Nicotine dependence, cigarettes, uncomplicated: Secondary | ICD-10-CM | POA: Insufficient documentation

## 2019-01-05 DIAGNOSIS — R6889 Other general symptoms and signs: Secondary | ICD-10-CM

## 2019-01-05 DIAGNOSIS — R05 Cough: Secondary | ICD-10-CM | POA: Diagnosis present

## 2019-01-05 LAB — INFLUENZA PANEL BY PCR (TYPE A & B)
Influenza A By PCR: NEGATIVE
Influenza B By PCR: NEGATIVE

## 2019-01-05 MED ORDER — AMOXICILLIN-POT CLAVULANATE 875-125 MG PO TABS: 1.0000 | ORAL_TABLET | Freq: Two times a day (BID) | ORAL | 0 refills | Status: AC

## 2019-01-05 NOTE — ED Provider Notes (Signed)
[facility] Emergency Department Provider Note  ____________________________________________  Time seen: Approximately 12:19 PM  I have reviewed the triage vital signs and the nursing notes.   HISTORY  Chief Complaint Influenza    HPI Erika Williamson is a 43 y.o. female presents emergency department for evaluation of nasal congestion, mouth pain, sore throat, swollen lymph nodes, nonproductive cough for 6 days.  Patient had a couple of episodes of diarrhea last week. Her mouth feels raw and the roof of her mouth is painful. Patient states that she has felt tired, like she has the flu.  No sick contacts.  No recent travel.  No known contact with covid 19.  She has taken over-the-counter medications for symptoms.  Patient works at [employer].  No shortness of breath, vomiting, abdominal pain.   Past Medical History:  Diagnosis Date  . 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 Anxiety   . Atypical face pain    Left Sided  . Depression   . Family history of adverse reaction to anesthesia    dad allergic to novacaine  . Foot fracture Left X2  . GERD (gastroesophageal reflux disease)   . HPV (human papilloma virus) infection   . Lupus ([place])   . Migraine without aura 04/03/2016  . 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 Occipital neuralgia of right side   . Shingles   . Sinusitis   . Tension headache   . Tobacco abuse   . Tonsillitis   . Vitamin D deficiency disease     Patient Active Problem List   Diagnosis Date Noted  . 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 Cat bite of forearm 06/17/2016  . Chronic obstructive pulmonary disease ([place]) 05/04/2016  . Contact dermatitis due to poison oak 04/03/2016  . Migraine without aura 04/03/2016  . NSAIDs adverse reaction 09/09/2015  . MDD (major depressive disorder), recurrent episode, moderate ([place]) 07/22/2015  . Gluten intolerance  06/19/2015  . Tobacco abuse   . Vitamin D deficiency disease   . HPV (human papilloma virus) infection   . High grade squamous intraepithelial cervical dysplasia   . GERD (gastroesophageal reflux disease)     Past Surgical History:  Procedure Laterality Date  . birth control removed  06/09/2017  . CESAREAN SECTION     X2  . CHOLECYSTECTOMY     Dr Burt Knack  . COLONOSCOPY WITH PROPOFOL N/A 05/03/2015   Procedure: COLONOSCOPY WITH PROPOFOL;  Surgeon: Josefine Class, MD;  Location: [facility] ENDOSCOPY;  Service: Endoscopy;  Laterality: N/A;  . DILITATION & CURRETTAGE/HYSTROSCOPY WITH NOVASURE ABLATION N/A 08/24/2017   Procedure: DILATATION & CURETTAGE/HYSTEROSCOPY (NO ABLATION DONE);  Surgeon: Rubie Maid, MD;  Location: [facility] ORS;  Service: Gynecology;  Laterality: N/A;  . DILITATION & CURRETTAGE/HYSTROSCOPY WITH THERMACHOICE ABLATION N/A 09/22/2017   Procedure: DILATATION & CURETTAGE/HYSTEROSCOPY WITH BOSTON SCIENT[employer] GENESYS HTA  [place];  Surgeon: Rubie Maid, MD;  Location: [facility] ORS;  Service: Gynecology;  Laterality: N/A;  . ESOPHAGOGASTRODUODENOSCOPY N/A 05/03/2015   Procedure: ESOPHAGOGASTRODUODENOSCOPY (EGD);  Surgeon: Josefine Class, MD;  Location: [facility] ENDOSCOPY;  Service: Endoscopy;  Laterality: N/A;  . 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 TONSILLECTOMY    . TUBAL LIGATION    . WISDOM TOOTH EXTRACTION      Prior to Admission medications   Medication Sig Start Date End Date Taking? Authorizing Provider  acetaminophen (TYLENOL) 500 MG tablet Take 1,000 mg by mouth  every 6 (six) hours as needed for moderate pain.    [provider]  amoxicillin-clavulanate (AUGMENTIN) 875-125 MG tablet Take 1 tablet by mouth 2 (two) times daily for 10 days. 01/05/19 01/15/19  Laban Emperor, PA-C  baclofen (LIORESAL) 10 MG tablet Take 10 mg by mouth 3 (three) times daily.     [provider]  cetirizine (ZYRTEC) 10 MG tablet Take 10 mg by mouth as needed.     [provider]  cholecalciferol (VITAMIN D) 1000 units tablet Take 5,000 Units by mouth once a week.    [provider]  halobetasol (ULTRAVATE) 0.05 % ointment Apply 1 application topically as needed for rash. 07/26/17   [provider]  hydrocortisone 2.5 % cream Apply 1 application topically as needed (rash). On face    [provider]  lamoTRIgine (LAMICTAL) 200 MG tablet Take 1 tablet (200 mg total) by mouth daily. 03/01/18 03/01/19  Aundra Dubin, MD  methocarbamol (ROBAXIN) 750 MG tablet Take 750 mg by mouth daily.     [provider]  PARoxetine (PAXIL) 40 MG tablet Take 1.5 tablets (60 mg total) by mouth daily. 10/10/18 10/10/19  Arnetha Courser, MD  rizatriptan (MAXALT) 10 MG tablet Take 10 mg by mouth as needed for migraine.  03/14/15   [provider]  trolamine salicylate (ASPERCREME) 10 % cream Apply 1 application topically as needed (neck pain).    [provider]    Allergies Gabapentin; Ivp dye [iodinated diagnostic agents]; Ketorolac; Metrizamide; Naprosyn [naproxen]; Nsaids; Tolmetin; Tramadol; Erythromycin; Famciclovir; Levofloxacin; Seroquel [quetiapine fumarate]; Compazine [prochlorperazine edisylate]; and Pristiq [desvenlafaxine]  Family History  Problem Relation Age of Onset  . Diabetes Mother   . Hypertension Mother   . COPD Mother   . Stroke Mother   . Osteoporosis Mother   . Depression Mother   . Cancer Father 78       colorectal, spread to lung  . Alcohol abuse Father   . Hypertension Father   . Depression Father   . Diabetes Sister   . COPD Sister   . Anxiety disorder Sister   . Depression Sister   . Fibromyalgia Sister   . Asthma Sister   . ADD / ADHD Son   . ADD / ADHD Daughter     Social History Social History   Tobacco Use  . Smoking status: Current Every Day Smoker    Packs/day: 1.00    Years: 22.00    Pack years: 22.00    Types: Cigarettes    Start date: 09/03/1993  . Smokeless  tobacco: Never Used  Substance Use Topics  . Alcohol use: No    Alcohol/week: 0.0 standard drinks  . Drug use: No     Review of Systems  Constitutional: No fever/chills Eyes: No visual changes. No discharge. ENT: Positive for congestion and rhinorrhea. Cardiovascular: No chest pain. Respiratory: Positive for cough. No SOB. Gastrointestinal: No abdominal pain.  No nausea, no vomiting.  No diarrhea.  No constipation. Musculoskeletal: Negative for musculoskeletal pain. Skin: Negative for rash, abrasions, lacerations, ecchymosis. Neurological: Negative for headaches.   ____________________________________________   PHYSICAL EXAM:  VITAL SIGNS: ED Triage Vitals  Enc Vitals Group     BP 01/05/19 1141 137/76     Pulse Rate 01/05/19 1141 85     Resp 01/05/19 1141 18     Temp 01/05/19 1141 99.3 F (37.4 C)     Temp Source 01/05/19 1141 Oral     SpO2 01/05/19 1141  99 %     Weight 01/05/19 1141 160 lb (72.6 kg)     Height 01/05/19 1141 5\' 4"  (1.626 m)     Head Circumference --      Peak Flow --      Pain Score 01/05/19 1146 4     Pain Loc --      Pain Edu? --      Excl. in [place]? --      Constitutional: Alert and oriented. Well appearing and in no acute distress. Eyes: Conjunctivae are normal. PERRL. EOMI. No discharge. Head: Atraumatic. ENT: No frontal and maxillary sinus tenderness.      Ears: Tympanic membranes pearly gray with good landmarks. No discharge.      Nose: Mild congestion/rhinnorhea.      Mouth/Throat: Mucous membranes are moist. Oropharynx non-erythematous. Tonsils not enlarged. No exudates. Uvula midline. Neck: No stridor.   Hematological/Lymphatic/Immunilogical: No cervical lymphadenopathy. Cardiovascular: Normal rate, regular rhythm.  Good peripheral circulation. Respiratory: Normal respiratory effort without tachypnea or retractions. Lungs CTAB. Good air entry to the bases with no decreased or absent breath sounds. Gastrointestinal: Bowel sounds 4  quadrants. Soft and nontender to palpation. No guarding or rigidity. No palpable masses. No distention. Musculoskeletal: Full range of motion to all extremities. No gross deformities appreciated. Neurologic:  Normal speech and language. No gross focal neurologic deficits are appreciated.  Skin:  Skin is warm, dry and intact. No rash noted. Psychiatric: Mood and affect are normal. Speech and behavior are normal. Patient exhibits appropriate insight and judgement.   ____________________________________________   LABS (all labs ordered are listed, but only abnormal results are displayed)  Labs Reviewed  INFLUENZA PANEL BY PCR (TYPE A & B)   ____________________________________________  EKG   ____________________________________________  RADIOLOGY   No results found.  ____________________________________________    PROCEDURES  Procedure(s) performed:    Procedures    Medications - No data to display   ____________________________________________   INITIAL IMPRESSION / ASSESSMENT AND PLAN / ED COURSE  Pertinent labs & imaging results that were available during my care of the patient were reviewed by me and considered in my medical decision making (see chart for details).  Review of the  [place]RS was performed in accordance of the [place] prior to dispensing any controlled drugs.     Patient presents emergency department for evaluation of influenza-like symptoms that have been worsening for 6 days.  Vital signs and exam are reassuring.  Influenza test is negative.  Facial pain and sore throat are worsening so she will be covered for a bacterial cause with Augmentin patient appears well and is staying well hydrated. Patient should alternate tylenol and ibuprofen for fever. Patient feels comfortable going home. Patient will be discharged home with prescriptions for Augmentin. Patient is to follow up with primary care as needed or otherwise directed. Patient is given ED  precautions to return to the ED for any worsening or new symptoms.     ____________________________________________  FINAL CLINICAL IMPRESSION(S) / ED DIAGNOSES  Final diagnoses:  Flu-like symptoms  Acute non-recurrent maxillary sinusitis      NEW MEDICATIONS STARTED DURING THIS VISIT:  ED Discharge Orders         Ordered    amoxicillin-clavulanate (AUGMENTIN) 875-125 MG tablet  2 times daily     01/05/19 1335              This chart was dictated using voice recognition software/Dragon. Despite best efforts to proofread, errors can occur which can change  the meaning. Any change was purely unintentional.    Laban Emperor, PA-C 01/06/19 1323    Nena Polio, MD 01/06/19 [phone]

## 2019-01-05 NOTE — ED Triage Notes (Signed)
Pt to ED reporting cough, sinus congestion, ear pain, sore throat, fatigue, and generalized body aches. No international travel, no known exposure to COVID,  No fevers at home and afebrile in ED. No SOB reported and no increased WOB noted.

## 2019-01-09 ENCOUNTER — Other Ambulatory Visit: Payer: Self-pay | Admitting: Obstetrics and Gynecology

## 2019-01-09 DIAGNOSIS — N926 Irregular menstruation, unspecified: Secondary | ICD-10-CM

## 2019-01-11 ENCOUNTER — Telehealth: Payer: Self-pay

## 2019-01-11 NOTE — Telephone Encounter (Signed)
Pt was called to speak to her concerning her appointment tomorrow. Pt has an appt to see [facility] and an [place]. Pt stated that she was not sure if wanted to come due to the reason for her appointment was to speak with [facility] about during an hysterectomy. Pt stated that she wanted her appointments canceled for now. She wanted to wait about a month to see if the surgery restrictions be removed and she would call back and reschedule those appts.

## 2019-01-11 NOTE — Telephone Encounter (Signed)
[place] needs to be prescreened.

## 2019-01-12 ENCOUNTER — Other Ambulatory Visit: Payer: BLUE CROSS/BLUE SHIELD

## 2019-01-16 ENCOUNTER — Ambulatory Visit: Payer: Self-pay

## 2019-01-17 ENCOUNTER — Encounter: Payer: Self-pay | Admitting: Obstetrics and Gynecology

## 2019-01-18 ENCOUNTER — Encounter: Payer: Self-pay | Admitting: Obstetrics and Gynecology

## 2019-01-18 ENCOUNTER — Telehealth: Payer: Self-pay | Admitting: Obstetrics and Gynecology

## 2019-01-18 NOTE — Telephone Encounter (Signed)
The patient called and stated that she was advised to take tylenol for her cramping. The patient stated that she is unable to find tylenol anywhere. The patient is requesting Dr. Marcelline Mates send a script of a medication similar or the same as tylenol. The patient stated her cramping is getting worse as well. Pt stated she is working today and it is okay to leave a detailed message if she can not answer the phone. No other information was disclosed. Please advise.

## 2019-01-19 MED ORDER — ACETAMINOPHEN 500 MG PO TABS: 500.0000 mg | ORAL_TABLET | Freq: Four times a day (QID) | ORAL | 0 refills | Status: DC | PRN

## 2019-01-19 NOTE — Telephone Encounter (Signed)
Pt was called and informed that I would speak to [facility] so she could call her in some medication that is the same as Tylenol since she is not able to find any Tylenol.

## 2019-01-20 ENCOUNTER — Other Ambulatory Visit: Payer: Self-pay | Admitting: Obstetrics and Gynecology

## 2019-01-20 MED ORDER — ACETAMINOPHEN 500 MG PO TABS: 500.0000 mg | ORAL_TABLET | Freq: Four times a day (QID) | ORAL | 0 refills | Status: DC | PRN

## 2019-01-20 MED ORDER — ACETAMINOPHEN ER 650 MG PO TBCR: 1300.0000 mg | EXTENDED_RELEASE_TABLET | Freq: Three times a day (TID) | ORAL | 1 refills | Status: AC | PRN

## 2019-01-20 NOTE — Telephone Encounter (Signed)
Pt called and informed that [facility] had sent in Tylenol 650 mg to her pharmacy.

## 2019-02-20 ENCOUNTER — Ambulatory Visit (INDEPENDENT_AMBULATORY_CARE_PROVIDER_SITE_OTHER): Payer: BLUE CROSS/BLUE SHIELD | Admitting: Nurse Practitioner

## 2019-02-20 ENCOUNTER — Telehealth: Payer: Self-pay | Admitting: Nurse Practitioner

## 2019-02-20 ENCOUNTER — Other Ambulatory Visit: Payer: Self-pay

## 2019-02-20 ENCOUNTER — Encounter: Payer: Self-pay | Admitting: Nurse Practitioner

## 2019-02-20 VITALS — BP 128/76 | HR 94 | Temp 96.7°F | Resp 16 | Ht 63.5 in | Wt 162.3 lb

## 2019-02-20 DIAGNOSIS — R110 Nausea: Secondary | ICD-10-CM

## 2019-02-20 DIAGNOSIS — H6123 Impacted cerumen, bilateral: Secondary | ICD-10-CM | POA: Diagnosis not present

## 2019-02-20 DIAGNOSIS — H938X2 Other specified disorders of left ear: Secondary | ICD-10-CM | POA: Diagnosis not present

## 2019-02-20 MED ORDER — MECLIZINE HCL 12.5 MG PO TABS: 12.5000 mg | ORAL_TABLET | Freq: Three times a day (TID) | ORAL | 0 refills | Status: DC | PRN

## 2019-02-20 NOTE — Progress Notes (Signed)
Name: Erika Williamson   MRN: [id number]    DOB: June 01, 1976   Date:02/20/2019       Progress Note  Subjective  Chief Complaint  Chief Complaint  Patient presents with  . Nausea    patient stated that she was out of work due to severe nausea and gi upset  . Cerumen Impaction    left ear. tried peroxide and warm water.     HPI  States last week had GI upset, states has nausea, vomiting and diarrhea for 2 days and then was just having residual nausea. States Nexium helped a lot. States today has normal bowel movement. She feels this was related to her acid-reflux. She has not had a change in fevers and chills for her baseline. States " I have had a fever every day of my life almost due to my lupus". Patient took herself off of work Tuesday when GI upset started and requesting to be out till next Wednesday 5/13 as she still has some nausea and states mask makes nausea worse and she has to wear mask at work.   Patient also notes that it feels like left ear is completely clogged up. Both ears have muffled sounds but worse in left. Has been using peroxide and warm water and suction without relief. Denies using cotton swabs in ear. No ear pain.   PHQ2/9: Depression screen [facility] 2/9 02/20/2019 10/10/2018 06/15/2016 04/03/2016 08/07/2015  Decreased Interest 0 3 2 0 2  Down, Depressed, Hopeless 0 3 2 3 2   PHQ - 2 Score 0 6 4 3 4   Altered sleeping 0 3 3 3  0  Tired, decreased energy 0 3 2 3 2   Change in appetite 0 0 2 3 3   Feeling bad or failure about yourself  0 0 2 0 2  Trouble concentrating 0 0 1 3 0  Moving slowly or fidgety/restless 0 0 0 1 1  Suicidal thoughts 0 0 1 0 1  PHQ-9 Score 0 12 15 16 13   Difficult doing work/chores Not difficult at all Not difficult at all Very difficult Extremely dIfficult Not difficult at all  Some encounter information is confidential and restricted. Go to Review Flowsheets activity to see all data.     PHQ reviewed. Negative  Patient Active Problem List    Diagnosis Date Noted  . 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 Cat bite of forearm 06/17/2016  . Chronic obstructive pulmonary disease ([place]) 05/04/2016  . Contact dermatitis due to poison oak 04/03/2016  . Migraine without aura 04/03/2016  . NSAIDs adverse reaction 09/09/2015  . MDD (major depressive disorder), recurrent episode, moderate ([place]) 07/22/2015  . Gluten intolerance 06/19/2015  . Tobacco abuse   . Vitamin D deficiency disease   . HPV (human papilloma virus) infection   . High grade squamous intraepithelial cervical dysplasia   . GERD (gastroesophageal reflux disease)     Past Medical History:  Diagnosis Date  . 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 Anxiety   . Atypical face pain    Left Sided  . Depression   . Family history of adverse reaction to anesthesia    dad allergic to novacaine  . Foot fracture Left X2  . GERD (gastroesophageal reflux disease)   . HPV (human papilloma virus) infection   . Lupus ([place])   . Migraine without aura 04/03/2016  . Moderate recurrent major depression ([place]) 07/22/2015  . Occipital neuralgia of right side   .  Shingles   . Sinusitis   . Tension headache   . Tobacco abuse   . Tonsillitis   . Vitamin D deficiency disease     Past Surgical History:  Procedure Laterality Date  . birth control removed  06/09/2017  . CESAREAN SECTION     X2  . CHOLECYSTECTOMY     Dr Burt Knack  . COLONOSCOPY WITH PROPOFOL N/A 05/03/2015   Procedure: COLONOSCOPY WITH PROPOFOL;  Surgeon: Josefine Class, MD;  Location: [facility] ENDOSCOPY;  Service: Endoscopy;  Laterality: N/A;  . DILITATION & CURRETTAGE/HYSTROSCOPY WITH NOVASURE ABLATION N/A 08/24/2017   Procedure: DILATATION & CURETTAGE/HYSTEROSCOPY (NO ABLATION DONE);  Surgeon: Rubie Maid, MD;  Location: [facility] ORS;  Service: Gynecology;  Laterality: N/A;  . DILITATION & CURRETTAGE/HYSTROSCOPY WITH THERMACHOICE ABLATION N/A 09/22/2017    Procedure: DILATATION & CURETTAGE/HYSTEROSCOPY WITH [employer]ES[employer] HTA  [place];  Surgeon: Rubie Maid, MD;  Location: [facility] ORS;  Service: Gynecology;  Laterality: N/A;  . ESOPHAGOGASTRODUODENOSCOPY N/A 05/03/2015   Procedure: ESOPHAGOGASTRODUODENOSCOPY (EGD);  Surgeon: Josefine Class, MD;  Location[facility] ENDOSCOPY;  Service: Endoscopy;  Laterality: N/A;  . 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 TONSILLECTOMY    . TUBAL LIGATION    . WISDOM TOOTH EXTRACTION      Social History   Tobacco Use  . Smoking status: Current Every Day Smoker    Packs/day: 1.00    Years: 22.00    Pack years: 22.00    Types: Cigarettes    Start date: 09/03/1993  . Smokeless tobacco: Never Used  Substance Use Topics  . Alcohol use: No    Alcohol/week: 0.0 standard drinks     Current Outpatient Medications:  .  baclofen (LIORESAL) 10 MG tablet, Take 10 mg by mouth 3 (three) times daily. , Disp: , Rfl:  .  cholecalciferol (VITAMIN D) 1000 units tablet, Take 5,000 Units by mouth once a week., Disp: , Rfl:  .  diphenhydrAMINE (BENADRYL) 12.5 MG/5ML liquid, Take by mouth 4 (four) times daily as needed., Disp: , Rfl:  .  esomeprazole (NEXIUM) 20 MG capsule, Take 20 mg by mouth daily at 12 noon., Disp: , Rfl:  .  hydrocortisone 2.5 % cream, Apply 1 application topically as needed (rash). On face, Disp: , Rfl:  .  lamoTRIgine (LAMICTAL) 200 MG tablet, Take 1 tablet (200 mg total) by mouth daily., Disp: 90 tablet, Rfl: 1 .  methocarbamol (ROBAXIN) 750 MG tablet, Take 750 mg by mouth daily. , Disp: , Rfl:  .  PARoxetine (PAXIL) 40 MG tablet, Take 1.5 tablets (60 mg total) by mouth daily., Disp: , Rfl:  .  rizatriptan (MAXALT) 10 MG tablet, Take 10 mg by mouth as needed for migraine. , Disp: , Rfl:  .  trolamine salicylate (ASPERCREME) 10 % cream, Apply 1 application topically as needed (neck pain)., Disp: , Rfl:  .  acetaminophen (TYLENOL 8 HOUR) 650 MG CR tablet, Take 2 tablets (1,300 mg total) by mouth  every 8 (eight) hours as needed for pain., Disp: 30 tablet, Rfl: 1 .  acetaminophen (TYLENOL) 500 MG tablet, Take 1,000 mg by mouth every 6 (six) hours as needed for moderate pain., Disp: , Rfl:  .  acetaminophen (TYLENOL) 500 MG tablet, Take 1 tablet (500 mg total) by mouth every 6 (six) hours as needed., Disp: 30 tablet, Rfl: 0 .  cetirizine (ZYRTEC) 10 MG tablet, Take 10 mg by mouth as needed. , Disp: , Rfl:  .  halobetasol (ULTRAVATE) 0.05 %  ointment, Apply 1 application topically as needed for rash., Disp: , Rfl:  .  naltrexone (DEPADE) 50 MG tablet, , Disp: , Rfl:   Allergies  Allergen Reactions  . Gabapentin Anaphylaxis  . Ivp Dye [Iodinated Diagnostic Agents] Anaphylaxis  . Ketorolac Anaphylaxis  . Metrizamide Anaphylaxis  . Naprosyn [Naproxen] Anaphylaxis  . Nsaids Anaphylaxis  . Tolmetin Anaphylaxis  . Tramadol Anaphylaxis  . Erythromycin Other (See Comments)    Flu like symptoms  . Famciclovir Other (See Comments)    unknown  . Levofloxacin Other (See Comments)    Muscle aches and contractions   . Seroquel [Quetiapine Fumarate] Hives  . Compazine [Prochlorperazine Edisylate] Rash and Other (See Comments)    Muscle aches  . Pristiq [Desvenlafaxine] Rash and Other (See Comments)    Muscle aches    ROS    No other specific complaints in a complete review of systems (except as listed in HPI above).  Objective  Vitals:   02/20/19 1126  BP: 128/76  Pulse: 94  Resp: 16  Temp: (!) 96.7 F (35.9 C)  TempSrc: Oral  SpO2: 95%  Weight: 162 lb 4.8 oz (73.6 kg)  Height: 5' 3.5" (1.613 m)     Body mass index is 28.3 kg/m.  Nursing Note and Vital Signs reviewed.  Physical Exam Constitutional:      Appearance: Normal appearance. She is well-developed.  HENT:     Head: Normocephalic and atraumatic.     Right Ear: Hearing, ear canal and external ear normal. There is impacted cerumen.     Left Ear: Hearing, ear canal and external ear normal. There is impacted  cerumen.     Ears:     Comments: Bilateral cerumen impaction, small amounts of cerumen in left ear, irrigated by CMA Eyes:     Conjunctiva/sclera: Conjunctivae normal.     Pupils: Pupils are equal, round, and reactive to light.  Cardiovascular:     Rate and Rhythm: Normal rate.  Pulmonary:     Effort: Pulmonary effort is normal.  Musculoskeletal: Normal range of motion.  Skin:    General: Skin is warm.  Neurological:     Mental Status: She is alert and oriented to person, place, and time.  Psychiatric:        Speech: Speech normal.        Behavior: Behavior normal. Behavior is cooperative.        Thought Content: Thought content normal.        Judgment: Judgment normal.        No results found for this or any previous visit (from the past 48 hour(s)).  Assessment & Plan  1. Nausea Patient declined blood work, and initially medication due to list of allergies, will trial this to help with muffled sounds and nausea. She will discuss with her pharmacist. Informed patient cannot take her out till next Wednesday provided her with note to allow her to return to work 48 hours after vomiting and diarrhea have subsided.  - meclizine (ANTIVERT) 12.5 MG tablet; Take 1 tablet (12.5 mg total) by mouth 3 (three) times daily as needed for nausea.  Dispense: 30 tablet; Refill: 0  2. Ear fullness, left - meclizine (ANTIVERT) 12.5 MG tablet; Take 1 tablet (12.5 mg total) by mouth 3 (three) times daily as needed for nausea.  Dispense: 30 tablet; Refill: 0  3. Bilateral impacted cerumen - meclizine (ANTIVERT) 12.5 MG tablet; Take 1 tablet (12.5 mg total) by mouth 3 (three) times daily as needed for  nausea.  Dispense: 30 tablet; Refill: 0   -Red flags and when to present for emergency care or RTC including fever >101.75F, chest pain, shortness of breath, new/worsening/un-resolving symptoms, reviewed with patient at time of visit. Follow up and care instructions discussed and provided in  AVS.

## 2019-02-20 NOTE — Patient Instructions (Addendum)
I am sending a prescription for 12.5mg  of meclizine that you can take up to every 8 hours as needed for nausea; it has listed cornstarch as a ingredient that is also used in making seroquel but is not the active ingredient. Please check with your pharmacist before taking this medication. If you are concerned or unable to take this medication please call and I am happy to switch it to phenergan    For earwax build up use ONE of these over the counter options to soften up with ear wax in your ear: mineral oil drops, docusate (liquid), debrox. Place a 2 drops in your ear while laying on your left side- Keep laying for 30-60 minutes on that side after application. Can use once daily to soften ear wax. Wash in shower afterwards,  . do not stick anything in your ear as it further impact the ear wax

## 2019-02-20 NOTE — Telephone Encounter (Signed)
Return to work note provided with patient as discussed. If nausea medicine does not improve symptoms in 1-2 days we can trial another nausea medicine. Discussed with attending physician and practice manager there is not an appropriate diagnosis for extended leave of absence past what is provided in note at this time. We can do further testing if needed.

## 2019-04-17 ENCOUNTER — Telehealth: Payer: Self-pay

## 2019-04-17 NOTE — Telephone Encounter (Signed)
Pt prescreened no symptoms.   Coronavirus (COVID-19) Are you at risk?  Are you at risk for the Coronavirus (COVID-19)?  To be considered HIGH RISK for Coronavirus (COVID-19), you have to meet the following criteria:  . Traveled to [place], [place], [place], [place] or [place]; or in the [place] to [place], [place], [place], or [place]; and have fever, cough, and shortness of breath within the last 2 weeks of travel OR . Been in close contact with a person diagnosed with COVID-19 within the last 2 weeks and have fever, cough, and shortness of breath . IF YOU DO NOT MEET THESE CRITERIA, YOU ARE CONSIDERED LOW RISK FOR COVID-19.  What to do if you are HIGH RISK for COVID-19?  Marland Kitchen If you are having a medical emergency, call 911. . Seek medical care right away. Before you go to a doctor's office, urgent care or emergency department, call ahead and tell them about your recent travel, contact with someone diagnosed with COVID-19, and your symptoms. You should receive instructions from your physician's office regarding next steps of care.  . When you arrive at healthcare provider, tell the healthcare staff immediately you have returned from visiting [place], [place], [place], [place] or [place]; or traveled in the [place] to [place], [place], [place], or [place]; in the last two weeks or you have been in close contact with a person diagnosed with COVID-19 in the last 2 weeks.   . Tell the health care staff about your symptoms: fever, cough and shortness of breath. . After you have been seen by a medical provider, you will be either: o Tested for (COVID-19) and discharged home on quarantine except to seek medical care if symptoms worsen, and asked to  - Stay home and avoid contact with others until you get your results (4-5 days)  - Avoid travel on public transportation if possible (such as bus, train, or airplane) or o Sent to the Emergency Department by EMS for evaluation,  COVID-19 testing, and possible admission depending on your condition and test results.  What to do if you are LOW RISK for COVID-19?  Reduce your risk of any infection by using the same precautions used for avoiding the common cold or flu:  Marland Kitchen Wash your hands often with soap and warm water for at least 20 seconds.  If soap and water are not readily available, use an alcohol-based hand sanitizer with at least 60% alcohol.  . If coughing or sneezing, cover your mouth and nose by coughing or sneezing into the elbow areas of your shirt or coat, into a tissue or into your sleeve (not your hands). . Avoid shaking hands with others and consider head nods or verbal greetings only. . Avoid touching your eyes, nose, or mouth with unwashed hands.  . Avoid close contact with people who are sick. . Avoid places or events with large numbers of people in one location, like concerts or sporting events. . Carefully consider travel plans you have or are making. . If you are planning any travel outside or inside the [place], visit the CDC's Travelers' Health webpage for the latest health notices. . If you have some symptoms but not all symptoms, continue to monitor at home and seek medical attention if your symptoms worsen. . If you are having a medical emergency, call 911.   [place] / e-Visit: [email or web address]         [facility]  [facility]gent [facility]: [facility]: [facility]: [phone]

## 2019-04-18 ENCOUNTER — Encounter: Payer: Self-pay | Admitting: Obstetrics and Gynecology

## 2019-04-18 ENCOUNTER — Ambulatory Visit (INDEPENDENT_AMBULATORY_CARE_PROVIDER_SITE_OTHER): Payer: BC Managed Care – PPO

## 2019-04-18 ENCOUNTER — Ambulatory Visit (INDEPENDENT_AMBULATORY_CARE_PROVIDER_SITE_OTHER): Payer: BC Managed Care – PPO | Admitting: Obstetrics and Gynecology

## 2019-04-18 ENCOUNTER — Other Ambulatory Visit: Payer: Self-pay

## 2019-04-18 VITALS — BP 121/79 | HR 81 | Ht 63.5 in | Wt 166.3 lb

## 2019-04-18 DIAGNOSIS — Z9889 Other specified postprocedural states: Secondary | ICD-10-CM | POA: Diagnosis not present

## 2019-04-18 DIAGNOSIS — R102 Pelvic and perineal pain: Secondary | ICD-10-CM | POA: Diagnosis not present

## 2019-04-18 DIAGNOSIS — N938 Other specified abnormal uterine and vaginal bleeding: Secondary | ICD-10-CM

## 2019-04-18 DIAGNOSIS — N939 Abnormal uterine and vaginal bleeding, unspecified: Secondary | ICD-10-CM | POA: Diagnosis not present

## 2019-04-18 NOTE — Patient Instructions (Addendum)
You are scheduled for surgery on 05/11/2019.  Nothing to eat after midnight on day prior to surgery.  Do not take any medications unless recommended by your provider on day prior to surgery.  Do not take NSAIDs (Motrin, Aleve) or aspirin 7 days prior to surgery.  You may take Tylenol products for minor aches and pains.  You will receive a prescription for pain medications post-operatively.  You will be contacted by phone approximately 1 week prior to surgery to schedule pre-operative appointment.  Please call the office if you have any questions regarding you upcoming surgery.      Total Laparoscopic Hysterectomy A total laparoscopic hysterectomy is a minimally invasive surgery to remove the uterus and cervix. The fallopian tubes and ovaries can also be removed (bilateral salpingo-oophorectomy) during this surgery, if necessary. This procedure may be done to treat problems such as:  Noncancerous growths in the uterus (uterine fibroids) that cause symptoms.  A condition that causes the lining of the uterus (endometrium) to grow in other areas (endometriosis).  Problems with pelvic support. This is caused by weakened muscles of the pelvis following vaginal childbirth or menopause.  Cancer of the cervix, ovaries, uterus, or endometrium.  Excessive (dysfunctional) uterine bleeding. This surgery is performed by inserting a thin, lighted tube (laparoscope) and surgical instruments into small incisions in the abdomen. The laparoscope sends images to a monitor. The images help the health care provider perform the procedure. After this procedure, you will no longer be able to have a baby, and you will no longer have a menstrual period. Tell a health care provider about:  Any allergies you have.  All medicines you are taking, including vitamins, herbs, eye drops, creams, and over-the-counter medicines.  Any problems you or family members have had with anesthetic medicines.  Any blood disorders  you have.  Any surgeries you have had.  Any medical conditions you have.  Whether you are pregnant or may be pregnant. What are the risks? Generally, this is a safe procedure. However, problems may occur, including:  Infection.  Bleeding.  Blood clots in the legs or lungs.  Allergic reactions to medicines.  Damage to other structures or organs.  The risk that the surgery may have to be switched to the regular one in which a large incision is made in the abdomen (abdominal hysterectomy). What happens before the procedure? Staying hydrated Follow instructions from your health care provider about hydration, which may include:  Up to 2 hours before the procedure - you may continue to drink clear liquids, such as water, clear fruit juice, black coffee, and plain tea Eating and drinking restrictions Follow instructions from your health care provider about eating and drinking, which may include:  8 hours before the procedure - stop eating heavy meals or foods such as meat, fried foods, or fatty foods.  6 hours before the procedure - stop eating light meals or foods, such as toast or cereal.  6 hours before the procedure - stop drinking milk or drinks that contain milk.  2 hours before the procedure - stop drinking clear liquids. Medicines  Ask your health care provider about: ? Changing or stopping your regular medicines. This is especially important if you are taking diabetes medicines or blood thinners. ? Taking over-the-counter medicines, vitamins, herbs, and supplements. ? Taking medicines such as aspirin and ibuprofen. These medicines can thin your blood. Do not take these medicines unless your health care provider tells you to take them.  You may be given  antibiotic medicine to help prevent infection.  You may be asked to take laxatives.  You may be given medicines to help prevent nausea and vomiting after the procedure. General instructions  Ask your health care  provider how your surgical site will be marked or identified.  You may be asked to shower with a germ-killing soap.  Do not use any products that contain nicotine or tobacco, such as cigarettes and e-cigarettes. If you need help quitting, ask your health care provider.  You may have an exam or testing, such as an ultrasound to determine the size and shape of your pelvic organs.  You may have a blood or urine sample taken.  This procedure can affect the way you feel about yourself. Talk with your health care provider about the physical and emotional changes hysterectomy may cause.  Plan to have someone take you home from the hospital or clinic.  Plan to have a responsible adult care for you for at least 24 hours after you leave the hospital or clinic. This is important. What happens during the procedure?  To lower your risk of infection: ? Your health care team will wash or sanitize their hands. ? Your skin will be washed with soap. ? Hair may be removed from the surgical area.  An IV will be inserted into one of your veins.  You will be given one or more of the following: ? A medicine to help you relax (sedative). ? A medicine to make you fall asleep (general anesthetic).  You will be given antibiotic medicine through your IV.  A tube may be inserted down your throat to help you breathe during the procedure.  A gas (carbon dioxide) will be used to inflate your abdomen to allow your surgeon to see inside of your abdomen.  Three or four small incisions will be made in your abdomen.  A laparoscope will be inserted into one of your incisions. Surgical instruments will be inserted through the other incisions in order to perform the procedure.  Your uterus and cervix may be removed through your vagina or cut into small pieces and removed through the small incisions. Any other organs that need to be removed will also be removed this way.  Carbon dioxide will be released from inside  of your abdomen.  Your incisions will be closed with stitches (sutures).  A bandage (dressing) may be placed over your incisions. The procedure may vary among health care providers and hospitals. What happens after the procedure?  Your blood pressure, heart rate, breathing rate, and blood oxygen level will be monitored until the medicines you were given have worn off.  You will be given medicine for pain and nausea as needed.  Do not drive for 24 hours if you received a sedative. Summary  Total Laparoscopic hysterectomy is a procedure to remove your uterus, cervix and sometimes the fallopian tubes and ovaries.  This procedure can affect the way you feel about yourself. Talk with your health care provider about the physical and emotional changes hysterectomy may cause.  After this procedure, you will no longer be able to have a baby, and you will no longer have a menstrual period.  You will be given pain medicine to control discomfort after this procedure. This information is not intended to replace advice given to you by your health care provider. Make sure you discuss any questions you have with your health care provider. Document Released: 08/02/2007 Document Revised: 09/17/2017 Document Reviewed: 12/16/2016 Elsevier Patient Education  2020 [employer]  

## 2019-04-18 NOTE — Progress Notes (Signed)
GYNECOLOGY PROGRESS NOTE  Subjective:    Patient ID: Erika Williamson, female    DOB: 18-Oct-1976, 43 y.o.   MRN: [id number]  HPI  Patient is a 43 y.o. G55P2000 female who presents for f/u of ultrasound results for pelvic pain and abnormal uterine bleeding. Denies major comiplaints today.    The following portions of the patient's history were reviewed and updated as appropriate: allergies, current medications, past family history, past medical history, past social history, past surgical history and problem list.  Review of Systems Pertinent items noted in HPI and remainder of comprehensive ROS otherwise negative.   Objective:   Blood pressure 121/79, pulse 81, height 5' 3.5" (1.613 m), weight 166 lb 4.8 oz (75.4 kg). General appearance: alert and no distress Remainder of exam deferred.  See exam from 12/23/2018.     Imaging:   Patient Name: Erika Williamson DOB: 09-28-1976 MRN: [id number] ULTRASOUND REPORT  Location: [facility]  Date of Service: 04/18/2019     Indications:Pelvic Pain Findings:  The uterus is anteverted and measures 8.8 x 4.8 x 5.9 cm. Echo texture is homogenous without evidence of focal masses.  The Endometrium measures 12 mm.Anechoic fluid collection in endometrial cavity measuring 4.3 x 2.3 cm.  Right Ovary measures 2.0 x 1.4 x 1.6 cm. It is normal in appearance. Left Ovary measures 3.4 x 1.8 x 2.4 cm. It is not normal in appearance.Complex cystic structure with solid component measuring 1.8 x 1.4 x 1.4 cm.No vascularizes seen with in cyst. Survey of the adnexa demonstrates no adnexal masses. There is no free fluid in the cul de sac.  Impression: 1. Large anechoic fluid collection with in the endometrium. Possible Hydrometra. 2. Complex Lt Ovarian cyst.  Recommendations: 1.Clinical correlation with the patient's History and Physical Exam.   Jenine M. Albertine Grates    RDMS    I have reviewed this study and agree with documented  findings.    Rubie Maid, MD [facility]    Assessment:   Pelvic pain Abnormal uterine bleeding History of endometrial ablation  Plan:   1. Discussed ultrasound results with patient. Appears to be an accumulation of fluid in the endometrial cavity, as well as small complex left ovarian cyst. Advised that cyst is small, could be monitored, but if surgical intervention was desired for bleeding, could manage at the same time.  2. Discussed management options again for abnormal uterine bleeding including NSAIDs (Naproxen), tranexamic acid (Lysteda), oral progesterone, Depo Provera, Levonogestrel IUD, or hysterectomy as definitive surgical management. Patient has had an endometrial ablation in the past. Discussed risks and benefits of each method.   Patient desires hysterectomy. 3. Patient desires definitive management with hysterectomy.  I proposed doing a total laparoscopic hysterectomy and prophylactic bilateral salpingectomy.  Patient does tend to note that often her pain is more right sided.  Discussed that if ovaries appear normal, can retain. If there is an indication for removal, would remove the right side only. Patient agrees with this proposed surgery.  The risks of surgery were discussed in detail with the patient including but not limited to: bleeding which may require transfusion or reoperation; infection which may require antibiotics; injury to bowel, bladder, ureters or other surrounding organs; need for additional procedures including laparotomy; thromboembolic phenomenon, incisional problems and other postoperative/anesthesia complications.  Patient was also advised that she will remain in house for 1-2 nights; and expected recovery time after a hysterectomy is 6-8 weeks.  Likelihood of success in alleviating the patient's  symptoms was discussed.   She was told that she will be contacted by our surgical scheduler regarding the time and date of her surgery; routine  preoperative instructions of having nothing to eat or drink after midnight on the day prior to surgery and also coming to the hospital 1.5 hours prior to her time of surgery were also emphasized.  She was told she may be called for a preoperative appointment about a week prior to surgery and will be given further preoperative instructions at that visit.  Routine postoperative instructions will be reviewed with the patient and her family in detail after surgery. Surgery to be scheduled for 05/15/2019   A total of 15 minutes were spent face-to-face with the patient during this encounter and over half of that time dealt with counseling and coordination of care.   Rubie Maid, MD [facility]

## 2019-04-18 NOTE — Progress Notes (Signed)
Pt is present for a follow up after an ultrasound.

## 2019-04-19 NOTE — H&P (Signed)
GYNECOLOGY PREOPERATIVE HISTORY AND PHYSICAL   Subjective:  Erika Williamson is a 43 y.o. G2P2000 here for surgical management of abnormal uterine bleeding and pelvic pain.  No significant preoperative concerns. She has a past surgical history of endometrial ablation in 2018.   She notes that since November 2019 that she has had off and on spotting to light bleeding, usually occurring every 2-3 days.  She does have associated cramping (and pelvic pain mostly near C-section scar especially when bending or lifting).  Proposed surgery: Laparoscopic total hysterectomy with bilateral salpingectomy, possible right oophorectomy    Pertinent Gynecological History: Menses: flow is light, irregularly occuring every 2-3 days.  Bleeding: abnormal uterine bleeding Contraception: tubal ligation Last mammogram: patient has never had a mammogram Last pap: normal Date: 07/2017   Past Medical History:  Diagnosis Date  . 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 Anxiety   . Atypical face pain    Left Sided  . Depression   . Family history of adverse reaction to anesthesia    dad allergic to novacaine  . Foot fracture Left X2  . GERD (gastroesophageal reflux disease)   . HPV (human papilloma virus) infection   . Lupus ([place])   . Migraine without aura 04/03/2016  . 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 Occipital neuralgia of right side   . Shingles   . Sinusitis   . Tension headache   . Tobacco abuse   . Tonsillitis   . Vitamin D deficiency disease      Past Surgical History:  Procedure Laterality Date  . birth control removed  06/09/2017  . CESAREAN SECTION     X2  . CHOLECYSTECTOMY     Dr Burt Knack  . COLONOSCOPY WITH PROPOFOL N/A 05/03/2015   Procedure: COLONOSCOPY WITH PROPOFOL;  Surgeon: Josefine Class, MD;  Location: [facility]COPY;  Service: Endoscopy;  Laterality: N/A;  . DILITATION &  CURRETTAGE/HYSTROSCOPY WITH NOVASURE ABLATION N/A 08/24/2017   Procedure: DILATATION & CURETTAGE/HYSTEROSCOPY (NO ABLATION DONE);  Surgeon: Rubie Maid, MD;  Location: [facility] ORS;  Service: Gynecology;  Laterality: N/A;  . DILITATION & CURRETTAGE/HYSTROSCOPY WITH THERMACHOICE ABLATION N/A 09/22/2017   Procedure: DILATATION & CURETTAGE/HYSTEROSCOPY WITH B[employer] SCIENTIFIC GENESYS HTA  [place];  Surgeon: Rubie Maid, MD;  Location: [facility] ORS;  Service: Gynecology;  Laterality: N/A;  . ESOPHAGOGASTRODUODENOSCOPY N/A 05/03/2015   Procedure: ESOPHAGOGASTRODUODENOSCOPY (EGD);  Surgeon: Josefine Class, MD;  Location: [facility] ENDOSCOPY;  Service: Endoscopy;  Laterality: N/A;  . 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 TONSILLECTOMY    . TUBAL LIGATION    . WISDOM TOOTH EXTRACTION       OB History  Gravida Para Term Preterm AB Living  2 2 2         SAB TAB Ectopic Multiple Live Births               # Outcome Date GA Lbr Len/2nd Weight Sex Delivery Anes PTL Lv  2 Term      CS-LTranv     1 Term      CS-LTranv       Obstetric Comments  1st Menstrual Cycle:  10  1st Pregnancy:  54     Family History  Problem Relation Age of Onset  . Diabetes Mother   . Hypertension Mother   . COPD Mother   . Stroke Mother   . Osteoporosis Mother   . Depression  Mother   . Cancer Father 43       colorectal, spread to lung  . Alcohol abuse Father   . Hypertension Father   . Depression Father   . Diabetes Sister   . COPD Sister   . Anxiety disorder Sister   . Depression Sister   . Fibromyalgia Sister   . Asthma Sister   . ADD / ADHD Son   . ADD / ADHD Daughter      Social History   Socioeconomic History  . Marital status: Divorced    Spouse name: Not on file  . Number of children: 2  . Years of education: Not on file  . Highest education level: Not on file  Occupational History  . Not on file  Social Needs  . Financial resource strain: Not hard at all  . Food insecurity    Worry: Never true     Inability: Never true  . Transportation needs    Medical: No    Non-medical: No  Tobacco Use  . Smoking status: Current Every Day Smoker    Packs/day: 1.00    Years: 22.00    Pack years: 22.00    Types: Cigarettes    Start date: 09/03/1993  . Smokeless tobacco: Never Used  Substance and Sexual Activity  . Alcohol use: No    Alcohol/week: 0.0 standard drinks  . Drug use: No  . Sexual activity: Not Currently    Partners: Male    Birth control/protection: None  Lifestyle  . Physical activity    Days per week: 0 days    Minutes per session: 0 min  . Stress: Not at all  Relationships  . Social Herbalist on phone: Three times a week    Gets together: Once a week    Attends religious service: Never    Active member of club or organization: No    Attends meetings of clubs or organizations: Never    Relationship status: Never married  . Intimate partner violence    Fear of current or ex partner: No    Emotionally abused: No    Physically abused: No    Forced sexual activity: No  Other Topics Concern  . Not on file  Social History Narrative  . Not on file     Current Outpatient Medications on File Prior to Visit  Medication Sig Dispense Refill  . acetaminophen (TYLENOL 8 HOUR) 650 MG CR tablet Take 2 tablets (1,300 mg total) by mouth every 8 (eight) hours as needed for pain. 30 tablet 1  . acetaminophen (TYLENOL) 500 MG tablet Take 1,000 mg by mouth every 6 (six) hours as needed for moderate pain.    Marland Kitchen acetaminophen (TYLENOL) 500 MG tablet Take 1 tablet (500 mg total) by mouth every 6 (six) hours as needed. 30 tablet 0  . baclofen (LIORESAL) 10 MG tablet Take 10 mg by mouth 3 (three) times daily.     . cetirizine (ZYRTEC) 10 MG tablet Take 10 mg by mouth as needed.     . cholecalciferol (VITAMIN D) 1000 units tablet Take 5,000 Units by mouth once a week.    . diphenhydrAMINE (BENADRYL) 12.5 MG/5ML liquid Take by mouth 4 (four) times daily as needed.    Marland Kitchen  esomeprazole (NEXIUM) 20 MG capsule Take 20 mg by mouth daily at 12 noon.    . halobetasol (ULTRAVATE) 0.05 % ointment Apply 1 application topically as needed for rash.    . hydrocortisone 2.5 %  cream Apply 1 application topically as needed (rash). On face    . naltrexone (DEPADE) 50 MG tablet     . PARoxetine (PAXIL) 40 MG tablet Take 1.5 tablets (60 mg total) by mouth daily.    . rizatriptan (MAXALT) 10 MG tablet Take 10 mg by mouth as needed for migraine.     . trolamine salicylate (ASPERCREME) 10 % cream Apply 1 application topically as needed (neck pain).    Marland Kitchen lamoTRIgine (LAMICTAL) 200 MG tablet Take 1 tablet (200 mg total) by mouth daily. 90 tablet 1  . meclizine (ANTIVERT) 12.5 MG tablet Take 1 tablet (12.5 mg total) by mouth 3 (three) times daily as needed for nausea. (Patient not taking: Reported on 04/18/2019) 30 tablet 0  . methocarbamol (ROBAXIN) 750 MG tablet Take 750 mg by mouth daily.      No current facility-administered medications on file prior to visit.      Allergies  Allergen Reactions  . Gabapentin Anaphylaxis  . Ivp Dye [Iodinated Diagnostic Agents] Anaphylaxis  . Ketorolac Anaphylaxis  . Metrizamide Anaphylaxis  . Naprosyn [Naproxen] Anaphylaxis  . Nsaids Anaphylaxis  . Tolmetin Anaphylaxis  . Tramadol Anaphylaxis  . Erythromycin Other (See Comments)    Flu like symptoms  . Famciclovir Other (See Comments)    unknown  . Levofloxacin Other (See Comments)    Muscle aches and contractions   . Seroquel [Quetiapine Fumarate] Hives  . Compazine [Prochlorperazine Edisylate] Rash and Other (See Comments)    Muscle aches  . Pristiq [Desvenlafaxine] Rash and Other (See Comments)    Muscle aches     Review of Systems Constitutional: No recent fever/chills/sweats Respiratory: No recent cough/bronchitis Cardiovascular: No chest pain Gastrointestinal: No recent nausea/vomiting/diarrhea Genitourinary: No UTI symptoms Hematologic/lymphatic:No history of  coagulopathy or recent blood thinner use    Objective:   Blood pressure 121/79, pulse 81, height 5' 3.5" (1.613 m), weight 166 lb 4.8 oz (75.4 kg). CONSTITUTIONAL: Well-developed, well-nourished female in no acute distress.  HENT:  Normocephalic, atraumatic, External right and left ear normal. Oropharynx is clear and moist EYES: Conjunctivae and EOM are normal. Pupils are equal, round, and reactive to light. No scleral icterus.  NECK: Normal range of motion, supple, no masses SKIN: Skin is warm and dry. No rash noted. Not diaphoretic. No erythema. No pallor. NEUROLOGIC: Alert and oriented to person, place, and time. Normal reflexes, muscle tone coordination. No cranial nerve deficit noted. PSYCHIATRIC: Normal mood and affect. Normal behavior. Normal judgment and thought content. CARDIOVASCULAR: Normal heart rate noted, regular rhythm RESPIRATORY: Effort and breath sounds normal, no problems with respiration noted ABDOMEN: Soft, nontender, nondistended. PELVIC: Deferred MUSCULOSKELETAL: Normal range of motion. No edema and no tenderness. 2+ distal pulses.    Labs: No results found for this or any previous visit (from the past 336 hour(s)).  Imaging Studies: US Pelvis (transabdominal Only)  Result Date: 04/19/2019 Patient Name: BRILEIGH SEVCIK DOB: Apr 21, 1976 MRN: [id number] ULTRASOUND REPORT Location: [facility] Date of Service: 04/18/2019 Indications:Pelvic Pain Findings: The uterus is anteverted and measures 8.8 x 4.8 x 5.9 cm. Echo texture is homogenous without evidence of focal masses. The Endometrium measures 12 mm.Anechoic fluid collection in endometrial cavity measuring 4.3 x 2.3 cm. Right Ovary measures 2.0 x 1.4 x 1.6 cm. It is normal in appearance. Left Ovary measures 3.4 x 1.8 x 2.4 cm. It is not normal in appearance.Complex cystic structure with solid component measuring 1.8 x 1.4 x 1.4 cm.No vascularizes seen with in cyst. Survey of the  adnexa demonstrates no adnexal  masses. There is no free fluid in the cul de sac. Impression: 1. Large anechoic fluid collection with in the endometrium. Possible Hydrometra. 2. Complex Lt Ovarian cyst. Recommendations: 1.Clinical correlation with the patient's History and Physical Exam. Jenine M. Albertine Grates    RDMS I have reviewed this study and agree with documented findings. Rubie Maid, MD [facility]   Assessment:    Abnormal uterine bleeding Pelvic pain History of endometrial ablation  Plan:    Counseling: Procedure, risks, reasons, benefits and complications (including injury to bowel, bladder, major blood vessel, ureter, bleeding, possibility of transfusion, infection, or fistula formation) reviewed in detail. Likelihood of success in alleviating the patient's condition was discussed. Routine postoperative instructions will be reviewed with the patient and her family in detail after surgery.  The patient concurred with the proposed plan, giving informed written consent for the surgery.   Preop testing ordered. Instructions reviewed, including NPO after midnight.

## 2019-04-19 NOTE — H&P (View-Only) (Signed)
GYNECOLOGY PREOPERATIVE HISTORY AND PHYSICAL   Subjective:  Erika Williamson is a 43 y.o. G2P2000 here for surgical management of abnormal uterine bleeding and pelvic pain.  No significant preoperative concerns. She has a past surgical history of endometrial ablation in 2018.   She notes that since November 2019 that she has had off and on spotting to light bleeding, usually occurring every 2-3 days.  She does have associated cramping (and pelvic pain mostly near C-section scar especially when bending or lifting).  Proposed surgery: Laparoscopic total hysterectomy with bilateral salpingectomy, possible right oophorectomy    Pertinent Gynecological History: Menses: flow is light, irregularly occuring every 2-3 days.  Bleeding: abnormal uterine bleeding Contraception: tubal ligation Last mammogram: patient has never had a mammogram Last pap: normal Date: 07/2017   Past Medical History:  Diagnosis Date  . 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 Anxiety   . Atypical face pain    Left Sided  . Depression   . Family history of adverse reaction to anesthesia    dad allergic to novacaine  . Foot fracture Left X2  . GERD (gastroesophageal reflux disease)   . HPV (human papilloma virus) infection   . Lupus ([place])   . Migraine without aura 04/03/2016  . Moderate recurrent major depression ([place]) 07/22/2015  . Occipital neuralgia of right side   . Shingles   . Sinusitis   . Tension headache   . Tobacco abuse   . Tonsillitis   . Vitamin D deficiency disease      Past Surgical History:  Procedure Laterality Date  . birth control removed  06/09/2017  . CESAREAN SECTION     X2  . CHOLECYSTECTOMY     Dr Burt Knack  . COLONOSCOPY WITH PROPOFOL N/A 05/03/2015   Procedure: COLONOSCOPY WITH PROPOFOL;  Surgeon: Josefine Class, MD;  Location: [facility] ENDOSCOPY;  Service: Endoscopy;  Laterality: N/A;  . DILITATION &  CURRETTAGE/HYSTROSCOPY WITH NOVASURE ABLATION N/A 08/24/2017   Procedure: DILATATION & CURETTAGE/HYSTEROSCOPY (NO ABLATION DONE);  Surgeon: Rubie Maid, MD;  Location: [facility] ORS;  Service: Gynecology;  Laterality: N/A;  . DILITATION & CURRETTAGE/HYSTROSCOPY WITH THERMACHOICE ABLATION N/A 09/22/2017   Procedure: DILATATION & CURETTAGE/HYSTEROSCOPY WITH B[employer] GENESYS HTA  [place];  Surgeon: Rubie Maid, MD;  Location: [facility] ORS;  Service: Gynecology;  Laterality: N/A;  . ESOPHAGOGASTRODUODENOSCOPY N/A 05/03/2015   Procedure: ESOPHAGOGASTRODUODENOSCOPY (EGD);  Surgeon: Josefine Class, MD;  Location: [facility] ENDOSCOPY;  Service: Endoscopy;  Laterality: N/A;  . 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 TONSILLECTOMY    . TUBAL LIGATION    . WISDOM TOOTH EXTRACTION       OB History  Gravida Para Term Preterm AB Living  2 2 2         SAB TAB Ectopic Multiple Live Births               # Outcome Date GA Lbr Len/2nd Weight Sex Delivery Anes PTL Lv  2 Term      CS-LTranv     1 Term      CS-LTranv       Obstetric Comments  1st Menstrual Cycle:  10  1st Pregnancy:  1     Family History  Problem Relation Age of Onset  . Diabetes Mother   . Hypertension Mother   . COPD Mother   . Stroke Mother   . Osteoporosis Mother   . Depression  Mother   . Cancer Father 34       colorectal, spread to lung  . Alcohol abuse Father   . Hypertension Father   . Depression Father   . Diabetes Sister   . COPD Sister   . Anxiety disorder Sister   . Depression Sister   . Fibromyalgia Sister   . Asthma Sister   . ADD / ADHD Son   . ADD / ADHD Daughter      Social History   Socioeconomic History  . Marital status: Divorced    Spouse name: Not on file  . Number of children: 2  . Years of education: Not on file  . Highest education level: Not on file  Occupational History  . Not on file  Social Needs  . Financial resource strain: Not hard at all  . Food insecurity    Worry: Never true     Inability: Never true  . Transportation needs    Medical: No    Non-medical: No  Tobacco Use  . Smoking status: Current Every Day Smoker    Packs/day: 1.00    Years: 22.00    Pack years: 22.00    Types: Cigarettes    Start date: 09/03/1993  . Smokeless tobacco: Never Used  Substance and Sexual Activity  . Alcohol use: No    Alcohol/week: 0.0 standard drinks  . Drug use: No  . Sexual activity: Not Currently    Partners: Male    Birth control/protection: None  Lifestyle  . Physical activity    Days per week: 0 days    Minutes per session: 0 min  . Stress: Not at all  Relationships  . Social Herbalist on phone: Three times a week    Gets together: Once a week    Attends religious service: Never    Active member of club or organization: No    Attends meetings of clubs or organizations: Never    Relationship status: Never married  . Intimate partner violence    Fear of current or ex partner: No    Emotionally abused: No    Physically abused: No    Forced sexual activity: No  Other Topics Concern  . Not on file  Social History Narrative  . Not on file     Current Outpatient Medications on File Prior to Visit  Medication Sig Dispense Refill  . acetaminophen (TYLENOL 8 HOUR) 650 MG CR tablet Take 2 tablets (1,300 mg total) by mouth every 8 (eight) hours as needed for pain. 30 tablet 1  . acetaminophen (TYLENOL) 500 MG tablet Take 1,000 mg by mouth every 6 (six) hours as needed for moderate pain.    Marland Kitchen acetaminophen (TYLENOL) 500 MG tablet Take 1 tablet (500 mg total) by mouth every 6 (six) hours as needed. 30 tablet 0  . baclofen (LIORESAL) 10 MG tablet Take 10 mg by mouth 3 (three) times daily.     . cetirizine (ZYRTEC) 10 MG tablet Take 10 mg by mouth as needed.     . cholecalciferol (VITAMIN D) 1000 units tablet Take 5,000 Units by mouth once a week.    . diphenhydrAMINE (BENADRYL) 12.5 MG/5ML liquid Take by mouth 4 (four) times daily as needed.    Marland Kitchen  esomeprazole (NEXIUM) 20 MG capsule Take 20 mg by mouth daily at 12 noon.    . halobetasol (ULTRAVATE) 0.05 % ointment Apply 1 application topically as needed for rash.    . hydrocortisone 2.5 %  cream Apply 1 application topically as needed (rash). On face    . naltrexone (DEPADE) 50 MG tablet     . PARoxetine (PAXIL) 40 MG tablet Take 1.5 tablets (60 mg total) by mouth daily.    . rizatriptan (MAXALT) 10 MG tablet Take 10 mg by mouth as needed for migraine.     . trolamine salicylate (ASPERCREME) 10 % cream Apply 1 application topically as needed (neck pain).    Marland Kitchen lamoTRIgine (LAMICTAL) 200 MG tablet Take 1 tablet (200 mg total) by mouth daily. 90 tablet 1  . meclizine (ANTIVERT) 12.5 MG tablet Take 1 tablet (12.5 mg total) by mouth 3 (three) times daily as needed for nausea. (Patient not taking: Reported on 04/18/2019) 30 tablet 0  . methocarbamol (ROBAXIN) 750 MG tablet Take 750 mg by mouth daily.      No current facility-administered medications on file prior to visit.      Allergies  Allergen Reactions  . Gabapentin Anaphylaxis  . Ivp Dye [Iodinated Diagnostic Agents] Anaphylaxis  . Ketorolac Anaphylaxis  . Metrizamide Anaphylaxis  . Naprosyn [Naproxen] Anaphylaxis  . Nsaids Anaphylaxis  . Tolmetin Anaphylaxis  . Tramadol Anaphylaxis  . Erythromycin Other (See Comments)    Flu like symptoms  . Famciclovir Other (See Comments)    unknown  . Levofloxacin Other (See Comments)    Muscle aches and contractions   . Seroquel [Quetiapine Fumarate] Hives  . Compazine [Prochlorperazine Edisylate] Rash and Other (See Comments)    Muscle aches  . Pristiq [Desvenlafaxine] Rash and Other (See Comments)    Muscle aches     Review of Systems Constitutional: No recent fever/chills/sweats Respiratory: No recent cough/bronchitis Cardiovascular: No chest pain Gastrointestinal: No recent nausea/vomiting/diarrhea Genitourinary: No UTI symptoms Hematologic/lymphatic:No history of  coagulopathy or recent blood thinner use    Objective:   Blood pressure 121/79, pulse 81, height 5' 3.5" (1.613 m), weight 166 lb 4.8 oz (75.4 kg). CONSTITUTIONAL: Well-developed, well-nourished female in no acute distress.  HENT:  Normocephalic, atraumatic, External right and left ear normal. Oropharynx is clear and moist EYES: Conjunctivae and EOM are normal. Pupils are equal, round, and reactive to light. No scleral icterus.  NECK: Normal range of motion, supple, no masses SKIN: Skin is warm and dry. No rash noted. Not diaphoretic. No erythema. No pallor. NEUROLOGIC: Alert and oriented to person, place, and time. Normal reflexes, muscle tone coordination. No cranial nerve deficit noted. PSYCHIATRIC: Normal mood and affect. Normal behavior. Normal judgment and thought content. CARDIOVASCULAR: Normal heart rate noted, regular rhythm RESPIRATORY: Effort and breath sounds normal, no problems with respiration noted ABDOMEN: Soft, nontender, nondistended. PELVIC: Deferred MUSCULOSKELETAL: Normal range of motion. No edema and no tenderness. 2+ distal pulses.    Labs: No results found for this or any previous visit (from the past 336 hour(s)).  Imaging Studies: US Pelvis (transabdominal Only)  Result Date: 04/19/2019 Patient Name: IND[place] GAMERO DOB: [birth year] MRN: [id number] ULTRASOUND REPORT Location: [facility] Date of Service: 04/18/2019 Indications:Pelvic Pain Findings: The uterus is anteverted and measures 8.8 x 4.8 x 5.9 cm. Echo texture is homogenous without evidence of focal masses. The Endometrium measures 12 mm.Anechoic fluid collection in endometrial cavity measuring 4.3 x 2.3 cm. Right Ovary measures 2.0 x 1.4 x 1.6 cm. It is normal in appearance. Left Ovary measures 3.4 x 1.8 x 2.4 cm. It is not normal in appearance.Complex cystic structure with solid component measuring 1.8 x 1.4 x 1.4 cm.No vascularizes seen with in cyst. Survey of the  adnexa demonstrates no adnexal  masses. There is no free fluid in the cul de sac. Impression: 1. Large anechoic fluid collection with in the endometrium. Possible Hydrometra. 2. Complex Lt Ovarian cyst. Recommendations: 1.Clinical correlation with the patient's History and Physical Exam. Jenine M. Albertine Grates    RDMS I have reviewed this study and agree with documented findings. Rubie Maid, MD [facility]   Assessment:    Abnormal uterine bleeding Pelvic pain History of endometrial ablation  Plan:    Counseling: Procedure, risks, reasons, benefits and complications (including injury to bowel, bladder, major blood vessel, ureter, bleeding, possibility of transfusion, infection, or fistula formation) reviewed in detail. Likelihood of success in alleviating the patient's condition was discussed. Routine postoperative instructions will be reviewed with the patient and her family in detail after surgery.  The patient concurred with the proposed plan, giving informed written consent for the surgery.   Preop testing ordered. Instructions reviewed, including NPO after midnight.

## 2019-05-01 ENCOUNTER — Telehealth: Payer: Self-pay | Admitting: Obstetrics and Gynecology

## 2019-05-01 NOTE — Telephone Encounter (Signed)
The patient called and stated that she needs to verify that date her surgery in on so she is able to inform her employer and have her paperwork turned in, in time. Please advise.

## 2019-05-02 ENCOUNTER — Telehealth: Payer: Self-pay | Admitting: Obstetrics and Gynecology

## 2019-05-02 NOTE — Telephone Encounter (Signed)
The patient called and stated that she would like to speak with someone today if possible since she did not receive a call back in regards to confirming her surgery date for her employer. Please advise.

## 2019-05-04 NOTE — Telephone Encounter (Signed)
Spoke with pt concerning her surgery date. Pt stated that she has not heard anything from the hospital or anyone about scheduling her surgery. Pt was advised that I would speak with [facility] and OR scheduling tech about her issues.

## 2019-05-11 ENCOUNTER — Encounter
Admission: RE | Admit: 2019-05-11 | Discharge: 2019-05-11 | Disposition: A | Discharge status: Home / Self Care | Payer: BC Managed Care – PPO | Source: Ambulatory Visit | Attending: Obstetrics and Gynecology | Admitting: Obstetrics and Gynecology

## 2019-05-11 ENCOUNTER — Other Ambulatory Visit: Payer: Self-pay

## 2019-05-11 DIAGNOSIS — Z01818 Encounter for other preprocedural examination: Secondary | ICD-10-CM | POA: Insufficient documentation

## 2019-05-11 DIAGNOSIS — Z1159 Encounter for screening for other viral diseases: Secondary | ICD-10-CM | POA: Insufficient documentation

## 2019-05-11 HISTORY — DX: Personal history of other diseases of the digestive system: Z87.19

## 2019-05-11 HISTORY — DX: Other complications of anesthesia, initial encounter: T88.59XA

## 2019-05-11 LAB — COMPREHENSIVE METABOLIC PANEL
ALT: 15 U/L (ref 0–44)
AST: 18 U/L (ref 15–41)
Albumin: 4.3 g/dL (ref 3.5–5.0)
Alkaline Phosphatase: 61 U/L (ref 38–126)
Anion gap: 9 (ref 5–15)
BUN: <5 mg/dL — ABNORMAL LOW (ref 6–20)
CO2: 26 mmol/L (ref 22–32)
Calcium: 9.2 mg/dL (ref 8.9–10.3)
Chloride: 100 mmol/L (ref 98–111)
Creatinine, Ser: 0.47 mg/dL (ref 0.44–1.00)
GFR calc Af Amer: >60 mL/min (ref >60)
GFR calc non Af Amer: >60 mL/min (ref >60)
Glucose, Bld: 91 mg/dL (ref 70–99)
Potassium: 3.6 mmol/L (ref 3.5–5.1)
Sodium: 135 mmol/L (ref 135–145)
Total Bilirubin: 0.5 mg/dL (ref 0.3–1.2)
Total Protein: 7.8 g/dL (ref 6.5–8.1)

## 2019-05-11 LAB — SARS CORONAVIRUS 2 (TAT 6-24 HRS): SARS Coronavirus 2: NEGATIVE

## 2019-05-11 LAB — TYPE AND SCREEN
ABO/RH(D): A POS
Antibody Screen: NEGATIVE

## 2019-05-11 LAB — CBC
HCT: 42.4 % (ref 36.0–46.0)
Hemoglobin: 14.7 g/dL (ref 12.0–15.0)
MCH: 31.8 pg (ref 26.0–34.0)
MCHC: 34.7 g/dL (ref 30.0–36.0)
MCV: 91.8 fL (ref 80.0–100.0)
Platelets: 357 10*3/uL (ref 150–400)
RBC: 4.62 MIL/uL (ref 3.87–5.11)
RDW: 13.3 % (ref 11.5–15.5)
WBC: 11.6 10*3/uL — ABNORMAL HIGH (ref 4.0–10.5)
nRBC: 0 % (ref 0.0–0.2)

## 2019-05-11 NOTE — Patient Instructions (Signed)
Your procedure is scheduled on: 05-15-19 MONDAY Report to Same Day Surgery 2nd floor medical mall [facility] Entrance-take elevator on left to 2nd floor.  Check in with surgery information desk.) To find out your arrival time please call [phone] between 1PM - 3PM on 05-12-19 FRIDAY  Remember: Instructions that are not followed completely may result in serious medical risk, up to and including death, or upon the discretion of your surgeon and anesthesiologist your surgery may need to be rescheduled.    _x___ 1. Do not eat food after midnight the night before your procedure. NO GUM OR CANDY AFTER MIDNIGHT. You may drink clear liquids up to 2 hours before you are scheduled to arrive at the hospital for your procedure.  Do not drink clear liquids within 2 hours of your scheduled arrival to the hospital.  Clear liquids include  --Water or Apple juice without pulp  --Clear carbohydrate beverage such as ClearFast or Gatorade  --Black Coffee or Clear Tea (No milk, no creamers, do not add anything to the coffee or Tea   ____Ensure clear carbohydrate drink on the way to the hospital for bariatric patients  _X___Ensure clear carbohydrate drink 3 hours before surgery.     __x__ 2. No Alcohol for 24 hours before or after surgery.   __x__3. No Smoking or e-cigarettes for 24 prior to surgery.  Do not use any chewable tobacco products for at least 6 hour prior to surgery   ____  4. Bring all medications with you on the day of surgery if instructed.    __x__ 5. Notify your doctor if there is any change in your medical condition     (cold, fever, infections).    x___6. On the morning of surgery brush your teeth with toothpaste and water.  You may rinse your mouth with mouth wash if you wish.  Do not swallow any toothpaste or mouthwash.   Do not wear jewelry, make-up, hairpins, clips or nail polish.  Do not wear lotions, powders, or perfumes. You may wear deodorant.  Do not shave 48 hours prior  to surgery. Men may shave face and neck.  Do not bring valuables to the hospital.    [facility] is not responsible for any belongings or valuables.               Contacts, dentures or bridgework may not be worn into surgery.  Leave your suitcase in the car. After surgery it may be brought to your room.  For patients admitted to the hospital, discharge time is determined by your treatment team.  _  Patients discharged the day of surgery will not be allowed to drive home.  You will need someone to drive you home and stay with you the night of your procedure.    Please read over the following fact sheets that you were given:   [facility] Preparing for Surgery and or MRSA Information   _x___ TAKE THE FOLLOWING MEDICATION THE MORNING OF SURGERY WITH A SMALL SIP OF WATER. These include:  1. BACLOFEN   2. LAMICTAL  3. ROBAXIN  4. PAXIL  5.  [place]  6. TAKE A NEXIUM AT BEDTIME THE NIGHT BEFORE YOUR SURGERY  ____Fleets enema or Magnesium Citrate as directed.   _x___ Use CHG Soap or sage wipes as directed on instruction sheet   ____ Use inhalers on the day of surgery and bring to hospital day of surgery  ____ Stop Metformin and Janumet 2 days prior to surgery.  ____ Take 1/2 of usual insulin dose the night before surgery and none on the morning  surgery.   ____ Follow recommendations from Cardiologist, Pulmonologist or PCP regarding stopping Aspirin, Coumadin, Plavix ,Eliquis, Effient, or Pradaxa, and Pletal.  X____Stop Anti-inflammatories such as Advil, Aleve, Ibuprofen, Motrin, Naproxen, Naprosyn, Goodies powders or aspirin products NOW- OK to take Tylenol    ____ Stop supplements until after surgery.     ____ Bring C-Pap to the hospital.

## 2019-05-12 LAB — HEPATITIS C ANTIBODY: HCV Ab: <0.1 s/co ratio (ref 0.0–0.9)

## 2019-05-12 LAB — HIV ANTIBODY (ROUTINE TESTING W REFLEX): HIV Screen 4th Generation wRfx: NONREACTIVE

## 2019-05-12 LAB — RPR: RPR Ser Ql: NONREACTIVE

## 2019-05-12 LAB — HEPATITIS B SURFACE ANTIGEN: Hepatitis B Surface Ag: NEGATIVE

## 2019-05-12 NOTE — Addendum Note (Signed)
Addended by: Augusto Gamble on: 05/12/2019 08:27 AM   Modules accepted: Orders

## 2019-05-14 MED ORDER — CEFAZOLIN SODIUM-DEXTROSE 2-4 GM/100ML-% IV SOLN
2.0000 g | INTRAVENOUS | Status: AC
  Administered 2019-05-15: 2 g via INTRAVENOUS

## 2019-05-15 ENCOUNTER — Ambulatory Visit: Payer: BC Managed Care – PPO | Admitting: Certified Registered"

## 2019-05-15 ENCOUNTER — Encounter
Admission: AD | Disposition: A | Discharge status: Home / Self Care | Payer: Self-pay | Source: Home / Self Care | Attending: Obstetrics and Gynecology

## 2019-05-15 ENCOUNTER — Encounter: Payer: Self-pay | Admitting: *Deleted

## 2019-05-15 ENCOUNTER — Other Ambulatory Visit: Payer: Self-pay

## 2019-05-15 ENCOUNTER — Observation Stay
Admission: AD | Admit: 2019-05-15 | Discharge: 2019-05-16 | DRG: 743 | Disposition: A | Discharge status: Home / Self Care | Payer: BC Managed Care – PPO | Attending: Obstetrics and Gynecology | Admitting: Obstetrics and Gynecology

## 2019-05-15 DIAGNOSIS — Z9851 Tubal ligation status: Secondary | ICD-10-CM

## 2019-05-15 DIAGNOSIS — N939 Abnormal uterine and vaginal bleeding, unspecified: Secondary | ICD-10-CM | POA: Diagnosis not present

## 2019-05-15 DIAGNOSIS — F17210 Nicotine dependence, cigarettes, uncomplicated: Secondary | ICD-10-CM | POA: Diagnosis present

## 2019-05-15 DIAGNOSIS — Z90710 Acquired absence of both cervix and uterus: Secondary | ICD-10-CM

## 2019-05-15 DIAGNOSIS — N838 Other noninflammatory disorders of ovary, fallopian tube and broad ligament: Secondary | ICD-10-CM

## 2019-05-15 DIAGNOSIS — N938 Other specified abnormal uterine and vaginal bleeding: Secondary | ICD-10-CM | POA: Diagnosis not present

## 2019-05-15 DIAGNOSIS — D251 Intramural leiomyoma of uterus: Secondary | ICD-10-CM | POA: Diagnosis not present

## 2019-05-15 DIAGNOSIS — L930 Discoid lupus erythematosus: Secondary | ICD-10-CM

## 2019-05-15 DIAGNOSIS — N800 Endometriosis of uterus: Secondary | ICD-10-CM | POA: Diagnosis not present

## 2019-05-15 DIAGNOSIS — R102 Pelvic and perineal pain: Secondary | ICD-10-CM | POA: Diagnosis present

## 2019-05-15 DIAGNOSIS — N83201 Unspecified ovarian cyst, right side: Secondary | ICD-10-CM | POA: Diagnosis not present

## 2019-05-15 DIAGNOSIS — N888 Other specified noninflammatory disorders of cervix uteri: Secondary | ICD-10-CM

## 2019-05-15 DIAGNOSIS — B977 Papillomavirus as the cause of diseases classified elsewhere: Secondary | ICD-10-CM

## 2019-05-15 HISTORY — PX: LAPAROSCOPIC BILATERAL SALPINGO OOPHERECTOMY: SHX5890

## 2019-05-15 HISTORY — PX: TOTAL LAPAROSCOPIC HYSTERECTOMY WITH SALPINGECTOMY: SHX6742

## 2019-05-15 LAB — POCT PREGNANCY, URINE: Preg Test, Ur: NEGATIVE

## 2019-05-15 SURGERY — HYSTERECTOMY, TOTAL, LAPAROSCOPIC, WITH SALPINGECTOMY
Anesthesia: General | Site: Abdomen | Laterality: Right

## 2019-05-15 MED ORDER — DIPHENHYDRAMINE HCL 50 MG/ML IJ SOLN: 12.5000 mg | Freq: Four times a day (QID) | INTRAMUSCULAR | Status: DC | PRN

## 2019-05-15 MED ORDER — CEFAZOLIN SODIUM-DEXTROSE 2-4 GM/100ML-% IV SOLN
INTRAVENOUS | Status: AC
  Filled 2019-05-15: qty 100

## 2019-05-15 MED ORDER — ONDANSETRON HCL 4 MG/2ML IJ SOLN
INTRAMUSCULAR | Status: DC | PRN
  Administered 2019-05-15: 4 mg via INTRAVENOUS

## 2019-05-15 MED ORDER — ONDANSETRON HCL 4 MG/2ML IJ SOLN: 4.0000 mg | Freq: Once | INTRAMUSCULAR | Status: DC | PRN

## 2019-05-15 MED ORDER — FENTANYL CITRATE (PF) 100 MCG/2ML IJ SOLN
INTRAMUSCULAR | Status: AC
  Filled 2019-05-15: qty 2

## 2019-05-15 MED ORDER — SUCCINYLCHOLINE CHLORIDE 20 MG/ML IJ SOLN
INTRAMUSCULAR | Status: AC
  Filled 2019-05-15: qty 1

## 2019-05-15 MED ORDER — LACTATED RINGERS IV SOLN
INTRAVENOUS | Status: DC
  Administered 2019-05-15: 08:00:00 via INTRAVENOUS

## 2019-05-15 MED ORDER — MENTHOL 3 MG MT LOZG
1.0000 | LOZENGE | OROMUCOSAL | Status: DC | PRN
  Filled 2019-05-15: qty 9

## 2019-05-15 MED ORDER — ONDANSETRON HCL 4 MG/2ML IJ SOLN
INTRAMUSCULAR | Status: AC
  Filled 2019-05-15: qty 2

## 2019-05-15 MED ORDER — BISACODYL 10 MG RE SUPP: 10.0000 mg | Freq: Every day | RECTAL | Status: DC | PRN

## 2019-05-15 MED ORDER — FENTANYL CITRATE (PF) 100 MCG/2ML IJ SOLN
25.0000 ug | INTRAMUSCULAR | Status: DC | PRN
  Administered 2019-05-15: 50 ug via INTRAVENOUS
  Administered 2019-05-15: 25 ug via INTRAVENOUS
  Administered 2019-05-15 (×2): 50 ug via INTRAVENOUS
  Administered 2019-05-15: 25 ug via INTRAVENOUS

## 2019-05-15 MED ORDER — ACETAMINOPHEN 500 MG PO TABS
1000.0000 mg | ORAL_TABLET | Freq: Four times a day (QID) | ORAL | Status: DC
  Administered 2019-05-15 – 2019-05-16 (×3): 1000 mg via ORAL
  Filled 2019-05-15 (×3): qty 2

## 2019-05-15 MED ORDER — SUGAMMADEX SODIUM 200 MG/2ML IV SOLN
INTRAVENOUS | Status: DC | PRN
  Administered 2019-05-15: 151.6 mg via INTRAVENOUS

## 2019-05-15 MED ORDER — OXYCODONE HCL 5 MG PO TABS: 5.0000 mg | ORAL_TABLET | Freq: Once | ORAL | Status: DC | PRN

## 2019-05-15 MED ORDER — PROPOFOL 10 MG/ML IV BOLUS
INTRAVENOUS | Status: AC
  Filled 2019-05-15: qty 40

## 2019-05-15 MED ORDER — FENTANYL CITRATE (PF) 100 MCG/2ML IJ SOLN: 50.0000 ug | Freq: Once | INTRAMUSCULAR | Status: DC

## 2019-05-15 MED ORDER — BUPIVACAINE HCL (PF) 0.5 % IJ SOLN
INTRAMUSCULAR | Status: DC | PRN
  Administered 2019-05-15: 10 mL

## 2019-05-15 MED ORDER — OXYCODONE HCL 5 MG PO TABS: 5.0000 mg | ORAL_TABLET | ORAL | Status: DC | PRN

## 2019-05-15 MED ORDER — PANTOPRAZOLE SODIUM 40 MG PO TBEC: 40.0000 mg | DELAYED_RELEASE_TABLET | Freq: Every day | ORAL | Status: DC

## 2019-05-15 MED ORDER — SIMETHICONE 80 MG PO CHEW
80.0000 mg | CHEWABLE_TABLET | Freq: Four times a day (QID) | ORAL | Status: DC | PRN
  Administered 2019-05-15: 80 mg via ORAL
  Filled 2019-05-15: qty 1

## 2019-05-15 MED ORDER — MIDAZOLAM HCL 2 MG/2ML IJ SOLN
INTRAMUSCULAR | Status: AC
  Filled 2019-05-15: qty 2

## 2019-05-15 MED ORDER — ROCURONIUM BROMIDE 50 MG/5ML IV SOLN
INTRAVENOUS | Status: AC
  Filled 2019-05-15: qty 1

## 2019-05-15 MED ORDER — SENNOSIDES-DOCUSATE SODIUM 8.6-50 MG PO TABS: 1.0000 | ORAL_TABLET | Freq: Every evening | ORAL | Status: DC | PRN

## 2019-05-15 MED ORDER — DEXAMETHASONE SODIUM PHOSPHATE 10 MG/ML IJ SOLN
INTRAMUSCULAR | Status: AC
  Filled 2019-05-15: qty 1

## 2019-05-15 MED ORDER — FENTANYL CITRATE (PF) 100 MCG/2ML IJ SOLN
INTRAMUSCULAR | Status: AC
  Administered 2019-05-15: 25 ug via INTRAVENOUS
  Filled 2019-05-15: qty 2

## 2019-05-15 MED ORDER — ROCURONIUM BROMIDE 100 MG/10ML IV SOLN
INTRAVENOUS | Status: DC | PRN
  Administered 2019-05-15 (×2): 10 mg via INTRAVENOUS
  Administered 2019-05-15: 50 mg via INTRAVENOUS

## 2019-05-15 MED ORDER — FENTANYL CITRATE (PF) 100 MCG/2ML IJ SOLN
INTRAMUSCULAR | Status: AC
  Filled 2019-05-15: qty 4

## 2019-05-15 MED ORDER — PAROXETINE HCL 30 MG PO TABS
50.0000 mg | ORAL_TABLET | Freq: Every day | ORAL | Status: DC
  Filled 2019-05-15: qty 1

## 2019-05-15 MED ORDER — PHENYLEPHRINE HCL (PRESSORS) 10 MG/ML IV SOLN
INTRAVENOUS | Status: DC | PRN
  Administered 2019-05-15 (×2): 100 ug via INTRAVENOUS
  Administered 2019-05-15: 200 ug via INTRAVENOUS

## 2019-05-15 MED ORDER — ONDANSETRON HCL 4 MG PO TABS: 4.0000 mg | ORAL_TABLET | Freq: Four times a day (QID) | ORAL | Status: DC | PRN

## 2019-05-15 MED ORDER — LACTATED RINGERS IV SOLN
INTRAVENOUS | Status: DC
  Administered 2019-05-15: 16:00:00 via INTRAVENOUS

## 2019-05-15 MED ORDER — LACTATED RINGERS IV SOLN: INTRAVENOUS | Status: DC

## 2019-05-15 MED ORDER — OXYCODONE HCL 5 MG/5ML PO SOLN: 5.0000 mg | Freq: Once | ORAL | Status: DC | PRN

## 2019-05-15 MED ORDER — HYDROMORPHONE HCL 1 MG/ML IJ SOLN: 0.2000 mg | INTRAMUSCULAR | Status: DC | PRN

## 2019-05-15 MED ORDER — PROPOFOL 10 MG/ML IV BOLUS
INTRAVENOUS | Status: AC
  Filled 2019-05-15: qty 20

## 2019-05-15 MED ORDER — BACLOFEN 10 MG PO TABS
10.0000 mg | ORAL_TABLET | ORAL | Status: DC
  Administered 2019-05-16: 09:00:00 10 mg via ORAL
  Filled 2019-05-15: qty 1

## 2019-05-15 MED ORDER — FENTANYL CITRATE (PF) 100 MCG/2ML IJ SOLN
INTRAMUSCULAR | Status: DC | PRN
  Administered 2019-05-15: 50 ug via INTRAVENOUS
  Administered 2019-05-15: 25 ug via INTRAVENOUS
  Administered 2019-05-15: 100 ug via INTRAVENOUS
  Administered 2019-05-15: 25 ug via INTRAVENOUS

## 2019-05-15 MED ORDER — DEXAMETHASONE SODIUM PHOSPHATE 10 MG/ML IJ SOLN
INTRAMUSCULAR | Status: DC | PRN
  Administered 2019-05-15: 10 mg via INTRAVENOUS

## 2019-05-15 MED ORDER — MIDAZOLAM HCL 2 MG/2ML IJ SOLN
INTRAMUSCULAR | Status: DC | PRN
  Administered 2019-05-15: 2 mg via INTRAVENOUS

## 2019-05-15 MED ORDER — LAMOTRIGINE 100 MG PO TABS
200.0000 mg | ORAL_TABLET | ORAL | Status: DC
  Administered 2019-05-16: 200 mg via ORAL
  Filled 2019-05-15: qty 2

## 2019-05-15 MED ORDER — ONDANSETRON HCL 4 MG/2ML IJ SOLN: 4.0000 mg | Freq: Four times a day (QID) | INTRAMUSCULAR | Status: DC | PRN

## 2019-05-15 MED ORDER — LIDOCAINE HCL (PF) 2 % IJ SOLN
INTRAMUSCULAR | Status: AC
  Filled 2019-05-15: qty 10

## 2019-05-15 MED ORDER — PROPOFOL 10 MG/ML IV BOLUS
INTRAVENOUS | Status: DC | PRN
  Administered 2019-05-15: 180 mg via INTRAVENOUS

## 2019-05-15 MED ORDER — ACETAMINOPHEN 500 MG PO TABS
1000.0000 mg | ORAL_TABLET | ORAL | Status: AC
  Administered 2019-05-15: 1000 mg via ORAL

## 2019-05-15 MED ORDER — ZOLPIDEM TARTRATE 5 MG PO TABS: 5.0000 mg | ORAL_TABLET | Freq: Every evening | ORAL | Status: DC | PRN

## 2019-05-15 MED ORDER — ACETAMINOPHEN 500 MG PO TABS
ORAL_TABLET | ORAL | Status: AC
  Administered 2019-05-15: 1000 mg via ORAL
  Filled 2019-05-15: qty 2

## 2019-05-15 MED ORDER — ALUM & MAG HYDROXIDE-SIMETH 200-200-20 MG/5ML PO SUSP: 30.0000 mL | ORAL | Status: DC | PRN

## 2019-05-15 MED ORDER — SEVOFLURANE IN SOLN
RESPIRATORY_TRACT | Status: AC
  Filled 2019-05-15: qty 250

## 2019-05-15 MED ORDER — FENTANYL CITRATE (PF) 100 MCG/2ML IJ SOLN
INTRAMUSCULAR | Status: AC
  Administered 2019-05-15: 50 ug via INTRAVENOUS
  Filled 2019-05-15: qty 2

## 2019-05-15 MED ORDER — DOCUSATE SODIUM 100 MG PO CAPS
100.0000 mg | ORAL_CAPSULE | Freq: Two times a day (BID) | ORAL | Status: DC
  Administered 2019-05-15 (×2): 100 mg via ORAL
  Filled 2019-05-15 (×2): qty 1

## 2019-05-15 MED ORDER — MAGNESIUM CITRATE PO SOLN
1.0000 | Freq: Once | ORAL | Status: DC | PRN
  Filled 2019-05-15: qty 296

## 2019-05-15 MED ORDER — LIDOCAINE HCL (CARDIAC) PF 100 MG/5ML IV SOSY
PREFILLED_SYRINGE | INTRAVENOUS | Status: DC | PRN
  Administered 2019-05-15: 80 mg via INTRAVENOUS

## 2019-05-15 MED ORDER — METHOCARBAMOL 750 MG PO TABS
750.0000 mg | ORAL_TABLET | ORAL | Status: DC
  Administered 2019-05-16: 750 mg via ORAL
  Filled 2019-05-15: qty 1

## 2019-05-15 MED ORDER — DIPHENHYDRAMINE HCL 25 MG PO CAPS: 25.0000 mg | ORAL_CAPSULE | Freq: Four times a day (QID) | ORAL | Status: DC | PRN

## 2019-05-15 SURGICAL SUPPLY — 61 items
BAG URINE DRAINAGE (UROLOGICAL SUPPLIES) ×4 IMPLANT
BLADE SURG SZ11 CARB STEEL (BLADE) ×4 IMPLANT
CANISTER SUCT 1200ML W/VALVE (MISCELLANEOUS) ×4 IMPLANT
CATH FOLEY 2WAY  5CC 16FR (CATHETERS) ×2
CATH ROBINSON RED A/P 16FR (CATHETERS) ×4 IMPLANT
CATH URTH 16FR FL 2W BLN LF (CATHETERS) ×2 IMPLANT
CHLORAPREP W/TINT 26 (MISCELLANEOUS) ×4 IMPLANT
CLOSURE WOUND 1/2 X4 (GAUZE/BANDAGES/DRESSINGS) ×1
CORD MONOPOLAR M/FML 12FT (MISCELLANEOUS) ×4 IMPLANT
COVER WAND RF STERILE (DRAPES) ×4 IMPLANT
DERMABOND ADVANCED (GAUZE/BANDAGES/DRESSINGS) ×2
DERMABOND ADVANCED .7 DNX12 (GAUZE/BANDAGES/DRESSINGS) ×2 IMPLANT
DEVICE SUTURE ENDOST 10MM (ENDOMECHANICALS) ×2 IMPLANT
DRSG OPSITE POSTOP 3X4 (GAUZE/BANDAGES/DRESSINGS) ×4 IMPLANT
ELECT REM PT RETURN 9FT ADLT (ELECTROSURGICAL) ×4
ELECTRODE REM PT RTRN 9FT ADLT (ELECTROSURGICAL) ×2 IMPLANT
GLOVE BIO SURGEON STRL SZ 6.5 (GLOVE) ×3 IMPLANT
GLOVE BIO SURGEON STRL SZ8 (GLOVE) ×4 IMPLANT
GLOVE BIO SURGEONS STRL SZ 6.5 (GLOVE) ×1
GLOVE INDICATOR 7.0 STRL GRN (GLOVE) ×4 IMPLANT
GOWN STRL REUS W/ TWL LRG LVL3 (GOWN DISPOSABLE) ×4 IMPLANT
GOWN STRL REUS W/TWL LRG LVL3 (GOWN DISPOSABLE) ×4
GOWN STRL REUS W/TWL XL LVL4 (GOWN DISPOSABLE) ×4 IMPLANT
GRASPER SUT TROCAR 14GX15 (MISCELLANEOUS) ×4 IMPLANT
HANDLE YANKAUER SUCT BULB TIP (MISCELLANEOUS) ×2 IMPLANT
IRRIGATION STRYKERFLOW (MISCELLANEOUS) ×2 IMPLANT
IRRIGATOR STRYKERFLOW (MISCELLANEOUS) ×4
IV LACTATED RINGERS 1000ML (IV SOLUTION) ×4 IMPLANT
KIT PINK PAD W/HEAD ARE REST (MISCELLANEOUS) ×4
KIT PINK PAD W/HEAD ARM REST (MISCELLANEOUS) ×2 IMPLANT
KIT TURNOVER CYSTO (KITS) ×4 IMPLANT
LABEL OR SOLS (LABEL) ×4 IMPLANT
MANIPULATOR VCARE LG CRV RETR (MISCELLANEOUS) ×2 IMPLANT
MANIPULATOR VCARE SML CRV RETR (MISCELLANEOUS) IMPLANT
MANIPULATOR VCARE STD CRV RETR (MISCELLANEOUS) IMPLANT
NS IRRIG 500ML POUR BTL (IV SOLUTION) ×4 IMPLANT
OCCLUDER COLPOPNEUMO (BALLOONS) ×4 IMPLANT
PACK GYN LAPAROSCOPIC (MISCELLANEOUS) ×4 IMPLANT
PAD OB MATERNITY 4.3X12.25 (PERSONAL CARE ITEMS) ×4 IMPLANT
PAD PREP 24X41 OB/GYN DISP (PERSONAL CARE ITEMS) ×4 IMPLANT
POUCH ENDO CATCH 10MM SPEC (MISCELLANEOUS) IMPLANT
POUCH SPECIMEN RETRIEVAL 10MM (ENDOMECHANICALS) IMPLANT
SCISSORS METZENBAUM CVD 33 (INSTRUMENTS) ×4 IMPLANT
SET TUBE SMOKE EVAC HIGH FLOW (TUBING) ×4 IMPLANT
SHEARS HARMONIC ACE PLUS 36CM (ENDOMECHANICALS) ×6 IMPLANT
SLEEVE ENDOPATH XCEL 5M (ENDOMECHANICALS) ×6 IMPLANT
STRIP CLOSURE SKIN 1/2X4 (GAUZE/BANDAGES/DRESSINGS) ×3 IMPLANT
SUT ENDO VLOC 180-0-8IN (SUTURE) ×4 IMPLANT
SUT VIC AB 0 CT1 27 (SUTURE) ×2
SUT VIC AB 0 CT1 27XCR 8 STRN (SUTURE) IMPLANT
SUT VIC AB 0 CT2 27 (SUTURE) ×4 IMPLANT
SUT VIC AB 3-0 SH 27 (SUTURE)
SUT VIC AB 3-0 SH 27X BRD (SUTURE) IMPLANT
SUT VIC AB 4-0 FS2 27 (SUTURE) ×4 IMPLANT
SUT VICRYL 0 AB UR-6 (SUTURE) ×4 IMPLANT
SYR 10ML LL (SYRINGE) ×4 IMPLANT
SYR 50ML LL SCALE MARK (SYRINGE) ×4 IMPLANT
TROCAR ENDO BLADELESS 11MM (ENDOMECHANICALS) ×4 IMPLANT
TROCAR XCEL NON-BLD 5MMX100MML (ENDOMECHANICALS) ×4 IMPLANT
TROCAR XCEL UNIV SLVE 11M 100M (ENDOMECHANICALS) ×4 IMPLANT
TUBING EVAC SMOKE HEATED PNEUM (TUBING) ×4 IMPLANT

## 2019-05-15 NOTE — Interval H&P Note (Signed)
History and Physical Interval Note:  05/15/2019 7:58 AM  Erika Williamson  has presented today for surgery, with the diagnosis of DUB,H/O ENDOMETRIAL ABLATION, PELVIC PAIN, RIGHT OVARIAN CYST.  The various methods of treatment have been discussed with the patient and family. After consideration of risks, benefits and other options for treatment, the patient has consented to  Procedure(s): TOTAL LAPAROSCOPIC HYSTERECTOMY WITH BILATERAL SALPINGECTOMY (Bilateral), POSSIBLE LAPAROSCOPIC RIGHT SALPINGO OOPHORECTOMY (Right) as a surgical intervention.  The patient's history has been reviewed, patient examined, no change in status, stable for surgery.  I have reviewed the patient's chart and labs.  Questions were answered to the patient's satisfaction.     Rubie Maid, MD [facility]

## 2019-05-15 NOTE — Anesthesia Preprocedure Evaluation (Signed)
Anesthesia Evaluation  Patient identified by MRN, date of birth, ID band Patient awake    Reviewed: Allergy & Precautions, NPO status , Patient's Chart, lab work & pertinent test results  History of Anesthesia Complications Negative for: history of anesthetic complications  Airway Mallampati: II  TM Distance: >3 FB Neck ROM: Full    Dental no notable dental hx.    Pulmonary COPD, Current Smoker,    breath sounds clear to auscultation- rhonchi (-) wheezing      Cardiovascular (-) hypertension(-) CAD, (-) Past MI, (-) Cardiac Stents and (-) CABG  Rhythm:Regular Rate:Normal - Systolic murmurs and - Diastolic murmurs    Neuro/Psych  Headaches, PSYCHIATRIC DISORDERS Anxiety Depression    GI/Hepatic Neg liver ROS, hiatal hernia, GERD  ,  Endo/Other  negative endocrine ROSneg diabetes  Renal/GU negative Renal ROS     Musculoskeletal negative musculoskeletal ROS (+)   Abdominal (+) - obese,   Peds  Hematology negative hematology ROS (+)   Anesthesia Other Findings Past Medical History: 08/2013: Abnormal cervical Papanicolaou smear     Comment:  LGSIL pap, 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 No date: Anxiety No date: Atypical face pain     Comment:  Left Sided No date: Complication of anesthesia     Comment:  epidural for c secction-n/v and spinal for 2nd section               bp dropped No date: Depression No date: Family history of adverse reaction to anesthesia     Comment:  dad allergic to novacaine Left X2: Foot fracture No date: GERD (gastroesophageal reflux disease) No date: History of hiatal hernia     Comment:  small  No date: HPV (human papilloma virus) infection No date: Lupus ([place]) 04/03/2016: Migraine without aura 07/22/2015: Moderate recurrent major depression ([facility]) No date: Occipital neuralgia of right side No date: Shingles No date: Sinusitis No date: Tension headache No date: Tobacco abuse No date:  Tonsillitis No date: Vitamin D deficiency disease   Reproductive/Obstetrics                             Anesthesia Physical Anesthesia Plan  ASA: II  Anesthesia Plan: General   Post-op Pain Management:    Induction: Intravenous  PONV Risk Score and Plan: 1  Airway Management Planned: Oral ETT  Additional Equipment:   Intra-op Plan:   Post-operative Plan: Extubation in OR  Informed Consent: I have reviewed the patients History and Physical, chart, labs and discussed the procedure including the risks, benefits and alternatives for the proposed anesthesia with the patient or authorized representative who has indicated his/her understanding and acceptance.     Dental advisory given  Plan Discussed with: CRNA and Anesthesiologist  Anesthesia Plan Comments:         Anesthesia Quick Evaluation

## 2019-05-15 NOTE — Transfer of Care (Signed)
Immediate Anesthesia Transfer of Care Note  Patient: Erika Williamson  Procedure(s) Performed: TOTAL LAPAROSCOPIC HYSTERECTOMY WITH BILATERAL SALPINGECTOMY (Bilateral Abdomen) LAPAROSCOPIC RIGHT SALPINGO OOPHORECTOMY (Right Abdomen)  Patient Location: PACU  Anesthesia Type:General  Level of Consciousness: awake, alert  and oriented  Airway & Oxygen Therapy: Patient Spontanous Breathing and Patient connected to face mask oxygen  Post-op Assessment: Report given to RN and Post -op Vital signs reviewed and stable  Post vital signs: Reviewed and stable  Last Vitals:  Vitals Value Taken Time  BP 124/77 05/15/19 1322  Temp 36.3 C 05/15/19 1322  Pulse 85 05/15/19 1322  Resp 17 05/15/19 1326  SpO2 100 % 05/15/19 1322  Vitals shown include unvalidated device data.  Last Pain:  Vitals:   05/15/19 1322  TempSrc:   PainSc: 8       Patients Stated Pain Goal: 2 (16/10/96 0454)  Complications: No apparent anesthesia complications

## 2019-05-15 NOTE — Discharge Instructions (Signed)

## 2019-05-15 NOTE — Anesthesia Postprocedure Evaluation (Signed)
Anesthesia Post Note  Patient: Erika Williamson  Procedure(s) Performed: TOTAL LAPAROSCOPIC HYSTERECTOMY WITH BILATERAL SALPINGECTOMY (Bilateral Abdomen) LAPAROSCOPIC RIGHT SALPINGO OOPHORECTOMY (Right Abdomen)  Patient location during evaluation: PACU Anesthesia Type: General Level of consciousness: awake and alert Pain management: pain level controlled Vital Signs Assessment: post-procedure vital signs reviewed and stable Respiratory status: spontaneous breathing, nonlabored ventilation, respiratory function stable and patient connected to nasal cannula oxygen Cardiovascular status: blood pressure returned to baseline and stable Postop Assessment: no apparent nausea or vomiting Anesthetic complications: no     Last Vitals:  Vitals:   05/15/19 1420 05/15/19 1430  BP: 104/72 110/63  Pulse: 79 72  Resp: 17 10  Temp: (!) 36.2 C   SpO2: 99% 98%    Last Pain:  Vitals:   05/15/19 1420  TempSrc:   PainSc: Asleep                 Precious Haws Piscitello

## 2019-05-15 NOTE — Op Note (Addendum)
Procedure(s): TOTAL LAPAROSCOPIC HYSTERECTOMY WITH BILATERAL SALPINGECTOMY, RIGHT OVARIAN CYSTECTOMY  Procedure Note  Erika Williamson female 43 y.o. 05/15/2019  Indications: The patient is a 43 y.o. G35P2000 female with dysfunctional uterine bleeding, history of endometrial ablation, pelvic pain, and right ovarian cyst. History of previous tubal ligation.   Pre-operative Diagnosis: dysfunctional uterine bleeding, history of endometrial ablation, pelvic pain, and right ovarian cyst. History of tubal ligation  Post-operative Diagnosis: Same, with few abdominal adhesions  Surgeon: Rubie Maid, MD  Assistants: Surgical Scrub Assist.   Anesthesia: General endotracheal anesthesia  Findings: The uterus was sounded to 9 cm Fallopian tubes and ovaries appeared normal, with previous surgically interrupted fallopian tubes.  Small hemorrhagic cyst present on left ovary. Small area of omental adhesions to the anterior abdominal wall.  Bilateral ureters noted with efflux at end of procedure.   Procedure Details: The patient was seen in the Holding Room. The risks, benefits, complications, treatment options, and expected outcomes were discussed with the patient.  The patient concurred with the proposed plan, giving informed consent.  The site of surgery properly noted/marked. The patient was taken to the Operating Room, identified as Erika Williamson and the procedure verified as Procedure(s) (LRB): TOTAL LAPAROSCOPIC HYSTERECTOMY WITH BILATERAL SALPINGECTOMY (Bilateral) with POSSIBLE RIGHT OOPHORECTOMY.      The patient received intravenous antibiotics and had sequential compression devices applied to her lower extremities while in the preoperative area.  She was then taken to the operating room where general anesthesia was administered and was found to be adequate.  She was placed in the dorsal lithotomy position, and was prepped and draped in a sterile manner. A time-out was performed. A  foley catheter was placed.  A speculum was placed in the vagina and the anterior lip of the cervix was grasped using a single-tooth tenaculum.  The uterus was sounded to 9 cm.  A large V-Care uterine manipulator was placed at this time.  The tenaculum and speculum were removed. A Foley catheter was inserted into her bladder and attached to constant drainage.  Attention was turned to the abdomen where an umbilical incision was made with the scalpel.  The Optiview 5-mm trocar and sleeve were then advanced without difficulty with the laparoscope under direct visualization into the abdomen.  The abdomen was then insufflated with carbon dioxide gas and adequate pneumoperitoneum was obtained.  A survey of the patient's pelvis and abdomen revealed the findings above.  Bilateral lower quadrant ports (5 mm on the right and 5 mm on the left) were then placed under direct visualization.  The pelvis was then carefully examined.  Attention was turned to the abdominal adhesions of the omentum to the anterior abdominal wall.  The adhesions were lysed using the Harmonic scalpel. Attention was then turned to the fallopian tubes; these were freed from the underlying mesosalpinx and the uterine attachments using the Harmonic device.  The fallopian tubes had been previously surgically interrupted. The bilateral round and broad ligaments were then clamped and transected with the Harmonic device.  The uterine artery was then skeletonized and a bladder flap was created.  The ureters were noted to be safely away from the area of dissection.  The bladder was then bluntly dissected off the lower uterine segment.  At this point, attention was turned to the uterine vessels, which were clamped and ligated using the Harmonic scalpel.  Good hemostasis was noted overall.  The uterosacral and cardinal ligaments were clamped, cut and ligated bilaterally .  Attention was then  turned to the cervicovaginal junction, and the Harmonic device was used  to transect the cervix from the surrounding vagina using the ring of the V-Care as a guide. This was done circumferentially allowing total hysterectomy.  The uterus was then removed from the vagina and the vaginal cuff incision was then closed from below using figure-of-eight sutures of 0-Vicryl.  Overall excellent hemostasis was noted.  The ureters were reexamined bilaterally and were pulsating normally. The ovaries were visualized and cautery was applied to the left ovarian cyst (which appeared to be hemorrhagic, ~ 1-2 cm). Good hemostasis was noted throughout.   All trocars were removed under direct visualization, and the abdomen was desufflated.  All skin incisions were closed with 4-0 Vicryl subcuticular stitches and Dermabond. The incisions were injected with a total of 9 cc of 0.5 % Sensorcaine. The patient tolerated the procedures well.  All instruments, needles, and sponge counts were correct x 2. The patient was taken to the recovery room awake, extubated and in stable condition.   Estimated Blood Loss:  100 ml      Drains: foley catheterization with  300 ml of clear urine at end of the procedure.          Total IV Fluids:  1200 ml  Specimens: Uterus with cervix, bilateral fallopian tubes         Implants: None         Complications:  None; patient tolerated the procedure well.         Disposition: PACU - hemodynamically stable.         Condition: stable   Rubie Maid, MD [facility]

## 2019-05-15 NOTE — Anesthesia Procedure Notes (Signed)
Procedure Name: Intubation Date/Time: 05/15/2019 10:37 AM Performed by: Johnna Acosta, CRNA Pre-anesthesia Checklist: Patient identified, Emergency Drugs available, Suction available, Patient being monitored and Timeout performed Patient Re-evaluated:Patient Re-evaluated prior to induction Oxygen Delivery Method: Circle system utilized Preoxygenation: Pre-oxygenation with 100% oxygen Induction Type: IV induction Ventilation: Mask ventilation without difficulty Laryngoscope Size: Miller and 2 Grade View: Grade I Tube type: Oral Tube size: 7.0 mm Number of attempts: 1 Airway Equipment and Method: Stylet and Oral airway Placement Confirmation: ETT inserted through vocal cords under direct vision,  positive ETCO2 and breath sounds checked- equal and bilateral Secured at: 21 cm Tube secured with: Tape Dental Injury: Teeth and Oropharynx as per pre-operative assessment

## 2019-05-15 NOTE — Anesthesia Post-op Follow-up Note (Signed)
Anesthesia QCDR form completed.        

## 2019-05-16 DIAGNOSIS — N938 Other specified abnormal uterine and vaginal bleeding: Secondary | ICD-10-CM | POA: Diagnosis not present

## 2019-05-16 LAB — CBC
HCT: 40.2 % (ref 36.0–46.0)
Hemoglobin: 13.5 g/dL (ref 12.0–15.0)
MCH: 31.1 pg (ref 26.0–34.0)
MCHC: 33.6 g/dL (ref 30.0–36.0)
MCV: 92.6 fL (ref 80.0–100.0)
Platelets: 344 10*3/uL (ref 150–400)
RBC: 4.34 MIL/uL (ref 3.87–5.11)
RDW: 13.2 % (ref 11.5–15.5)
WBC: 18.2 10*3/uL — ABNORMAL HIGH (ref 4.0–10.5)
nRBC: 0 % (ref 0.0–0.2)

## 2019-05-16 LAB — CREATININE, SERUM
Creatinine, Ser: 0.5 mg/dL (ref 0.44–1.00)
GFR calc Af Amer: >60 mL/min (ref >60)
GFR calc non Af Amer: >60 mL/min (ref >60)

## 2019-05-16 MED ORDER — ACETAMINOPHEN 500 MG PO TABS: 1000.0000 mg | ORAL_TABLET | Freq: Three times a day (TID) | ORAL | 0 refills | Status: DC | PRN

## 2019-05-16 MED ORDER — HYDROMORPHONE HCL 2 MG PO TABS: 2.0000 mg | ORAL_TABLET | Freq: Four times a day (QID) | ORAL | 0 refills | Status: AC | PRN

## 2019-05-16 NOTE — Progress Notes (Signed)
Post-Operative Day # 1, s/p TLH with bilateral salpingectomy and left ovarian cystectomy  Subjective: no complaints, up ad lib, voiding, tolerating PO and + flatus. Patient does report a fever overnight, but thinks it was due to her lupus flaring up.  Was starting to flare the day before her surgery.   Objective: Temp:  [97.2 F (36.2 C)-99.3 F (37.4 C)] 98.4 F (36.9 C) (07/28 0738) Pulse Rate:  [72-100] 74 (07/28 0738) Resp:  [10-24] 20 (07/28 0738) BP: (100-130)/(63-95) 117/85 (07/28 0738) SpO2:  [94 %-100 %] 99 % (07/28 0738)  Physical Exam:  General: alert and no distress  Lungs: clear to auscultation bilaterally Heart: regular rate and rhythm, S1, S2 normal, no murmur, click, rub or gallop Abdomen: soft, non-tender; bowel sounds normal; no masses,  no organomegaly Pelvis:Bleeding: appropriate,  Incision: healing well, no significant drainage, no dehiscence, no significant erythema Extremities: DVT Evaluation: No evidence of DVT seen on physical exam. Negative Homan's sign. No cords or calf tenderness. No significant calf/ankle edema.   CBC Latest Ref Rng & Units 05/16/2019 05/11/2019 09/29/2017  WBC 4.0 - 10.5 K/uL 18.2(H) 11.6(H) WILL FOLLOW  Hemoglobin 12.0 - 15.0 g/dL 13.5 14.7 WILL FOLLOW  Hematocrit 36.0 - 46.0 % 40.2 42.4 WILL FOLLOW  Platelets 150 - 400 K/uL 344 357 WILL FOLLOW    Lab Results  Component Value Date   CREATININE 0.50 05/16/2019   CREATININE 0.47 05/11/2019   CREATININE 0.55 08/20/2017     Assessment/Plan: Patient doing well post-operatively.  Continue to encourage ambulation Regular diet as tolerated PO pain medication Elevated WBCs, likely due to lupus flare. Patient with subjective fever, but none documented on vitals.  Can d/c home today.     Rubie Maid, MD [facility]

## 2019-05-16 NOTE — Discharge Summary (Signed)
Gynecology Physician Postoperative Discharge Summary  Patient ID: Erika Williamson MRN: [id number] DOB/AGE: 1976-02-04 43 y.o.  Admit Date: 05/15/2019 Discharge Date: 05/16/2019  Preoperative Diagnoses: Dysfunctional uterine bleeding, pelvic pain, right ovarian cyst, history of endometrial ablation and tubal ligation.   Procedures: Procedure(s) (LRB): TOTAL LAPAROSCOPIC HYSTERECTOMY WITH BILATERAL SALPINGECTOMY (Bilateral) LAPAROSCOPIC RIGHT SALPINGO OOPHORECTOMY (Right)  Hospital Course:  Erika Williamson is a 43 y.o. G2P2000  admitted for scheduled surgery.  She underwent the procedures as mentioned above, her operation was uncomplicated. For further details about surgery, please refer to the operative report. Patient had an uncomplicated postoperative course. By time of discharge on POD#1, her pain was controlled on oral pain medications; she was ambulating, voiding without difficulty, tolerating regular diet and passing flatus. She was deemed stable for discharge to home.   Significant Labs: CBC Latest Ref Rng & Units 05/16/2019 05/11/2019 09/29/2017  WBC 4.0 - 10.5 K/uL 18.2(H) 11.6(H) WILL FOLLOW  Hemoglobin 12.0 - 15.0 g/dL 13.5 14.7 WILL FOLLOW  Hematocrit 36.0 - 46.0 % 40.2 42.4 WILL FOLLOW  Platelets 150 - 400 K/uL 344 357 WILL FOLLOW   Lab Results  Component Value Date   CREATININE 0.50 05/16/2019    Discharge Exam: Blood pressure 117/85, pulse 74, temperature 98.4 F (36.9 C), temperature source Oral, resp. rate 20, height 5\' 3"  (1.6 m), weight 75.8 kg, last menstrual period 04/27/2019, SpO2 99 %. General appearance: alert and no distress  Resp: clear to auscultation bilaterally  Cardio: regular rate and rhythm  GI: soft, non-tender; bowel sounds normal; no masses, no organomegaly.  Incision: C/D/I, no erythema, no drainage noted Pelvic: scant blood on pad  Extremities: extremities normal, atraumatic, no cyanosis or edema and Homans sign is negative, no sign of  DVT  Discharged Condition: Stable  Disposition:    Allergies as of 05/16/2019      Reactions   Gabapentin Anaphylaxis   Ivp Dye [iodinated Diagnostic Agents] Anaphylaxis   Ketorolac Anaphylaxis   Metrizamide Anaphylaxis   Naprosyn [naproxen] Anaphylaxis   Nsaids Anaphylaxis   Tolmetin Anaphylaxis   Tramadol Anaphylaxis   Erythromycin Other (See Comments)   Flu like symptoms   Famciclovir Other (See Comments)   unknown   Levofloxacin Other (See Comments)   Muscle aches and contractions    Seroquel [quetiapine Fumarate] Hives   Compazine [prochlorperazine Edisylate] Rash, Other (See Comments)   Muscle aches   Pristiq [desvenlafaxine] Rash, Other (See Comments)   Muscle aches      Medication List    TAKE these medications   acetaminophen 650 MG CR tablet Commonly known as: Tylenol 8 Hour Take 2 tablets (1,300 mg total) by mouth every 8 (eight) hours as needed for pain. What changed: Another medication with the same name was changed. Make sure you understand how and when to take each.   acetaminophen 500 MG tablet Commonly known as: TYLENOL Take 2 tablets (1,000 mg total) by mouth every 8 (eight) hours as needed. What changed:   how much to take  when to take this   baclofen 10 MG tablet Commonly known as: LIORESAL Take 10 mg by mouth every morning.   cetirizine 10 MG tablet Commonly known as: ZYRTEC Take 10 mg by mouth daily as needed for allergies.   esomeprazole 20 MG capsule Commonly known as: NEXIUM Take 20 mg by mouth daily as needed (acid reflux).   halobetasol 0.05 % ointment Commonly known as: ULTRAVATE Apply 1 application topically as needed for rash.   hydrocortisone 2.5 %  cream Apply 1 application topically 2 (two) times daily as needed (rash on face).   HYDROmorphone 2 MG tablet Commonly known as: Dilaudid Take 1 tablet (2 mg total) by mouth every 6 (six) hours as needed for up to 5 days for severe pain.   lamoTRIgine 200 MG  tablet Commonly known as: LAMICTAL Take 1 tablet (200 mg total) by mouth daily. What changed: when to take this   meclizine 12.5 MG tablet Commonly known as: ANTIVERT Take 1 tablet (12.5 mg total) by mouth 3 (three) times daily as needed for nausea.   methocarbamol 750 MG tablet Commonly known as: ROBAXIN Take 750 mg by mouth every morning.   PARoxetine 40 MG tablet Commonly known as: PAXIL Take 1.5 tablets (60 mg total) by mouth daily. What changed:   how much to take  when to take this   rizatriptan 10 MG tablet Commonly known as: MAXALT Take 10 mg by mouth as needed for migraine.   trolamine salicylate 10 % cream Commonly known as: ASPERCREME Apply 1 application topically as needed (neck pain).      No future appointments. Follow-up Information    Rubie Maid, MD Follow up in 2 week(s).   Specialties: Obstetrics and Gynecology, Radiology Why: post-op check Contact information: [place] [address] [phone]           Signed: Rubie Maid, MD [facility]

## 2019-05-16 NOTE — Progress Notes (Signed)
D/C instructions provided, pt states understanding, aware of follow up appt.  D/C home to car via auxiliary in wheelchair.

## 2019-05-19 LAB — SURGICAL PATHOLOGY

## 2019-05-30 ENCOUNTER — Encounter: Payer: Self-pay | Admitting: Obstetrics and Gynecology

## 2019-05-30 ENCOUNTER — Other Ambulatory Visit: Payer: Self-pay

## 2019-05-30 ENCOUNTER — Ambulatory Visit (INDEPENDENT_AMBULATORY_CARE_PROVIDER_SITE_OTHER): Payer: BC Managed Care – PPO | Admitting: Obstetrics and Gynecology

## 2019-05-30 VITALS — BP 131/83 | HR 96 | Ht 63.0 in | Wt 167.0 lb

## 2019-05-30 DIAGNOSIS — N809 Endometriosis, unspecified: Secondary | ICD-10-CM

## 2019-05-30 DIAGNOSIS — N8003 Adenomyosis of the uterus: Secondary | ICD-10-CM

## 2019-05-30 DIAGNOSIS — D251 Intramural leiomyoma of uterus: Secondary | ICD-10-CM

## 2019-05-30 DIAGNOSIS — Z90710 Acquired absence of both cervix and uterus: Secondary | ICD-10-CM

## 2019-05-30 DIAGNOSIS — Z4889 Encounter for other specified surgical aftercare: Secondary | ICD-10-CM

## 2019-05-30 NOTE — Progress Notes (Signed)
    OBSTETRICS/GYNECOLOGY POST-OPERATIVE CLINIC VISIT  Subjective:     Erika Williamson is a 43 y.o. G66P2002 female who presents to the clinic 2 weeks status post total laparoscopic hysterectomy with bilateral salpingectomy for abnormal uterine bleeding and pelvic pain. Eating a regular diet without difficulty. Bowel movements are normal. The patient is not having any pain.  The following portions of the patient's history were reviewed and updated as appropriate: allergies, current medications, past family history, past medical history, past social history, past surgical history and problem list.  Review of Systems Pertinent items noted in HPI and remainder of comprehensive ROS otherwise negative.    Objective:    BP 131/83   Pulse 96   Ht 5\' 3"  (1.6 m)   Wt 167 lb (75.8 kg)   BMI 29.58 kg/m  General:  alert and no distress  Abdomen: soft, bowel sounds active, non-tender  Incision:   healing well, no drainage, no erythema, no hernia, no seroma, no swelling, no dehiscence, incision well approximated    Pathology:   A. UTERUS WITH CERVIX AND BILATERAL FALLOPIAN TUBES; TOTAL HYSTERECTOMY  WITH BILATERAL SALPINGECTOMY:  - CERVIX WITH SQUAMOUS AND TUBAL METAPLASIA AND NABOTHIAN CYSTS.  - ENDOMETRIUM WITH CHANGES CONSISTENT WITH PRIOR ABLATION AND FOCAL  ADHERENT HEMORRHAGIC DEBRIS.  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  - BILATERAL FALLOPIAN TUBES WITH FIMBRIATED END AND BENIGN PARATUBAL  CYSTS; STATUS POST TUBAL LIGATION.  - NEGATIVE FOR ATYPIA AND MALIGNANCY.    Assessment:    Doing well postoperatively.   Plan:   1. Does not require any medications.  2. Wound care discussed. 3. Operative findings again reviewed. Pathology report discussed. 4. Activity restrictions: no bending, stooping, or squatting and no lifting more than 10 pounds 5. Anticipated return to work: 4 weeks. 6. Follow up: 4 weeks for final post-op check.      Rubie Maid, MD [facility]

## 2019-05-30 NOTE — Progress Notes (Signed)
Pt is present today for her 2 week post op after surgery. Pt stated that she is healing well. Pt stated incision are closing up well.

## 2019-06-01 ENCOUNTER — Telehealth: Payer: Self-pay

## 2019-06-01 NOTE — Telephone Encounter (Signed)
Pt is requesting FMLA paper to be faxed in today.   Pls contact when completed.

## 2019-06-02 NOTE — Telephone Encounter (Signed)
Pt was aware yesterday that paperwork has been faxed in.

## 2019-06-27 ENCOUNTER — Ambulatory Visit (INDEPENDENT_AMBULATORY_CARE_PROVIDER_SITE_OTHER): Payer: BC Managed Care – PPO | Admitting: Obstetrics and Gynecology

## 2019-06-27 ENCOUNTER — Encounter: Payer: Self-pay | Admitting: Obstetrics and Gynecology

## 2019-06-27 ENCOUNTER — Other Ambulatory Visit: Payer: Self-pay

## 2019-06-27 VITALS — BP 118/82 | HR 92 | Ht 63.0 in | Wt 163.1 lb

## 2019-06-27 DIAGNOSIS — Z90710 Acquired absence of both cervix and uterus: Secondary | ICD-10-CM

## 2019-06-27 DIAGNOSIS — Z4889 Encounter for other specified surgical aftercare: Secondary | ICD-10-CM

## 2019-06-27 NOTE — Progress Notes (Signed)
    OBSTETRICS/GYNECOLOGY POST-OPERATIVE CLINIC VISIT  Subjective:     Erika Williamson is a 43 y.o. G32P2002 female who presents to the clinic 6 weeks status post total laparoscopic hysterectomy with bilateral salpingectomy for abnormal uterine bleeding and pelvic pain. Had a prior endometrial ablation. Eating a regular diet without difficulty. Bowel movements are normal. The patient is not having any pain.  Patient notes that lately she has not been noting the sensation to empty her bladder.  Can go up to 10 hours without voiding. Notes prior to her hysterectomy she was having to go much more frequently (up to every 30 min-1 hr, sometimes with accidents. Reports adequate hydration  The following portions of the patient's history were reviewed and updated as appropriate: allergies, current medications, past family history, past medical history, past social history, past surgical history and problem list.  Review of Systems Pertinent items noted in HPI and remainder of comprehensive ROS otherwise negative.    Objective:    BP 118/82   Pulse 92   Ht 5\' 3"  (1.6 m)   Wt 163 lb 1.6 oz (74 kg)   LMP 04/27/2019 (Approximate)   BMI 28.89 kg/m  General:  alert and no distress  Abdomen: soft, bowel sounds active, non-tender  Incision:   healing well, no drainage, no erythema, no hernia, no seroma, no swelling, no dehiscence, incision well approximated    Pathology:  A. UTERUS WITH CERVIX AND BILATERAL FALLOPIAN TUBES; TOTAL HYSTERECTOMY  WITH BILATERAL SALPINGECTOMY:  - CERVIX WITH SQUAMOUS AND TUBAL METAPLASIA AND NABOTHIAN CYSTS.  - ENDOMETRIUM WITH CHANGES CONSISTENT WITH PRIOR ABLATION AND FOCAL  ADHERENT HEMORRHAGIC DEBRIS.  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  - BILATERAL FALLOPIAN TUBES WITH FIMBRIATED END AND BENIGN PARATUBAL  CYSTS; STATUS POST TUBAL LIGATION.  - NEGATIVE FOR ATYPIA AND MALIGNANCY.    Assessment:    Doing well  postoperatively.  S/p laparoscopic hysterectomy  Decreased bladder sensation  Plan:   1. Operative findings again reviewed. Pathology report discussed. 2. Activity restrictions: none 3. Anticipated return to work: now.  4. Discussed bladder training, timed voiding.  5. Follow up: 1-2 months for annual exam.    Rubie Maid, MD [facility]

## 2019-06-27 NOTE — Patient Instructions (Signed)

## 2019-06-27 NOTE — Progress Notes (Signed)
Pt is present for post-op visit. Pt stated that she is doing well other than not knowing when she has to urinate.

## 2019-08-10 ENCOUNTER — Encounter: Payer: Self-pay | Admitting: Family Medicine

## 2019-08-10 ENCOUNTER — Other Ambulatory Visit: Payer: Self-pay

## 2019-08-10 ENCOUNTER — Ambulatory Visit (INDEPENDENT_AMBULATORY_CARE_PROVIDER_SITE_OTHER): Payer: BC Managed Care – PPO | Admitting: Family Medicine

## 2019-08-10 DIAGNOSIS — Z91013 Allergy to seafood: Secondary | ICD-10-CM | POA: Diagnosis not present

## 2019-08-10 DIAGNOSIS — Z889 Allergy status to unspecified drugs, medicaments and biological substances status: Secondary | ICD-10-CM | POA: Diagnosis not present

## 2019-08-10 DIAGNOSIS — J0140 Acute pansinusitis, unspecified: Secondary | ICD-10-CM

## 2019-08-10 MED ORDER — DOXYCYCLINE HYCLATE 100 MG PO TABS: 100.0000 mg | ORAL_TABLET | Freq: Two times a day (BID) | ORAL | 0 refills | Status: AC

## 2019-08-10 MED ORDER — GUAIFENESIN ER 600 MG PO TB12: 600.0000 mg | ORAL_TABLET | Freq: Two times a day (BID) | ORAL | 0 refills | Status: DC

## 2019-08-10 MED ORDER — EPINEPHRINE 0.3 MG/0.3ML IJ SOAJ: 0.3000 mg | INTRAMUSCULAR | 3 refills | Status: AC | PRN

## 2019-08-10 NOTE — Progress Notes (Signed)
Name: Erika Williamson   MRN: [id number]    DOB: 10-18-1976   Date:08/10/2019       Progress Note  Subjective  Chief Complaint  Chief Complaint  Patient presents with   URI    fever 99.5, drainage, congested, facial pressure    I connected with  Erika Williamson on 08/10/19 at 10:40 AM EDT by telephone and verified that I am speaking with the correct person using two identifiers.   I discussed the limitations, risks, security and privacy concerns of performing an evaluation and management service by telephone and the availability of in person appointments. Staff also discussed with the patient that there may be a patient responsible charge related to this service. Patient Location: Home Provider Location: Office Additional Individuals present: None  HPI  Pt presents with concern for URI symptoms, she then started having sinus pain and pressure for the last 6 days.  She is having PND, facial pressure, bilateral maxillary and frontal sinus pain and pressure.  She came home from work a few days ago and did on-demand doctor who sent in azelastine nasal spray; Augmentin was also sent in to her which she has since started and is tolerating well - day 5 of Augmentin now.  After 2nd use of azelastine, her sinuses felt raw and very painful.  She has since been using benadryl and OTC cold medications.  She does not feel the Augmentin is helping - feels she is continuing to stay the same and possibly slightly worse.  No known COVID-19 exposure.  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   Cat bite of forearm 06/17/2016   Chronic obstructive pulmonary disease ([place]) 05/04/2016   Contact dermatitis due to poison oak 04/03/2016   Migraine without aura 04/03/2016   NSAIDs adverse reaction 09/09/2015   MDD (major depressive disorder), recurrent episode,  moderate (HCC) 07/22/2015   Gluten intolerance 06/19/2015   Tobacco abuse    Vitamin D deficiency disease    HPV (human papilloma virus) infection    High grade squamous intraepithelial cervical dysplasia    GERD (gastroesophageal reflux disease)     Social History   Tobacco Use   Smoking status: Current Every Day Smoker    Packs/day: 1.00    Years: 22.00    Pack years: 22.00    Types: Cigarettes    Start date: 09/03/1993   Smokeless tobacco: Never Used  Substance Use Topics   Alcohol use: Yes    Alcohol/week: 0.0 standard drinks    Comment: rare     Current Outpatient Medications:    acetaminophen (TYLENOL 8 HOUR) 650 MG CR tablet, Take 2 tablets (1,300 mg total) by mouth every 8 (eight) hours as needed for pain., Disp: 30 tablet, Rfl: 1   acetaminophen (TYLENOL) 500 MG tablet, Take 2 tablets (1,000 mg total) by mouth every 8 (eight) hours as needed., Disp: 30 tablet, Rfl: 0   amoxicillin-clavulanate (AUGMENTIN) 875-125 MG tablet, Take 1 tablet by mouth 2 (two) times daily., Disp: , Rfl:    baclofen (LIORESAL) 10 MG tablet, Take 10 mg by mouth every morning. , Disp: , Rfl:    cetirizine (ZYRTEC) 10 MG tablet, Take 10 mg by mouth daily as needed for allergies. , Disp: , Rfl:    esomeprazole (NEXIUM) 20 MG capsule, Take 20 mg by mouth daily as needed (acid reflux). , Disp: , Rfl:  halobetasol (ULTRAVATE) 0.05 % ointment, Apply 1 application topically as needed for rash., Disp: , Rfl:    hydrocortisone 2.5 % cream, Apply 1 application topically 2 (two) times daily as needed (rash on face)., Disp: , Rfl:    meclizine (ANTIVERT) 12.5 MG tablet, Take 1 tablet (12.5 mg total) by mouth 3 (three) times daily as needed for nausea., Disp: 30 tablet, Rfl: 0   methocarbamol (ROBAXIN) 750 MG tablet, Take 750 mg by mouth every morning. , Disp: , Rfl:    PARoxetine (PAXIL) 40 MG tablet, Take 1.5 tablets (60 mg total) by mouth daily. (Patient taking differently: Take 40 mg  by mouth every morning. ), Disp: , Rfl:    rizatriptan (MAXALT) 10 MG tablet, Take 10 mg by mouth as needed for migraine. , Disp: , Rfl:    trolamine salicylate (ASPERCREME) 10 % cream, Apply 1 application topically as needed (neck pain)., Disp: , Rfl:    Azelastine HCl 137 MCG/SPRAY SOLN, Place 2 sprays into both nostrils 2 (two) times daily., Disp: , Rfl:    lamoTRIgine (LAMICTAL) 200 MG tablet, Take 1 tablet (200 mg total) by mouth daily. (Patient taking differently: Take 200 mg by mouth every morning. ), Disp: 90 tablet, Rfl: 1  Allergies  Allergen Reactions   Gabapentin Anaphylaxis   Ivp Dye [Iodinated Diagnostic Agents] Anaphylaxis   Ketorolac Anaphylaxis   Metrizamide Anaphylaxis   Naprosyn [Naproxen] Anaphylaxis   Nsaids Anaphylaxis   Shellfish Allergy     Swollen face   Tolmetin Anaphylaxis   Tramadol Anaphylaxis   Erythromycin Other (See Comments)    Flu like symptoms   Famciclovir Other (See Comments)    unknown   Levofloxacin Other (See Comments)    Muscle aches and contractions    Seroquel [Quetiapine Fumarate] Hives   Compazine [Prochlorperazine Edisylate] Rash and Other (See Comments)    Muscle aches   Pristiq [Desvenlafaxine] Rash and Other (See Comments)    Muscle aches    I personally reviewed active problem list, medication list, allergies, notes from last encounter, lab results with the patient/caregiver today.  ROS  Ten systems reviewed and is negative except as mentioned in HPI  Objective  Virtual encounter, vitals not obtained.  There is no height or weight on file to calculate BMI.  Nursing Note and Vital Signs reviewed.  Physical Exam  Pulmonary/Chest: Effort normal. No respiratory distress. Speaking in complete sentences Neurological: Pt is alert and oriented to person, place, and time. Coordination, speech and gait are normal.  Psychiatric: Patient has a normal mood and affect. behavior is normal. Judgment and thought  content normal.  No results found for this or any previous visit (from the past 72 hour(s)).  Assessment & Plan  1. Acute non-recurrent pansinusitis - guaiFENesin (MUCINEX) 600 MG 12 hr tablet; Take 1 tablet (600 mg total) by mouth 2 (two) times daily.  Dispense: 20 tablet; Refill: 0 - doxycycline (VIBRA-TABS) 100 MG tablet; Take 1 tablet (100 mg total) by mouth 2 (two) times daily for 10 days.  Dispense: 20 tablet; Refill: 0 - She has taken doxycycline in the past and tolerated well with no allergic reactions; she has history of issues with Augmentin not working int he past and doxycycline working well as secondary option - see October 10 2018 note from Dr. Sanda Klein.  2. Shellfish allergy - EPINEPHrine (EPIPEN 2-PAK) 0.3 mg/0.3 mL IJ SOAJ injection; Inject 0.3 mLs (0.3 mg total) into the muscle as needed for anaphylaxis.  Dispense: 1 each; Refill:  3  3. Multiple drug allergies - EPINEPHrine (EPIPEN 2-PAK) 0.3 mg/0.3 mL IJ SOAJ injection; Inject 0.3 mLs (0.3 mg total) into the muscle as needed for anaphylaxis.  Dispense: 1 each; Refill: 3  -Red flags and when to present for emergency care or RTC including fever >101.72F, chest pain, shortness of breath, new/worsening/un-resolving symptoms, reviewed with patient at time of visit. Follow up and care instructions discussed and provided in AVS. - I discussed the assessment and treatment plan with the patient. The patient was provided an opportunity to ask questions and all were answered. The patient agreed with the plan and demonstrated an understanding of the instructions.  - The patient was advised to call back or seek an in-person evaluation if the symptoms worsen or if the condition fails to improve as anticipated.  I provided 16 minutes of non-face-to-face time during this encounter.  Hubbard Hartshorn, FNP

## 2019-08-23 ENCOUNTER — Ambulatory Visit (INDEPENDENT_AMBULATORY_CARE_PROVIDER_SITE_OTHER): Payer: BC Managed Care – PPO | Admitting: Obstetrics and Gynecology

## 2019-08-23 ENCOUNTER — Other Ambulatory Visit: Payer: Self-pay

## 2019-08-23 ENCOUNTER — Encounter: Payer: Self-pay | Admitting: Obstetrics and Gynecology

## 2019-08-23 VITALS — BP 134/83 | HR 80 | Ht 63.0 in | Wt 161.5 lb

## 2019-08-23 DIAGNOSIS — Z90710 Acquired absence of both cervix and uterus: Secondary | ICD-10-CM | POA: Diagnosis not present

## 2019-08-23 DIAGNOSIS — Z13220 Encounter for screening for lipoid disorders: Secondary | ICD-10-CM | POA: Diagnosis not present

## 2019-08-23 DIAGNOSIS — Z131 Encounter for screening for diabetes mellitus: Secondary | ICD-10-CM | POA: Diagnosis not present

## 2019-08-23 DIAGNOSIS — Z90721 Acquired absence of ovaries, unilateral: Secondary | ICD-10-CM

## 2019-08-23 DIAGNOSIS — Z01419 Encounter for gynecological examination (general) (routine) without abnormal findings: Secondary | ICD-10-CM

## 2019-08-23 DIAGNOSIS — E7800 Pure hypercholesterolemia, unspecified: Secondary | ICD-10-CM

## 2019-08-23 DIAGNOSIS — E663 Overweight: Secondary | ICD-10-CM

## 2019-08-23 NOTE — Progress Notes (Signed)
Pt is present for annual exam. Pt stated that she was doing well no problems.  

## 2019-08-23 NOTE — Progress Notes (Signed)
GYNECOLOGY ANNUAL PHYSICAL EXAM PROGRESS NOTE  Subjective:    Erika Williamson is a 43 y.o. G19P2000 female who presents for an annual exam. The patient has no complaints today. The patient is not currently sexually active (husband is away serving in the [employer]).  She is status post total laparoscopic hysterectomy with bilateral salpingectomy for abnormal uterine bleeding and pelvic pain in July 2020. Overall doing well. The patient wears seatbelts: yes. The patient participates in regular exercise: no. Has the patient ever been transfused or tattooed?: yes. The patient reports that there is not domestic violence in her life.    Gynecologic History  Menarche age: 66 Patient's last menstrual period was 04/27/2019 (approximate). Contraception: none History of STI's: Denies Last Pap: 08/03/2017. Results were: normal.  Reports h/o abnormal pap smears. Last mammogram: Patient has not had mammogram. Does not desire to start yet.   OB History  Gravida Para Term Preterm AB Living  2 2 2  0 0 0  SAB TAB Ectopic Multiple Live Births  0 0 0 0 0    # Outcome Date GA Lbr Len/2nd Weight Sex Delivery Anes PTL Lv  2 Term      CS-LTranv     1 Term      CS-LTranv       Obstetric Comments  1st Menstrual Cycle:  10  1st Pregnancy:  18    Past Medical History:  Diagnosis Date  . 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 Anxiety   . Atypical face pain    Left Sided  . Complication of anesthesia    epidural for c secction-n/v and spinal for 2nd section bp dropped  . Depression   . Family history of adverse reaction to anesthesia    dad allergic to novacaine  . Foot fracture Left X2  . GERD (gastroesophageal reflux disease)   . History of hiatal hernia    small   . HPV (human papilloma virus) infection   . Lupus ([place])   . Migraine without aura 04/03/2016  . Moderate recurrent major depression ([place]) 07/22/2015  . Occipital  neuralgia of right side   . Shingles   . Sinusitis   . Tension headache   . Tobacco abuse   . Tonsillitis   . Vitamin D deficiency disease     Past Surgical History:  Procedure Laterality Date  . birth control removed  06/09/2017  . CESAREAN SECTION     X2  . CHOLECYSTECTOMY     Dr Burt Knack  . COLONOSCOPY WITH PROPOFOL N/A 05/03/2015   Procedure: COLONOSCOPY WITH PROPOFOL;  Surgeon: Josefine Class, MD;  Location: [facility] ENDOSCOPY;  Service: Endoscopy;  Laterality: N/A;  . DILITATION & CURRETTAGE/HYSTROSCOPY WITH NOVASURE ABLATION N/A 08/24/2017   Procedure: DILATATION & CURETTAGE/HYSTEROSCOPY (NO ABLATION DONE);  Surgeon: Rubie Maid, MD;  Location: [facility] ORS;  Service: Gynecology;  Laterality: N/A;  . DILITATION & CURRETTAGE/HYSTROSCOPY WITH THERMACHOICE ABLATION N/A 09/22/2017   Procedure: DILATATION & CURETTAGE/HYSTEROSCOPY WITH [employer]ES[employer] HTA  [place];  Surgeon: Rubie Maid, MD;  Location: [facility] ORS;  Service: Gynecology;  Laterality: N/A;  . ESOPHAGOGASTRODUODENOSCOPY N/A 05/03/2015   Procedure: ESOPHAGOGASTRODUODENOSCOPY (EGD);  Surgeon: Josefine Class, MD;  Location: [facility] ENDOSCOPY;  Service: Endoscopy;  Laterality: N/A;  . LAPAROSCOPIC BILATERAL SALPINGO OOPHERECTOMY Right 05/15/2019  . 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 TONSILLECTOMY    . TOTAL LAPAROSCOPIC HYSTERECTOMY WITH SALPINGECTOMY AND RIGHT  OOPHORECTOMY Bilateral 05/15/2019   Procedure: TOTAL LAPAROSCOPIC HYSTERECTOMY WITH BILATERAL SALPINGECTOMY;  Surgeon: Rubie Maid, MD;  Location: [facility] ORS;  Service: Gynecology;  Laterality: Bilateral;  . TUBAL LIGATION    . WISDOM TOOTH EXTRACTION      Family History  Problem Relation Age of Onset  . Diabetes Mother   . Hypertension Mother   . COPD Mother   . Stroke Mother   . Osteoporosis Mother   . Depression Mother   . Cancer Father 64       colorectal, spread to lung  . Alcohol abuse Father   . Hypertension Father   . Depression Father   .  Diabetes Sister   . COPD Sister   . Anxiety disorder Sister   . Depression Sister   . Fibromyalgia Sister   . Asthma Sister   . ADD / ADHD Son   . ADD / ADHD Daughter     Social History   Socioeconomic History  . Marital status: Divorced    Spouse name: Not on file  . Number of children: 2  . Years of education: Not on file  . Highest education level: Not on file  Occupational History  . Not on file  Social Needs  . Financial resource strain: Not hard at all  . Food insecurity    Worry: Never true    Inability: Never true  . Transportation needs    Medical: No    Non-medical: No  Tobacco Use  . Smoking status: Current Every Day Smoker    Packs/day: 1.00    Years: 22.00    Pack years: 22.00    Types: Cigarettes    Start date: 09/03/1993  . Smokeless tobacco: Never Used  Substance and Sexual Activity  . Alcohol use: Yes    Alcohol/week: 0.0 standard drinks    Comment: rare  . Drug use: No  . Sexual activity: Not Currently    Partners: Male    Birth control/protection: None  Lifestyle  . Physical activity    Days per week: 0 days    Minutes per session: 0 min  . Stress: Not at all  Relationships  . Social Herbalist on phone: Three times a week    Gets together: Once a week    Attends religious service: Never    Active member of club or organization: No    Attends meetings of clubs or organizations: Never    Relationship status: Never married  . Intimate partner violence    Fear of current or ex partner: No    Emotionally abused: No    Physically abused: No    Forced sexual activity: No  Other Topics Concern  . Not on file  Social History Narrative  . Not on file    Current Outpatient Medications on File Prior to Visit  Medication Sig Dispense Refill  . acetaminophen (TYLENOL 8 HOUR) 650 MG CR tablet Take 2 tablets (1,300 mg total) by mouth every 8 (eight) hours as needed for pain. 30 tablet 1  . acetaminophen (TYLENOL) 500 MG tablet Take  2 tablets (1,000 mg total) by mouth every 8 (eight) hours as needed. 30 tablet 0  . Azelastine HCl 137 MCG/SPRAY SOLN Place 2 sprays into both nostrils 2 (two) times daily.    . baclofen (LIORESAL) 10 MG tablet Take 10 mg by mouth every morning.     . cetirizine (ZYRTEC) 10 MG tablet Take 10 mg by mouth daily as needed for  allergies.     Marland Kitchen EPINEPHrine (EPIPEN 2-PAK) 0.3 mg/0.3 mL IJ SOAJ injection Inject 0.3 mLs (0.3 mg total) into the muscle as needed for anaphylaxis. 1 each 3  . esomeprazole (NEXIUM) 20 MG capsule Take 20 mg by mouth daily as needed (acid reflux).     Marland Kitchen guaiFENesin (MUCINEX) 600 MG 12 hr tablet Take 1 tablet (600 mg total) by mouth 2 (two) times daily. 20 tablet 0  . halobetasol (ULTRAVATE) 0.05 % ointment Apply 1 application topically as needed for rash.    . hydrocortisone 2.5 % cream Apply 1 application topically 2 (two) times daily as needed (rash on face).    Marland Kitchen lamoTRIgine (LAMICTAL) 200 MG tablet Take 1 tablet (200 mg total) by mouth daily. (Patient taking differently: Take 200 mg by mouth every morning. ) 90 tablet 1  . meclizine (ANTIVERT) 12.5 MG tablet Take 1 tablet (12.5 mg total) by mouth 3 (three) times daily as needed for nausea. 30 tablet 0  . methocarbamol (ROBAXIN) 750 MG tablet Take 750 mg by mouth every morning.     Marland Kitchen PARoxetine (PAXIL) 40 MG tablet Take 1.5 tablets (60 mg total) by mouth daily. (Patient taking differently: Take 40 mg by mouth every morning. )    . rizatriptan (MAXALT) 10 MG tablet Take 10 mg by mouth as needed for migraine.     . trolamine salicylate (ASPERCREME) 10 % cream Apply 1 application topically as needed (neck pain).     No current facility-administered medications on file prior to visit.     Allergies  Allergen Reactions  . Gabapentin Anaphylaxis  . Ivp Dye [Iodinated Diagnostic Agents] Anaphylaxis  . Ketorolac Anaphylaxis  . Metrizamide Anaphylaxis  . Naprosyn [Naproxen] Anaphylaxis  . Nsaids Anaphylaxis  . Shellfish  Allergy     Swollen face  . Tolmetin Anaphylaxis  . Tramadol Anaphylaxis  . Erythromycin Other (See Comments)    Flu like symptoms  . Famciclovir Other (See Comments)    unknown  . Levofloxacin Other (See Comments)    Muscle aches and contractions   . Seroquel [Quetiapine Fumarate] Hives  . Compazine [Prochlorperazine Edisylate] Rash and Other (See Comments)    Muscle aches  . Pristiq [Desvenlafaxine] Rash and Other (See Comments)    Muscle aches      Review of Systems Constitutional: negative for chills, fatigue, fevers and sweats Eyes: negative for irritation, redness and visual disturbance Ears, nose, mouth, throat, and face: negative for hearing loss, nasal congestion, snoring and tinnitus Respiratory: negative for asthma, cough, sputum Cardiovascular: negative for chest pain, dyspnea, exertional chest pressure/discomfort, irregular heart beat, palpitations and syncope Gastrointestinal: negative for abdominal pain, change in bowel habits, nausea and vomiting Genitourinary: negative for abnormal menstrual periods, genital lesions, sexual problems and vaginal discharge, dysuria and urinary incontinence Integument/breast: negative for breast lump, breast tenderness and nipple discharge Hematologic/lymphatic: negative for bleeding and easy bruising Musculoskeletal:negative for back pain and muscle weakness Neurological: negative for dizziness, headaches, vertigo and weakness Endocrine: negative for diabetic symptoms including polydipsia, polyuria and skin dryness Allergic/Immunologic: negative for hay fever and urticaria      Objective:  Blood pressure 134/83, pulse 80, height 5\' 3"  (1.6 m), weight 161 lb 8 oz (73.3 kg), last menstrual period 04/27/2019. Body mass index is 28.61 kg/m.  General Appearance:    Alert, cooperative, no distress, appears stated age, overweight  Head:    Normocephalic, without obvious abnormality, atraumatic  Eyes:    PERRL, conjunctiva/corneas  clear, EOM's intact, both eyes  Ears:    Normal external ear canals, both ears  Nose:   Nares normal, septum midline, mucosa normal, no drainage or sinus tenderness  Throat:   Lips, mucosa, and tongue normal; teeth and gums normal  Neck:   Supple, symmetrical, trachea midline, no adenopathy; thyroid: no enlargement/tenderness/nodules; no carotid bruit or JVD  Back:     Symmetric, no curvature, ROM normal, no CVA tenderness  Lungs:     Clear to auscultation bilaterally, respirations unlabored  Chest Wall:    No tenderness or deformity   Heart:    Regular rate and rhythm, S1 and S2 normal, no murmur, rub or gallop  Breast Exam:    No tenderness, masses, or nipple abnormality  Abdomen:     Soft, non-tender, bowel sounds active all four quadrants, no masses, no organomegaly.    Genitalia:    Pelvic:external genitalia normal, vagina without lesions, discharge, or tenderness, rectovaginal septum  normal. Cervix normal in appearance, no cervical motion tenderness, no adnexal masses or tenderness.  Uterus normal size, shape, mobile, regular contours, nontender.  Rectal:    Normal external sphincter.  No hemorrhoids appreciated. Internal exam not done.   Extremities:   Extremities normal, atraumatic, no cyanosis or edema  Pulses:   2+ and symmetric all extremities  Skin:   Skin color, texture, turgor normal, no rashes or lesions  Lymph nodes:   Cervical, supraclavicular, and axillary nodes normal  Neurologic:   CNII-XII intact, normal strength, sensation and reflexes throughout   .  Labs:  Lab Results  Component Value Date   WBC 18.2 (H) 05/16/2019   HGB 13.5 05/16/2019   HCT 40.2 05/16/2019   MCV 92.6 05/16/2019   PLT 344 05/16/2019    Lab Results  Component Value Date   CREATININE 0.50 05/16/2019   BUN <5 (L) 05/11/2019   NA 135 05/11/2019   K 3.6 05/11/2019   CL 100 05/11/2019   CO2 26 05/11/2019    Lab Results  Component Value Date   ALT 15 05/11/2019   AST 18 05/11/2019    ALKPHOS 61 05/11/2019   BILITOT 0.5 05/11/2019    Lab Results  Component Value Date   TSH 1.670 09/29/2017    Lab Results  Component Value Date   CHOL 214 (H) 09/29/2017     Assessment:    Healthy female exam.   Status post TLH/RSO  Overweight  Hypercholesterolemia  Plan:     Labs: HgbA1c, Lipid panel, TSH.   Breast self exam technique reviewed and patient encouraged to perform self-exam monthly. Contraception: status post hysterectomy. Discussed healthy lifestyle modifications. Mammogram: patient declines at this time. Notes she will begin at age 35. Pap smear up to date. No further pap smears needed as patient is s/p hysterectomy.  Declines flu vaccine.  Hypercholesterolemia, mild, encouraged dietary modification and exercise. Follow up in 1 year.    Rubie Maid, MD [facility]

## 2019-08-23 NOTE — Patient Instructions (Signed)
Health Maintenance, Female Adopting a healthy lifestyle and getting preventive care are important in promoting health and wellness. Ask your health care provider about:  The right schedule for you to have regular tests and exams.  Things you can do on your own to prevent diseases and keep yourself healthy. What should I know about diet, weight, and exercise? Eat a healthy diet   Eat a diet that includes plenty of vegetables, fruits, low-fat dairy products, and lean protein.  Do not eat a lot of foods that are high in solid fats, added sugars, or sodium. Maintain a healthy weight Body mass index (BMI) is used to identify weight problems. It estimates body fat based on height and weight. Your health care provider can help determine your BMI and help you achieve or maintain a healthy weight. Get regular exercise Get regular exercise. This is one of the most important things you can do for your health. Most adults should:  Exercise for at least 150 minutes each week. The exercise should increase your heart rate and make you sweat (moderate-intensity exercise).  Do strengthening exercises at least twice a week. This is in addition to the moderate-intensity exercise.  Spend less time sitting. Even light physical activity can be beneficial. Watch cholesterol and blood lipids Have your blood tested for lipids and cholesterol at 44 years of age, then have this test every 5 years. Have your cholesterol levels checked more often if:  Your lipid or cholesterol levels are high.  You are older than 44 years of age.  You are at high risk for heart disease. What should I know about cancer screening? Depending on your health history and family history, you may need to have cancer screening at various ages. This may include screening for:  Breast cancer.  Cervical cancer.  Colorectal cancer.  Skin cancer.  Lung cancer. What should I know about heart disease, diabetes, and high blood  pressure? Blood pressure and heart disease  High blood pressure causes heart disease and increases the risk of stroke. This is more likely to develop in people who have high blood pressure readings, are of African descent, or are overweight.  Have your blood pressure checked: ? Every 3-5 years if you are 18-39 years of age. ? Every year if you are 40 years old or older. Diabetes Have regular diabetes screenings. This checks your fasting blood sugar level. Have the screening done:  Once every three years after age 40 if you are at a normal weight and have a low risk for diabetes.  More often and at a younger age if you are overweight or have a high risk for diabetes. What should I know about preventing infection? Hepatitis B If you have a higher risk for hepatitis B, you should be screened for this virus. Talk with your health care provider to find out if you are at risk for hepatitis B infection. Hepatitis C Testing is recommended for:  Everyone born from 1945 through 1965.  Anyone with known risk factors for hepatitis C. Sexually transmitted infections (STIs)  Get screened for STIs, including gonorrhea and chlamydia, if: ? You are sexually active and are younger than 44 years of age. ? You are older than 44 years of age and your health care provider tells you that you are at risk for this type of infection. ? Your sexual activity has changed since you were last screened, and you are at increased risk for chlamydia or gonorrhea. Ask your health care provider if   you are at risk.  Ask your health care provider about whether you are at high risk for HIV. Your health care provider may recommend a prescription medicine to help prevent HIV infection. If you choose to take medicine to prevent HIV, you should first get tested for HIV. You should then be tested every 3 months for as long as you are taking the medicine. Pregnancy  If you are about to stop having your period (premenopausal) and  you may become pregnant, seek counseling before you get pregnant.  Take 400 to 800 micrograms (mcg) of folic acid every day if you become pregnant.  Ask for birth control (contraception) if you want to prevent pregnancy. Osteoporosis and menopause Osteoporosis is a disease in which the bones lose minerals and strength with aging. This can result in bone fractures. If you are 65 years old or older, or if you are at risk for osteoporosis and fractures, ask your health care provider if you should:  Be screened for bone loss.  Take a calcium or vitamin D supplement to lower your risk of fractures.  Be given hormone replacement therapy (HRT) to treat symptoms of menopause. Follow these instructions at home: Lifestyle  Do not use any products that contain nicotine or tobacco, such as cigarettes, e-cigarettes, and chewing tobacco. If you need help quitting, ask your health care provider.  Do not use street drugs.  Do not share needles.  Ask your health care provider for help if you need support or information about quitting drugs. Alcohol use  Do not drink alcohol if: ? Your health care provider tells you not to drink. ? You are pregnant, may be pregnant, or are planning to become pregnant.  If you drink alcohol: ? Limit how much you use to 0-1 drink a day. ? Limit intake if you are breastfeeding.  Be aware of how much alcohol is in your drink. In the [place] one drink equals one 12 oz bottle of beer (355 mL), one 5 oz glass of wine (148 mL), or one 1 oz glass of hard liquor (44 mL). General instructions  Schedule regular health, dental, and eye exams.  Stay current with your vaccines.  Tell your health care provider if: ? You often feel depressed. ? You have ever been abused or do not feel safe at home. Summary  Adopting a healthy lifestyle and getting preventive care are important in promoting health and wellness.  Follow your health care provider's instructions about healthy  diet, exercising, and getting tested or screened for diseases.  Follow your health care provider's instructions on monitoring your cholesterol and blood pressure. This information is not intended to replace advice given to you by your health care provider. Make sure you discuss any questions you have with your health care provider. Document Released: 04/20/2011 Document Revised: 09/28/2018 Document Reviewed: 09/28/2018 Elsevier Patient Education  2020 Elsevier Inc.  

## 2019-08-24 LAB — LIPID PANEL
Chol/HDL Ratio: 2.4 ratio (ref 0.0–4.4)
Cholesterol, Total: 174 mg/dL (ref 100–199)
HDL: 72 mg/dL (ref >39)
LDL Chol Calc (NIH): 93 mg/dL (ref 0–99)
Triglycerides: 43 mg/dL (ref 0–149)
VLDL Cholesterol Cal: 9 mg/dL (ref 5–40)

## 2019-08-24 LAB — TSH: TSH: 1.88 u[IU]/mL (ref 0.450–4.500)

## 2019-08-24 LAB — HEMOGLOBIN A1C
Est. average glucose Bld gHb Est-mCnc: 105 mg/dL
Hgb A1c MFr Bld: 5.3 % (ref 4.8–5.6)

## 2019-10-18 ENCOUNTER — Telehealth: Payer: BC Managed Care – PPO | Admitting: Family

## 2019-10-18 DIAGNOSIS — R0602 Shortness of breath: Secondary | ICD-10-CM

## 2019-10-18 NOTE — Progress Notes (Signed)
Based on what you shared with me, I feel your condition warrants further evaluation and I recommend that you be seen for a face to face office visit.  Given your shortness of breath and chest tightness you need to be seen face-to-face to rule out other serious conditions.   NOTE: If you entered your credit card information for this eVisit, you will not be charged. You may see a "hold" on your card for the $35 but that hold will drop off and you will not have a charge processed.   If you are having a true medical emergency please call 911.      For an urgent face to face visit, [place] has five urgent care centers for your convenience:      NEW:  [facility] [place] [facility] [place] Get Driving Directions [id number] [place], [place] [address] . 10 am - 6pm Monday - Friday    [place] [facility]) Get Driving Directions [id number] [address] [place], [place] [address] . 10 am to 8 pm Monday-Friday . 12 pm to 8 pm [facility] Get Driving Directions [id number] [place], [place], [place] [address] . 8 am to 8 pm Monday-Friday . 9 am to 6 pm Saturday . 11 am to 6 pm Sunday     [facility] at [facility] Get Driving Directions  [id number] [address] [place], [place] [address] . 8 am to 8 pm Monday-Friday . 8 am to 4 pm [facility] at [place] Get Driving Directions [id number] [address] [place], [place] [address] . 12 pm to 6 pm Monday-Friday      Your e-visit answers were reviewed by a board certified advanced clinical practitioner to complete your personal care plan.  Thank you for using e-Visits.

## 2019-12-14 ENCOUNTER — Ambulatory Visit (INDEPENDENT_AMBULATORY_CARE_PROVIDER_SITE_OTHER): Payer: BC Managed Care – PPO | Admitting: Internal Medicine

## 2019-12-14 ENCOUNTER — Telehealth: Payer: Self-pay | Admitting: Family Medicine

## 2019-12-14 ENCOUNTER — Encounter: Payer: Self-pay | Admitting: Internal Medicine

## 2019-12-14 DIAGNOSIS — L930 Discoid lupus erythematosus: Secondary | ICD-10-CM | POA: Diagnosis not present

## 2019-12-14 DIAGNOSIS — M791 Myalgia, unspecified site: Secondary | ICD-10-CM | POA: Diagnosis not present

## 2019-12-14 DIAGNOSIS — M2550 Pain in unspecified joint: Secondary | ICD-10-CM | POA: Diagnosis not present

## 2019-12-14 DIAGNOSIS — E559 Vitamin D deficiency, unspecified: Secondary | ICD-10-CM

## 2019-12-14 DIAGNOSIS — R509 Fever, unspecified: Secondary | ICD-10-CM

## 2019-12-14 NOTE — Telephone Encounter (Signed)
Copied from [place] [id number]. Topic: General - Other >> Dec 14, 2019  9:18 AM Keene Breath wrote: Reason for CRM: Patient would like the nurse to call her regarding her Vitamin D level.  Patient believes it is low and she has Lupus and is concerned.  CB# [phone]

## 2019-12-14 NOTE — Telephone Encounter (Signed)
Patient notified she need to make appointment for virtual.

## 2019-12-14 NOTE — Progress Notes (Signed)
Name: Erika Williamson   MRN: [id number]    DOB: November 10, 1975   Date:12/14/2019       Progress Note  Subjective  Chief Complaint  Chief Complaint  Patient presents with  . Generalized Body Aches    I connected with  Erika Williamson  on 12/14/19 at  2:00 PM EST by a video enabled telemedicine application and verified that I am speaking with the correct person using two identifiers.  I discussed the limitations of evaluation and management by telemedicine and the availability of in person appointments. The patient expressed understanding and agreed to proceed. Staff also discussed with the patient that there may be a patient responsible charge related to this service. Patient Location: Car on lunch at work Provider Location: [facility] Additional Individuals present: none  HPI Patient is a 44 year old female who was noted to be doing well on her encounter for a well woman's exam on 08/23/2019 with OB/GYN.  She had called earlier wanting to talk to a nurse regarding her vitamin D level, as she believed it was likely low and she noted she has lupus and was concerned.  She was subsequently scheduled for this follow-up virtual medicine visit.  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  And still everything hurts.  She notes her hands often hurt trying to do things, although difficult to say if a lot of joint swelling.  Even skin hurts.   No URI sx's, no cough, sore throats, increased PND/mucus, no SOB, no loss of taste or smell, no diarrhea or GI symptoms of concern.  She has no concerns for a Covid type illness.  She stopped methacarbamol, baclofen for neck muscle spasms, stopped in November. 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 Taking Tylenol to try to help. She has multiple allergies noted  including with NSAIDs.  She has also tried to stop taking other meds in the last months as trying to get off of the many medications was taking (see edited med list).   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  (Dr. Rutherford Williamson). Sometimes joints will ache, lips will swell, occasionally gets fevers with this and lymph nodes swell and then resolves.  Was not recommended to see rheumatology at that point, as the dermatologist noted this was a cutaneous syndrome and not likely a systemic rheumatologic process.  Her vitamin D level was noted to be low approximately 2 years ago, 22.9 was the level. Took supplement after. Not taken Vit D since July. Most recent labs in November. Lab Results  Component Value Date   TSH 1.880 08/23/2019   Lab Results  Component Value Date   HGBA1C 5.3 08/23/2019   Lab Results  Component Value Date   CHOL 174 08/23/2019   HDL 72 08/23/2019   LDLCALC 93 08/23/2019   TRIG 43 08/23/2019   CHOLHDL 2.4 08/23/2019    Last labs in July 2020 - had high WBC (18.2), not anemic, comp panel ok, HIV NR, SARS neg Current tob use  FH -uncle has rheumatoid arthritis   Patient Active Problem List   Diagnosis Date Noted  . Adenomyosis 05/30/2019  . Status post laparoscopic hysterectomy 05/15/2019  . 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 Cat bite  of forearm 06/17/2016  . Chronic obstructive pulmonary disease ([place]) 05/04/2016  . Contact dermatitis due to poison oak 04/03/2016  . Migraine without aura 04/03/2016  . NSAIDs adverse reaction 09/09/2015  . MDD (major depressive disorder), recurrent episode, moderate ([place]) 07/22/2015  . Gluten intolerance 06/19/2015  . Tobacco abuse   . Vitamin D deficiency disease   . HPV (human papilloma virus) infection   . High grade squamous intraepithelial cervical dysplasia   . GERD (gastroesophageal reflux disease)     Past Surgical History:  Procedure  Laterality Date  . CESAREAN SECTION     X2  . CHOLECYSTECTOMY     Dr Erika Williamson  . COLONOSCOPY WITH PROPOFOL N/A 05/03/2015   Procedure: COLONOSCOPY WITH PROPOFOL;  Surgeon: Erika Class, MD;  Location: [facility] ENDOSCOPY;  Service: Endoscopy;  Laterality: N/A;  . DILITATION & CURRETTAGE/HYSTROSCOPY WITH NOVASURE ABLATION N/A 08/24/2017   Procedure: DILATATION & CURETTAGE/HYSTEROSCOPY (NO ABLATION DONE);  Surgeon: Erika Maid, MD;  Location: [facility] ORS;  Service: Gynecology;  Laterality: N/A;  . DILITATION & CURRETTAGE/HYSTROSCOPY WITH THERMACHOICE ABLATION N/A 09/22/2017   Procedure: DILATATION & CURETTAGE/HYSTEROSCOPY WITH [employer] GENESYS HTA  [place];  Surgeon: Erika Maid, MD;  Location: [facility] ORS;  Service: Gynecology;  Laterality: N/A;  . ESOPHAGOGASTRODUODENOSCOPY N/A 05/03/2015   Procedure: ESOPHAGOGASTRODUODENOSCOPY (EGD);  Surgeon: Erika Class, MD;  [facility] END[facility]COPY;  Service: Endoscopy;  Laterality: N/A;  . LAPAROSCOPIC BILATERAL SALPINGO OOPHERECTOMY Right 05/15/2019   Procedure: LAPAROSCOPIC RIGHT SALPINGO OOPHORECTOMY;  Surgeon: Erika Maid, MD;  Location: [facility] ORS;  Service: Gynecology;  Laterality: Right;  . 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 TONSILLECTOMY    . TOTAL LAPAROSCOPIC HYSTERECTOMY WITH SALPINGECTOMY Bilateral 05/15/2019   Procedure: TOTAL LAPAROSCOPIC HYSTERECTOMY WITH BILATERAL SALPINGECTOMY;  Surgeon: Erika Maid, MD;  Location: [facility] ORS;  Service: Gynecology;  Laterality: Bilateral;  . TUBAL LIGATION    . WISDOM TOOTH EXTRACTION      Family History  Problem Relation Age of Onset  . Diabetes Mother   . Hypertension Mother   . COPD Mother   . Stroke Mother   . Osteoporosis Mother   . Depression Mother   . Cancer Father 75       colorectal, spread to lung  . Alcohol abuse Father   . Hypertension Father   . Depression Father   . Diabetes Sister   . COPD Sister   . Anxiety disorder Sister   . Depression Sister   . Fibromyalgia  Sister   . Asthma Sister   . ADD / ADHD Son   . ADD / ADHD Daughter     Social History   Socioeconomic History  . Marital status: Divorced    Spouse name: Not on file  . Number of children: 2  . Years of education: Not on file  . Highest education level: Not on file  Occupational History  . Not on file  Tobacco Use  . Smoking status: Current Every Day Smoker    Packs/day: 1.00    Years: 22.00    Pack years: 22.00    Types: Cigarettes    Start date: 09/03/1993  . Smokeless tobacco: Never Used  Substance and Sexual Activity  . Alcohol use: Yes    Alcohol/week: 0.0 standard drinks    Comment: rare  . Drug use: No  . Sexual activity: Not Currently    Partners: Male    Birth control/protection: None  Other Topics Concern  . Not on file  Social  History Narrative  . Not on file   Social Determinants of Health   Financial Resource Strain: Low Risk   . Difficulty of Paying Living Expenses: Not hard at all  Food Insecurity: No Food Insecurity  . Worried About Charity fundraiser in the Last Year: Never true  . Ran Out of Food in the Last Year: Never true  Transportation Needs: No Transportation Needs  . Lack of Transportation (Medical): No  . Lack of Transportation (Non-Medical): No  Physical Activity: Inactive  . Days of Exercise per Week: 0 days  . Minutes of Exercise per Session: 0 min  Stress: No Stress Concern Present  . Feeling of Stress : Not at all  Social Connections: Moderately Isolated  . Frequency of Communication with Friends and Family: Three times a week  . Frequency of Social Gatherings with Friends and Family: Once a week  . Attends Religious Services: Never  . Active Member of Clubs or Organizations: No  . Attends Archivist Meetings: Never  . Marital Status: Never married  Intimate Partner Violence: Not At Risk  . Fear of Current or Ex-Partner: No  . Emotionally Abused: No  . Physically Abused: No  . Sexually Abused: No     Current  Outpatient Medications:  .  acetaminophen (TYLENOL 8 HOUR) 650 MG CR tablet, Take 2 tablets (1,300 mg total) by mouth every 8 (eight) hours as needed for pain., Disp: 30 tablet, Rfl: 1 .  acetaminophen (TYLENOL) 500 MG tablet, Take 2 tablets (1,000 mg total) by mouth every 8 (eight) hours as needed., Disp: 30 tablet, Rfl: 0 .  rizatriptan (MAXALT) 10 MG tablet, Take 10 mg by mouth as needed for migraine. , Disp: , Rfl:  .  EPINEPHrine (EPIPEN 2-PAK) 0.3 mg/0.3 mL IJ SOAJ injection, Inject 0.3 mLs (0.3 mg total) into the muscle as needed for anaphylaxis. (Patient not taking: Reported on 12/14/2019), Disp: 1 each, Rfl: 3  Allergies  Allergen Reactions  . Gabapentin Anaphylaxis  . Ivp Dye [Iodinated Diagnostic Agents] Anaphylaxis  . Ketorolac Anaphylaxis  . Metrizamide Anaphylaxis  . Naprosyn [Naproxen] Anaphylaxis  . Nsaids Anaphylaxis  . Shellfish Allergy     Swollen face  . Tolmetin Anaphylaxis  . Tramadol Anaphylaxis  . Erythromycin Other (See Comments)    Flu like symptoms  . Famciclovir Other (See Comments)    unknown  . Levofloxacin Other (See Comments)    Muscle aches and contractions   . Seroquel [Quetiapine Fumarate] Hives  . Compazine [Prochlorperazine Edisylate] Rash and Other (See Comments)    Muscle aches  . Pristiq [Desvenlafaxine] Rash and Other (See Comments)    Muscle aches    With staff assistance, above reviewed with the patient today.   ROS: As per HPI, otherwise no specific complaints on a limited and focused system review   Objective  Virtual encounter, vitals not obtained.  There is no height or weight on file to calculate BMI.  Physical Exam  Patient appears in NAD Breathing Effort normal. No respiratory distress. Speaking in complete sentences Neurological: Pt is alert and oriented,  Speech is normal.  Psychiatric: Patient has a normal mood and affect, behavior is normal. Very appropriate with conversation, judgment and thought content normal.     PHQ2/9: Depression screen [facility] 2/9 12/14/2019 08/10/2019 02/20/2019 10/10/2018 06/15/2016  Decreased Interest 0 0 0 3 2  Down, Depressed, Hopeless 0 0 0 3 2  PHQ - 2 Score 0 0 0 6 4  Altered sleeping 0  0 0 3 3  Tired, decreased energy 1 0 0 3 2  Change in appetite 0 0 0 0 2  Feeling bad or failure about yourself  0 0 0 0 2  Trouble concentrating 0 0 0 0 1  Moving slowly or fidgety/restless 0 0 0 0 0  Suicidal thoughts 0 0 0 0 1  PHQ-9 Score 1 0 0 12 15  Difficult doing work/chores Not difficult at all Not difficult at all Not difficult at all Not difficult at all Very difficult  Some encounter information is confidential and restricted. Go to Review Flowsheets activity to see all data.  Some recent data might be hidden   PHQ-2/9 Result reviewed   Fall Risk: Fall Risk  12/14/2019 08/10/2019 02/20/2019 10/10/2018 06/15/2016  Falls in the past year? 0 0 0 0 No  Number falls in past yr: 0 0 0 0 -  Injury with Fall? 0 0 0 0 -  Follow up - Falls evaluation completed - - -     Assessment & Plan 1. Lupus erythematosus tumidus Reviewed prior notes from her dermatology visits at [facility], with this diagnosis made by biopsy in 2017.  This is a cutaneous form of lupus.  She has noted intermittent systemic symptoms felt to be related to this diagnosis which have included myalgias, fevers, and lessened energy levels.  These flares do not last long and usually resolve.  Presently, the symptoms have lasted for weeks (all but the fever). Discussed that during a pandemic, Covid has to be in the differential, although it certainly does not seem likely to be Covid.  She has no cough, loss of taste or smell, or other more concerning symptoms of Covid outside of the fever she had initially.  Agreed to not rush to testing presently, although if develops any other concerning symptoms concerning for Covid, I would recommend getting tested and she understood. Also offered to get a vitamin D level, as it very possibly  could be low again, but I am not comfortable explaining the symptoms to vitamin D deficiency.  Does not seem like an inflammatory or rheumatic type of presentation.  Of note, her last blood counts done in July did show elevated white blood counts. It is very limited not having her in the office to be able to examine as part of this evaluation.  Do feel some lab studies would be helpful.  Offered to order them and check presently, which would include a repeat CBC, a sed rate and C-reactive protein, some rheumatic screens, a vitamin D level amongst possibly others.  Also discussed the rheumatology referral, and do feel it is indicated, and she was in agreement with this.  Noted the rheumatology consultants would likely have further labs to check, and she would like to only get stop once if possible.  Did note if the rheumatology referral cannot happen fairly quickly, she should let us know and we can get some of these initial tests done and asked that she follow-up with an in office visit to help with clinical correlation of these initial screening test.  She was in agreement with this approach. Can continue to use Tylenol products as needed in the short-term. She has a couple entities for some muscle spasms, prescribed by her neurologist for more neck muscle spasms, and may be helpful if she has more muscle pains/spasms to utilize. With her multiple allergy history, hesitant to try other entities presently. - Ambulatory referral to Rheumatology  2. Fever, unspecified fever  cause As above - Ambulatory referral to Rheumatology  3. Myalgia As above - Ambulatory referral to Rheumatology  4. Arthralgia, unspecified joint As above - Ambulatory referral to Rheumatology  5. Vitamin D deficiency disease As above    I discussed the assessment and treatment plan with the patient. The patient was provided an opportunity to ask questions and all were answered. The patient agreed with the plan and  demonstrated an understanding of the instructions.  The patient was advised to call back or seek an in-person evaluation if the symptoms worsen or if the condition fails to improve as anticipated.  I provided 30+ minutes of non-face-to-face time during this encounter that included discussing at length patient's sx/history, pertinent pmhx, medications, treatment and follow up plan. This time also included the necessary documentation, orders, and chart review.

## 2020-03-28 ENCOUNTER — Encounter: Payer: Self-pay | Admitting: Internal Medicine

## 2020-03-28 ENCOUNTER — Other Ambulatory Visit: Payer: Self-pay

## 2020-03-28 ENCOUNTER — Ambulatory Visit (INDEPENDENT_AMBULATORY_CARE_PROVIDER_SITE_OTHER): Payer: BC Managed Care – PPO | Admitting: Internal Medicine

## 2020-03-28 VITALS — BP 120/80 | HR 96 | Temp 97.8°F | Resp 16 | Ht 63.0 in | Wt 148.4 lb

## 2020-03-28 DIAGNOSIS — M5481 Occipital neuralgia: Secondary | ICD-10-CM | POA: Insufficient documentation

## 2020-03-28 DIAGNOSIS — M26622 Arthralgia of left temporomandibular joint: Secondary | ICD-10-CM | POA: Diagnosis not present

## 2020-03-28 DIAGNOSIS — H60502 Unspecified acute noninfective otitis externa, left ear: Secondary | ICD-10-CM

## 2020-03-28 MED ORDER — NEOMYCIN-POLYMYXIN-HC 1 % OT SOLN: 3.0000 [drp] | Freq: Four times a day (QID) | OTIC | 1 refills | Status: DC

## 2020-03-28 MED ORDER — RIZATRIPTAN BENZOATE 10 MG PO TABS: 10.0000 mg | ORAL_TABLET | ORAL | 0 refills | Status: DC | PRN

## 2020-03-28 NOTE — Progress Notes (Signed)
Patient ID: Erika Williamson, female    DOB: [birth year], 44 y.o.   MRN: [id number]  PCP: Towanda Malkin, MD  Chief Complaint  Patient presents with  . Ear Pain    started 3 weeks ago Left ear, feels like cotton is stuck in ear, sounds are muffled, painful, drains into her neck     Subjective:   Chaley B Williamson is a 44 y.o. female, presents to clinic with CC of the following:  Chief Complaint  Patient presents with  . Ear Pain    started 3 weeks ago Left ear, feels like cotton is stuck in ear, sounds are muffled, painful, drains into her neck     HPI:  Patient is a 44 year old female who presents today with ear pain. My first visit with her was in February 2021 via a telemedicine visit. She did follow-up with rheumatology as recommended, with those notes reviewed, and her polyarthralgia symptoms noted to be improved on the most recent follow-up visit, and she noted today that they remain improved.  She also noted she is taking a vitamin D supplement once weekly as recommended for 3 months, with plans to then recheck that level after.  She notes that 3 to 4 weeks ago, she first noticed that her ear felt more clogged, and did have a Covid vaccine around that time, and had some swollen glands in her neck she felt related to that.  She had a peroxide kit she used, applying drops to the ear, and also some hot water at times, and notes that she can partially hear now out of the left ear, still feels a little clogged, but more painful now with her feeling that discomfort below the ear at times into the neck as well as in the ear canal.  She has had no fevers, no discharge from the ear, no sinus congestion or increased mucus, no return of the swollen glands in her neck, and just notes the discomfort is bothersome to her at night especially when she lays on that side. She denies any history of TMJ problems. Also requests a refill of her Maxalt, as she notes it is hard to get  in with her neurologist, and has dose to take as needed for occipital neuralgia, and uses them very rarely.  Tobacco-current every day smoker Patient Active Problem List   Diagnosis Date Noted  . Adenomyosis 05/30/2019  . Status post laparoscopic hysterectomy 05/15/2019  . 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 Cat bite of forearm 06/17/2016  . Chronic obstructive pulmonary disease ([place]) 05/04/2016  . Contact dermatitis due to poison oak 04/03/2016  . Migraine without aura 04/03/2016  . NSAIDs adverse reaction 09/09/2015  . MDD (major depressive disorder), recurrent episode, moderate ([place]) 07/22/2015  . Gluten intolerance 06/19/2015  . Tobacco abuse   . Vitamin D deficiency disease   . HPV (human papilloma virus) infection   . High grade squamous intraepithelial cervical dysplasia   . GERD (gastroesophageal reflux disease)       Current Outpatient Medications:  .  acetaminophen (TYLENOL 8 HOUR) 650 MG CR tablet, Take 2 tablets (1,300 mg total) by mouth every 8 (eight) hours as needed for pain., Disp: 30 tablet, Rfl: 1 .  acetaminophen (TYLENOL) 500 MG tablet, Take 2 tablets (1,000 mg total) by mouth every 8 (eight) hours as needed., Disp: 30 tablet, Rfl: 0 .  rizatriptan (MAXALT) 10 MG tablet, Take 10 mg by mouth as needed for migraine. , Disp: , Rfl:  .  Vitamin D, Ergocalciferol, (DRISDOL) 1.25 MG (50000 UNIT) CAPS capsule, Take 50,000 Units by mouth every 7 (seven) days., Disp: , Rfl:  .  EPINEPHrine (EPIPEN 2-PAK) 0.3 mg/0.3 mL IJ SOAJ injection, Inject 0.3 mLs (0.3 mg total) into the muscle as needed for anaphylaxis. (Patient not taking: Reported on 12/14/2019), Disp: 1 each, Rfl: 3   Allergies  Allergen Reactions  . Gabapentin Anaphylaxis  . Ivp Dye [Iodinated Diagnostic Agents] Anaphylaxis  . Ketorolac Anaphylaxis  . Metrizamide Anaphylaxis  . Naprosyn [Naproxen] Anaphylaxis  . Nsaids Anaphylaxis  . Shellfish Allergy     Swollen face  . Tolmetin Anaphylaxis  .  Tramadol Anaphylaxis  . Erythromycin Other (See Comments)    Flu like symptoms  . Famciclovir Other (See Comments)    unknown  . Levofloxacin Other (See Comments)    Muscle aches and contractions   . Seroquel [Quetiapine Fumarate] Hives  . Compazine [Prochlorperazine Edisylate] Rash and Other (See Comments)    Muscle aches  . Pristiq [Desvenlafaxine] Rash and Other (See Comments)    Muscle aches     Past Surgical History:  Procedure Laterality Date  . CESAREAN SECTION     X2  . CHOLECYSTECTOMY     Dr Burt Knack  . COLONOSCOPY WITH PROPOFOL N/A 05/03/2015   Procedure: COLONOSCOPY WITH PROPOFOL;  Surgeon: Josefine Class, MD;  Location: [facility] ENDOSCOPY;  Service: Endoscopy;  Laterality: N/A;  . DILITATION & CURRETTAGE/HYSTROSCOPY WITH NOVASURE ABLATION N/A 08/24/2017   Procedure: DILATATION & CURETTAGE/HYSTEROSCOPY (NO ABLATION DONE);  Surgeon: Rubie Maid, MD;  Location: [facility] ORS;  Service: Gynecology;  Laterality: N/A;  . DILITATION & CURRETTAGE/HYSTROSCOPY WITH THERMACHOICE ABLATION N/A 09/22/2017   Procedure: DILATATION & CURETTAGE/HYSTEROSCOPY WITH BOSTON SCIENTIFIC GENESYS HTA  [place];  Surgeon: Rubie Maid, MD;  Location: [facility] ORS;  Service: Gynecology;  Laterality: N/A;  . ESOPHAGOGASTRODUODENOSCOPY N/A 05/03/2015   Procedure: ESOPHAGOGASTRODUODENOSCOPY (EGD);  Surgeon: Josefine Class, MD;  Location: [facility] ENDOSCOPY;  Service: Endoscopy;  Laterality: N/A;  . LAPAROSCOPIC BILATERAL SALPINGO OOPHERECTOMY Right 05/15/2019   Procedure: LAPAROSCOPIC RIGHT SALPINGO OOPHORECTOMY;  Surgeon: Rubie Maid, MD;  Location: [facility] ORS;  Service: Gynecology;  Laterality: Right;  . 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 TONSILLECTOMY    . TOTAL LAPAROSCOPIC HYSTERECTOMY WITH SALPINGECTOMY Bilateral 05/15/2019   Procedure: TOTAL LAPAROSCOPIC HYSTERECTOMY WITH BILATERAL SALPINGECTOMY;  Surgeon: Rubie Maid, MD;  Location: [facility] ORS;  Service: Gynecology;  Laterality: Bilateral;  . TUBAL  LIGATION    . WISDOM TOOTH EXTRACTION       Family History  Problem Relation Age of Onset  . Diabetes Mother   . Hypertension Mother   . COPD Mother   . Stroke Mother   . Osteoporosis Mother   . Depression Mother   . Cancer Father 53       colorectal, spread to lung  . Alcohol abuse Father   . Hypertension Father   . Depression Father   . Diabetes Sister   . COPD Sister   . Anxiety disorder Sister   . Depression Sister   . Fibromyalgia Sister   . Asthma Sister   . ADD / ADHD Son   . ADD / ADHD Daughter      Social History   Tobacco Use  . Smoking status: Current Every Day Smoker    Packs/day: 1.00    Years: 22.00    Pack years: 22.00    Types: Cigarettes    Start date: 09/03/1993  . Smokeless tobacco: Never Used  Substance Use Topics  . Alcohol use: Yes    Alcohol/week: 0.0 standard drinks    Comment: rare    With staff assistance, above reviewed with the patient today.  ROS: As per HPI, otherwise no specific complaints on a limited and focused system review   No results found for this or any previous visit (from the past 72 hour(s)).   PHQ2/9: Depression screen [facility] 2/9 03/28/2020 12/14/2019 08/10/2019 02/20/2019 10/10/2018  Decreased Interest 0 0 0 0 3  Down, Depressed, Hopeless 0 0 0 0 3  PHQ - 2 Score 0 0 0 0 6  Altered sleeping 0 0 0 0 3  Tired, decreased energy 0 1 0 0 3  Change in appetite 0 0 0 0 0  Feeling bad or failure about yourself  0 0 0 0 0  Trouble concentrating 0 0 0 0 0  Moving slowly or fidgety/restless 0 0 0 0 0  Suicidal thoughts 0 0 0 0 0  PHQ-9 Score 0 1 0 0 12  Difficult doing work/chores Not difficult at all Not difficult at all Not difficult at all Not difficult at all Not difficult at all  Some encounter information is confidential and restricted. Go to Review Flowsheets activity to see all data.  Some recent data might be hidden   PHQ-2/9 Result is neg  Fall Risk: Fall Risk  03/28/2020 12/14/2019 08/10/2019 02/20/2019  10/10/2018  Falls in the past year? 0 0 0 0 0  Number falls in past yr: 0 0 0 0 0  Injury with Fall? 0 0 0 0 0  Follow up - - Falls evaluation completed - -      Objective:   Vitals:   03/28/20 0928  BP: 120/80  Pulse: 96  Resp: 16  Temp: 97.8 F (36.6 C)  TempSrc: Temporal  SpO2: 99%  Weight: 148 lb 6.4 oz (67.3 kg)  Height: '5\' 3"'  (1.6 m)    Body mass index is 26.29 kg/m.  Physical Exam   NAD, masked, looks well, not ill-appearing HEENT - [place]/AT, sclera anicteric, PERRL, conj - non-inj'ed, no focal sinus tenderness, right canal without marked erythema and TM clear, markedly tender tugging the left lobe, with the canal narrowed and inflamed, with erythema present, mild residual cerumen in the canal although no impaction, TM not bulging with adequate light reflex, pharynx clear, mild discomfort at the left TMJ, with slight discomfort opening and closing her jaw when palpating the TMJ and no marked disruption evident, no overlying skin changes or swelling Neck - supple, no adenopathy, mildly tender below the ear and the subauricular node region, although no increased nodes evident, no rigidity Neuro/psychiatric - affect was not flat, appropriate with conversation  Alert with speech normal  Results for orders placed or performed in visit on 08/23/19  Lipid panel  Result Value Ref Range   Cholesterol, Total 174 100 - 199 mg/dL   Triglycerides 43 0 - 149 mg/dL   HDL 72 >39 mg/dL   VLDL Cholesterol Cal 9 5 - 40 mg/dL   LDL Chol Calc (NIH) 93 0 - 99 mg/dL   Chol/HDL Ratio 2.4 0.0 - 4.4 ratio  TSH  Result Value Ref Range   TSH 1.880 0.450 - 4.500 uIU/mL  Hemoglobin A1c  Result Value Ref Range   Hgb A1c MFr Bld 5.3 4.8 - 5.6 %   Est. average glucose Bld gHb Est-mCnc 105 mg/dL       Assessment & Plan:    1. Occipital  neuralgia, unspecified laterality Okay to refill the Maxalt product which she noted she uses very sparingly, and continues to follow with neurology. -  rizatriptan (MAXALT) 10 MG tablet; Take 1 tablet (10 mg total) by mouth as needed for migraine.  Dispense: 10 tablet; Refill: 0  2. Acute otitis externa of left ear, unspecified type Discussed concerns with outer ear infection, and do feel the drops with the antibiotic and steroid component will be helpful.  She notes the ear is often itchy as well, and do feel the steroid component will be helpful with that. Not to put anything in her ear presently and she notes she does not use Q-tips routinely. Do not feel there is need for systemic antibiotics presently, although continue to closely monitor. Can continue the Tylenol products as needed in the short-term. - NEOMYCIN-POLYMYXIN-HYDROCORTISONE (CORTISPORIN) 1 % SOLN OTIC solution; Place 3 drops into the left ear 4 (four) times daily.  Dispense: 10 mL; Refill: 1  3. Arthralgia of left temporomandibular joint May have an element of some mild TMJ arthralgia, and recommended ice to that area when uncomfortable, also limited chewing in the short-term, and continue Tylenol products as needed.   We will follow-up if symptoms not improving or more problematic as discussed today.    Towanda Malkin, MD 03/28/20 9:39 AM

## 2020-07-09 ENCOUNTER — Other Ambulatory Visit: Payer: Self-pay

## 2020-07-09 ENCOUNTER — Encounter: Payer: Self-pay | Admitting: Internal Medicine

## 2020-07-09 ENCOUNTER — Telehealth (INDEPENDENT_AMBULATORY_CARE_PROVIDER_SITE_OTHER): Payer: BC Managed Care – PPO | Admitting: Internal Medicine

## 2020-07-09 DIAGNOSIS — J0101 Acute recurrent maxillary sinusitis: Secondary | ICD-10-CM

## 2020-07-09 MED ORDER — AMOXICILLIN-POT CLAVULANATE 875-125 MG PO TABS: 1.0000 | ORAL_TABLET | Freq: Two times a day (BID) | ORAL | 0 refills | Status: DC

## 2020-07-09 NOTE — Progress Notes (Signed)
Name: Erika Williamson   MRN: [id number]    DOB: [birth year]   Date:07/09/2020       Progress Note  Subjective  Chief Complaint  Chief Complaint  Patient presents with  . Sinus Problem    sinus pressure, has been ongoing for 2 weeks  . Ear Pain    ears clogged    I connected with  Maison B Vecchio on 07/09/20 at  2:20 PM EDT by telephone and verified that I am speaking with the correct person using two identifiers.  I discussed the limitations, risks, security and privacy concerns of performing an evaluation and management service by telephone and the availability of in person appointments. The patient expressed understanding and agreed to proceed. Staff also discussed with the patient that there may be a patient responsible charge related to this service. Patient Location: work initially, on second call had pulled off the road to talk. Provider Location: [facility] Additional Individuals present: none Call patient a little early at 1:40, and she noted she was still at work, and is not coughing out till 2 PM.  Asked that I return her call around the time of the scheduled appointment, which was 2:20, and did shortly thereafter  HPI Patient is a 44 year old female Follows up today by phone with the above complaints   Patient with sinus congestion, PND that started 2 weeks ago, last week was really bad.  + sinus pain, across her cheeks, often felt into the teeth, moved into her ears and ear fullness, hurts when presses on sinuses, notes very tender, notes feels swollen Clear mucus noted when able to get to drain Mild sore throat No fevers Ear fullness/pressure also noted Some cough, mainly in am  No N/V No loss of taste or smell, questions not smelling as well as previously with the sinus congestion tried OTC sinus meds and benadryl to help with sx's to date. Noted nasal steroid in past and made it worse, noted Neti pot also made worse  No asthma hx + tob hx - 1 PPD since age  25 + seasonal allergy history, more in the fall, worse with mask wearing She noted a long difficult sinus infection history, especially when she was younger  Allergies-has multiple allergies which were reviewed, no she has been on Augmentin in the past and done well with that.    Patient Active Problem List   Diagnosis Date Noted  . Occipital neuralgia 03/28/2020  . Adenomyosis 05/30/2019  . Status post laparoscopic hysterectomy 05/15/2019  . 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 Cat bite of forearm 06/17/2016  . Chronic obstructive pulmonary disease ([place]) 05/04/2016  . Contact dermatitis due to poison oak 04/03/2016  . Migraine without aura 04/03/2016  . NSAIDs adverse reaction 09/09/2015  . MDD (major depressive disorder), recurrent episode, moderate ([place]) 07/22/2015  . Gluten intolerance 06/19/2015  . Tobacco abuse   . Vitamin D deficiency disease   . HPV (human papilloma virus) infection   . High grade squamous intraepithelial cervical dysplasia   . GERD (gastroesophageal reflux disease)     Past Surgical History:  Procedure Laterality Date  . CESAREAN SECTION     X2  . CHOLECYSTECTOMY     Dr Burt Knack  . COLONOSCOPY WITH PROPOFOL N/A 05/03/2015   Procedure: COLONOSCOPY WITH PROPOFOL;  Surgeon: Josefine Class, MD;  Location: [facility] ENDOSCOPY;  Service: Endoscopy;  Laterality: N/A;  . DILITATION & CURRETTAGE/HYSTROSCOPY WITH NOVASURE ABLATION N/A 08/24/2017   Procedure: DILATATION & CURETTAGE/HYSTEROSCOPY (NO ABLATION  DONE);  Surgeon: Rubie Maid, MD;  Location: [facility] ORS;  Service: Gynecology;  Laterality: N/A;  . DILITATION & CURRETTAGE/HYSTROSCOPY WITH THERMACHOICE ABLATION N/A 09/22/2017   Procedure: DILATATION & CURETTAGE/HYSTEROSCOPY WITH BOSTON SCIENTIFIC GENESYS HTA  [place];  Surgeon: Rubie Maid, MD;  Location: [facility] ORS;  Service: Gynecology;  Laterality: N/A;  . ESOPHAGOGASTRODUODENOSCOPY N/A 05/03/2015   Procedure: ESOPHAGOGASTRODUODENOSCOPY (EGD);   Surgeon: Josefine Class, MD;  Location: [facility] ENDOSCOPY;  Service: Endoscopy;  Laterality: N/A;  . LAPAROSCOPIC BILATERAL SALPINGO OOPHERECTOMY Right 05/15/2019   Procedure: LAPAROSCOPIC RIGHT SALPINGO OOPHORECTOMY;  Surgeon: Rubie Maid, MD;  Location: [facility] ORS;  Service: Gynecology;  Laterality: Right;  . 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 TONSILLECTOMY    . TOTAL LAPAROSCOPIC HYSTERECTOMY WITH SALPINGECTOMY Bilateral 05/15/2019   Procedure: TOTAL LAPAROSCOPIC HYSTERECTOMY WITH BILATERAL SALPINGECTOMY;  Surgeon: Rubie Maid, MD;  Location: [facility] ORS;  Service: Gynecology;  Laterality: Bilateral;  . TUBAL LIGATION    . WISDOM TOOTH EXTRACTION      Family History  Problem Relation Age of Onset  . Diabetes Mother   . Hypertension Mother   . COPD Mother   . Stroke Mother   . Osteoporosis Mother   . Depression Mother   . Cancer Father 52       colorectal, spread to lung  . Alcohol abuse Father   . Hypertension Father   . Depression Father   . Diabetes Sister   . COPD Sister   . Anxiety disorder Sister   . Depression Sister   . Fibromyalgia Sister   . Asthma Sister   . ADD / ADHD Son   . ADD / ADHD Daughter     Social History   Tobacco Use  . Smoking status: Current Every Day Smoker    Packs/day: 1.00    Years: 22.00    Pack years: 22.00    Types: Cigarettes    Start date: 09/03/1993  . Smokeless tobacco: Never Used  Substance Use Topics  . Alcohol use: Yes    Alcohol/week: 0.0 standard drinks    Comment: rare     Current Outpatient Medications:  .  acetaminophen (TYLENOL 8 HOUR) 650 MG CR tablet, Take 2 tablets (1,300 mg total) by mouth every 8 (eight) hours as needed for pain., Disp: 30 tablet, Rfl: 1 .  rizatriptan (MAXALT) 10 MG tablet, Take 1 tablet (10 mg total) by mouth as needed for migraine., Disp: 10 tablet, Rfl: 0 .  EPINEPHrine (EPIPEN 2-PAK) 0.3 mg/0.3 mL IJ SOAJ injection, Inject 0.3 mLs (0.3 mg total) into the muscle as needed for anaphylaxis.  (Patient not taking: Reported on 12/14/2019), Disp: 1 each, Rfl: 3 .  NEOMYCIN-POLYMYXIN-HYDROCORTISONE (CORTISPORIN) 1 % SOLN OTIC solution, Place 3 drops into the left ear 4 (four) times daily. (Patient not taking: Reported on 07/09/2020), Disp: 10 mL, Rfl: 1  Allergies  Allergen Reactions  . Gabapentin Anaphylaxis  . Ivp Dye [Iodinated Diagnostic Agents] Anaphylaxis  . Ketorolac Anaphylaxis  . Metrizamide Anaphylaxis  . Naprosyn [Naproxen] Anaphylaxis  . Nsaids Anaphylaxis  . Shellfish Allergy     Swollen face  . Tolmetin Anaphylaxis  . Tramadol Anaphylaxis  . Erythromycin Other (See Comments)    Flu like symptoms  . Famciclovir Other (See Comments)    unknown  . Levofloxacin Other (See Comments)    Muscle aches and contractions   . Seroquel [Quetiapine Fumarate] Hives  . Compazine [Prochlorperazine Edisylate] Rash and Other (See Comments)    Muscle aches  .  Pristiq [Desvenlafaxine] Rash and Other (See Comments)    Muscle aches    With staff assistance, above reviewed with the patient today.  ROS: As per HPI, otherwise no specific complaints on a limited and focused system review   Objective  Virtual encounter, vitals not obtained.  There is no height or weight on file to calculate BMI.  Physical Exam   Appears in NAD via conversation Breathing: No obvious respiratory distress. Speaking in complete sentences Neurological: Pt is alert, Speech is normal Psychiatric: Patient has a normal mood and affect, behavior is normal. Judgment and thought content normal.   No results found for this or any previous visit (from the past 72 hour(s)).  PHQ2/9: Depression screen [facility] 2/9 07/09/2020 03/28/2020 12/14/2019 08/10/2019 02/20/2019  Decreased Interest 0 0 0 0 0  Down, Depressed, Hopeless 0 0 0 0 0  PHQ - 2 Score 0 0 0 0 0  Altered sleeping - 0 0 0 0  Tired, decreased energy - 0 1 0 0  Change in appetite - 0 0 0 0  Feeling bad or failure about yourself  - 0 0 0 0  Trouble  concentrating - 0 0 0 0  Moving slowly or fidgety/restless - 0 0 0 0  Suicidal thoughts - 0 0 0 0  PHQ-9 Score - 0 1 0 0  Difficult doing work/chores - Not difficult at all Not difficult at all Not difficult at all Not difficult at all  Some encounter information is confidential and restricted. Go to Review Flowsheets activity to see all data.  Some recent data might be hidden   PHQ-2/9 Result reviewed  Fall Risk: Fall Risk  07/09/2020 03/28/2020 12/14/2019 08/10/2019 02/20/2019  Falls in the past year? 0 0 0 0 0  Number falls in past yr: 0 0 0 0 0  Injury with Fall? 0 0 0 0 0  Follow up Falls evaluation completed - - Falls evaluation completed -     Assessment & Plan  1. Acute recurrent maxillary sinusitis Patient was very concerned this was becoming a sinus infection again, as she has been battling this now for 2 weeks, with increasing symptoms as noted.  Feels a pressure/pain that is more problematic recently. She has tried nasal steroids in the past, and notes they have worsened things, Flonase in particular when asked which one. She also has tried a Nettie pot in the past which is never been helpful. Felt reasonable to add in Augmentin product-take twice daily She has been using over-the-counter sinus medications, and a Benadryl product which can be helpful at times.  Uses 1 or the other, as when she takes them together it makes her very drowsy.  I did warn her that Benadryl alone can make her drowsy as well. Also recommended can try some cool compresses to the sinuses when uncomfortable I do not feel there is a strong likelihood this is Covid, and she did have the vaccine.  She will follow-up if not improving or more problematic. - amoxicillin-clavulanate (AUGMENTIN) 875-125 MG tablet; Take 1 tablet by mouth 2 (two) times daily.  Dispense: 20 tablet; Refill: 0   I discussed the assessment and treatment plan with the patient. The patient was provided an opportunity to ask questions  and all were answered. The patient agreed with the plan and demonstrated an understanding of the instructions.   The patient was advised to call back or seek an in-person evaluation if the symptoms worsen or if the condition fails to  improve as anticipated.  I provided 20 minutes of non-face-to-face time during this encounter that included discussing at length patient's sx/history, pertinent pmhx, medications, treatment and follow up plan. This time also included the necessary documentation, orders, and chart review.  Towanda Malkin, MD

## 2021-06-16 ENCOUNTER — Other Ambulatory Visit: Payer: Self-pay | Admitting: Internal Medicine

## 2021-06-16 DIAGNOSIS — Z1231 Encounter for screening mammogram for malignant neoplasm of breast: Secondary | ICD-10-CM

## 2021-06-17 ENCOUNTER — Telehealth: Payer: BC Managed Care – PPO | Admitting: Obstetrics and Gynecology

## 2021-06-17 NOTE — Telephone Encounter (Signed)
Pt called stating that she had found a lump on her breast and she had already scheduled the soonest appointment (9/16), she called [facility] to try and get scan in meantime- she is requesting Dr.Cherry put in order for a diagnostic US of breast. Please Advise.

## 2021-06-18 NOTE — Telephone Encounter (Signed)
I need  to know more information, as it has been ~ 2 years since I've last seen her..is she taking any new medication (herbal, over the counter, or prescription): Which breast is it in? When was her last mammogram? Is she having any other symptoms (breast tenderness, nipple discharge, how long the mass has been present, has it gotten bigger over time or remained the same size since she noticed it?

## 2021-06-18 NOTE — Telephone Encounter (Signed)
Please advise. Thanks Rahkim Rabalais 

## 2021-06-20 NOTE — Telephone Encounter (Signed)
Patient called no answer LM informing pt that [facility] needed more information concerning her breast issues. Pt was informed via vm that she hasnt been seen in the office in 2 years and [facility] would need to exam her to give the proper order for the mammogram.

## 2021-06-25 NOTE — Telephone Encounter (Signed)
Patient called no answer. LM with information given by [facility] that she needed more information on her symptoms since she had not seen the pt in over 2 years. Pt was advised to keep her appointment with [facility]

## 2021-07-04 ENCOUNTER — Ambulatory Visit (INDEPENDENT_AMBULATORY_CARE_PROVIDER_SITE_OTHER): Payer: BC Managed Care – PPO | Admitting: Obstetrics and Gynecology

## 2021-07-04 ENCOUNTER — Other Ambulatory Visit: Payer: Self-pay

## 2021-07-04 ENCOUNTER — Encounter: Payer: Self-pay | Admitting: Obstetrics and Gynecology

## 2021-07-04 VITALS — BP 136/75 | HR 90 | Ht 63.0 in | Wt 141.4 lb

## 2021-07-04 DIAGNOSIS — Z1231 Encounter for screening mammogram for malignant neoplasm of breast: Secondary | ICD-10-CM | POA: Diagnosis not present

## 2021-07-04 DIAGNOSIS — N63 Unspecified lump in unspecified breast: Secondary | ICD-10-CM | POA: Diagnosis not present

## 2021-07-04 NOTE — Patient Instructions (Signed)
Breast Self-Awareness Breast self-awareness means being familiar with how your breasts look and feel. It involves checking your breasts regularly and reporting any changes to your health care provider. Practicing breast self-awareness is important. Sometimes changes may not be harmful (are benign), but sometimes a change in your breasts can be a sign of a serious medical problem. It is important to learn how to do this procedure correctly so that you can catch problems early, when treatment is more likely to be successful. All women should practice breast self-awareness, including women who have had breast implants. What you need: A mirror. A well-lit room. How to do a breast self-exam A breast self-exam is one way to learn what is normal for your breasts and whether your breasts are changing. To do a breast self-exam: Look for changes  Remove all the clothing above your waist. Stand in front of a mirror in a room with good lighting. Put your hands on your hips. Push your hands firmly downward. Compare your breasts in the mirror. Look for differences between them (asymmetry), such as: Differences in shape. Differences in size. Puckers, dips, and bumps in one breast and not the other. Look at each breast for changes in the skin, such as: Redness. Scaly areas. Look for changes in your nipples, such as: Discharge. Bleeding. Dimpling. Redness. A change in position. Feel for changes Carefully feel your breasts for lumps and changes. It is best to do this while lying on your back on the floor, and again while sitting or standing in the tub or shower with soapy water on your skin. Feel each breast in the following way: Place the arm on the side of the breast you are examining above your head. Feel your breast with the other hand. Start in the nipple area and make -inch (2 cm) overlapping circles to feel your breast. Use the pads of your three middle fingers to do this. Apply light pressure,  then medium pressure, then firm pressure. The light pressure will allow you to feel the tissue closest to the skin. The medium pressure will allow you to feel the tissue that is a little deeper. The firm pressure will allow you to feel the tissue close to the ribs. Continue the overlapping circles, moving downward over the breast until you feel your ribs below your breast. Move one finger-width toward the center of the body. Continue to use the -inch (2 cm) overlapping circles to feel your breast as you move slowly up toward your collarbone. Continue the up-and-down exam using all three pressures until you reach your armpit.  Write down what you find Writing down what you find can help you remember what to discuss with your health care provider. Write down: What is normal for each breast. Any changes that you find in each breast, including: The kind of changes you find. Any pain or tenderness. Size and location of any lumps. Where you are in your menstrual cycle, if you are still menstruating. General tips and recommendations Examine your breasts every month. If you are breastfeeding, the best time to examine your breasts is after a feeding or after using a breast pump. If you menstruate, the best time to examine your breasts is 5-7 days after your period. Breasts are generally lumpier during menstrual periods, and it may be more difficult to notice changes. With time and practice, you will become more familiar with the variations in your breasts and more comfortable with the exam. Contact a health care provider if you:  See a change in the shape or size of your breasts or nipples. See a change in the skin of your breast or nipples, such as a reddened or scaly area. Have unusual discharge from your nipples. Find a lump or thick area that was not there before. Have pain in your breasts. Have any concerns related to your breast health. Summary Breast self-awareness includes looking for  physical changes in your breasts, as well as feeling for any changes within your breasts. Breast self-awareness should be performed in front of a mirror in a well-lit room. You should examine your breasts every month. If you menstruate, the best time to examine your breasts is 5-7 days after your menstrual period. Let your health care provider know of any changes you notice in your breasts, including changes in size, changes on the skin, pain or tenderness, or unusual fluid from your nipples. This information is not intended to replace advice given to you by your health care provider. Make sure you discuss any questions you have with your health care provider. Document Revised: 05/24/2018 Document Reviewed: 05/24/2018 Elsevier Patient Education  [place].

## 2021-07-04 NOTE — Progress Notes (Signed)
    GYNECOLOGY CLINIC PROGRESS NOTE  Subjective:     Erika Williamson is an 45 y.o. G61P2002 female who presents for evaluation of a right breast mass. Change was noted 3 weeks ago, since first identified. Patient does routinely do self breast exams. She reports that the mass became enlarged and inflamed ~ 2 weeks ago.  The inflammation has subsided and the mass has gotten smaller, but she can still palpate it sometimes. The mass  is no longer tender. Patient denies nipple discharge. Breast cancer risk factors include: None. She has a previous history of hysterectomy.  She has never had a mammogram.   Of note, she does report that she was diagnosed with rheumatoid arthritis over the past year.    The following portions of the patient's history were reviewed and updated as appropriate: allergies, current medications, past family history, past medical history, past social history, past surgical history, and problem list.  Review of Systems Pertinent items are noted in HPI.     Objective:    Blood pressure 136/75, pulse 90, height '5\' 3"'$  (1.6 m), weight 141 lb 6.4 oz (64.1 kg), last menstrual period 04/27/2019. Body mass index is 25.05 kg/m.   General appearance: alert and no distress Breasts: No nipple retraction or dimpling, No nipple discharge or bleeding, No axillary or supraclavicular adenopathy, negative findings: normal in size and symmetry, normal contour with no evidence of flattening or dimpling, positive findings: 0.5 cm, smooth, round, mobile, and non-tender nodule located on the right upper inner quadrant    Assessment:   Breast mass, likely benign    Plan:   Reassured the patient that the finding is most likely benign. Discussed need for futher evaluation. Arranged for mammogram. Routine preventative health maintenance emphasized. She is overdue for annual exam, encouraged to schedule.    Rubie Maid, MD [facility]

## 2021-07-10 ENCOUNTER — Other Ambulatory Visit: Payer: Self-pay | Admitting: Obstetrics and Gynecology

## 2021-07-10 ENCOUNTER — Telehealth: Payer: Self-pay | Admitting: Obstetrics and Gynecology

## 2021-07-10 DIAGNOSIS — Z1231 Encounter for screening mammogram for malignant neoplasm of breast: Secondary | ICD-10-CM

## 2021-07-10 DIAGNOSIS — N63 Unspecified lump in unspecified breast: Secondary | ICD-10-CM

## 2021-07-10 NOTE — Telephone Encounter (Signed)
Erika Williamson called in and stated she tried to schedule her mammogram and they told her the order was not in correctly and stated it had to be changed to a diagnostic order.  Patient is very frustrated and would like a call back when order has been changed.  Please advise.

## 2021-07-11 ENCOUNTER — Other Ambulatory Visit: Payer: Self-pay

## 2021-07-11 DIAGNOSIS — Z1231 Encounter for screening mammogram for malignant neoplasm of breast: Secondary | ICD-10-CM

## 2021-07-11 DIAGNOSIS — N63 Unspecified lump in unspecified breast: Secondary | ICD-10-CM

## 2021-07-11 NOTE — Telephone Encounter (Signed)
Send in new order. Will you please check it to make sure it is correct? Thanks [employer]

## 2021-07-14 ENCOUNTER — Other Ambulatory Visit: Payer: Self-pay | Admitting: Obstetrics and Gynecology

## 2021-07-14 DIAGNOSIS — N63 Unspecified lump in unspecified breast: Secondary | ICD-10-CM

## 2021-07-14 DIAGNOSIS — Z1231 Encounter for screening mammogram for malignant neoplasm of breast: Secondary | ICD-10-CM

## 2021-07-14 NOTE — Telephone Encounter (Signed)
Yes, that is the correct one. She should be able to schedule now. I also included the ultrasound in case they need it.

## 2021-07-14 NOTE — Telephone Encounter (Signed)
Pt called no answer LM via VM that her mammogram order was placed on Friday and was sent to [facility] to insure that the order was placed correctly this time. AC stated that it was the correct order.

## 2021-07-15 NOTE — Telephone Encounter (Signed)
Pt scheduled her mammogram appointment.

## 2021-07-23 ENCOUNTER — Ambulatory Visit
Admission: RE | Admit: 2021-07-23 | Discharge: 2021-07-23 | Disposition: A | Discharge status: Home / Self Care | Payer: BC Managed Care – PPO | Source: Ambulatory Visit | Attending: Obstetrics and Gynecology | Admitting: Obstetrics and Gynecology

## 2021-07-23 ENCOUNTER — Other Ambulatory Visit: Payer: Self-pay

## 2021-07-23 DIAGNOSIS — N63 Unspecified lump in unspecified breast: Secondary | ICD-10-CM

## 2021-07-23 DIAGNOSIS — R922 Inconclusive mammogram: Secondary | ICD-10-CM | POA: Diagnosis not present

## 2021-07-23 DIAGNOSIS — Z1231 Encounter for screening mammogram for malignant neoplasm of breast: Secondary | ICD-10-CM

## 2021-07-23 DIAGNOSIS — N644 Mastodynia: Secondary | ICD-10-CM | POA: Diagnosis not present

## 2021-07-23 DIAGNOSIS — N6310 Unspecified lump in the right breast, unspecified quadrant: Secondary | ICD-10-CM | POA: Diagnosis not present

## 2021-08-26 DIAGNOSIS — L930 Discoid lupus erythematosus: Secondary | ICD-10-CM | POA: Diagnosis not present

## 2021-08-26 DIAGNOSIS — Z111 Encounter for screening for respiratory tuberculosis: Secondary | ICD-10-CM | POA: Diagnosis not present

## 2021-08-26 DIAGNOSIS — Z7960 Long term (current) use of unspecified immunomodulators and immunosuppressants: Secondary | ICD-10-CM | POA: Diagnosis not present

## 2021-08-26 DIAGNOSIS — M0579 Rheumatoid arthritis with rheumatoid factor of multiple sites without organ or systems involvement: Secondary | ICD-10-CM | POA: Diagnosis not present

## 2021-10-29 ENCOUNTER — Encounter: Payer: BC Managed Care – PPO | Admitting: Obstetrics and Gynecology

## 2021-10-30 ENCOUNTER — Encounter: Payer: Self-pay | Admitting: Obstetrics and Gynecology

## 2021-10-30 ENCOUNTER — Ambulatory Visit (INDEPENDENT_AMBULATORY_CARE_PROVIDER_SITE_OTHER): Payer: BC Managed Care – PPO | Admitting: Obstetrics and Gynecology

## 2021-10-30 ENCOUNTER — Other Ambulatory Visit: Payer: Self-pay

## 2021-10-30 VITALS — BP 112/78 | HR 64 | Ht 63.5 in | Wt 154.0 lb

## 2021-10-30 DIAGNOSIS — E663 Overweight: Secondary | ICD-10-CM | POA: Diagnosis not present

## 2021-10-30 DIAGNOSIS — Z01419 Encounter for gynecological examination (general) (routine) without abnormal findings: Secondary | ICD-10-CM | POA: Diagnosis not present

## 2021-10-30 DIAGNOSIS — Z90710 Acquired absence of both cervix and uterus: Secondary | ICD-10-CM | POA: Diagnosis not present

## 2021-10-30 DIAGNOSIS — N9089 Other specified noninflammatory disorders of vulva and perineum: Secondary | ICD-10-CM

## 2021-10-30 DIAGNOSIS — Z1231 Encounter for screening mammogram for malignant neoplasm of breast: Secondary | ICD-10-CM

## 2021-10-30 NOTE — Progress Notes (Signed)
GYNECOLOGY ANNUAL PHYSICAL EXAM PROGRESS NOTE  Subjective:    Erika Williamson is a 46 y.o. G34P2000 female who presents for an annual exam. The patient is not currently sexually active (husband is away serving in the [employer]).  Overall doing well. The patient wears seatbelts: yes. The patient participates in regular exercise: no. Has the patient ever been transfused or tattooed?: yes. The patient reports that there is not domestic violence in her life.   Notes that she was started on Humara ~ 3 months ago.  Notes that she feels drained when she gets the injections.   Gynecologic History  Menarche age: 20 Patient's last menstrual period was 04/27/2019 (approximate). Contraception: s/p hysterectomy History of STI's: Denies Last Pap: 08/03/2017. Results were: normal.  Reports h/o abnormal pap smears. Last mammogram: 07/23/2021. Benign cyst at 12 o'clock of right breast noted on ultrasound.     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   Anxiety    Atypical face pain    Left Sided   Complication of anesthesia    epidural for c secction-n/v and spinal for 2nd section bp dropped   Depression    Family history of adverse reaction to anesthesia    dad allergic to novacaine   Foot fracture Left X2   GERD (gastroesophageal reflux disease)    History of hiatal hernia    small    HPV (human papilloma virus) infection    Lupus (HCC)    Migraine without aura 04/03/2016   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   Occipital neuralgia of right side    Rheumatoid  arteritis (HCC)    Shingles    Sinusitis    Tension headache    Tobacco abuse    Tonsillitis    Vitamin D deficiency disease     Past Surgical History:  Procedure Laterality Date   birth control removed  06/09/2017   CESAREAN SECTION     X2   CHOLECYSTECTOMY     Dr Burt Knack   COLONOSCOPY WITH PROPOFOL N/A 05/03/2015   Procedure: COLONOSCOPY WITH PROPOFOL;  Surgeon: Josefine Class, MD;  Location: [facility] ENDOSCOPY;  Service: Endoscopy;  Laterality: N/A;   DILITATION & CURRETTAGE/HYSTROSCOPY WITH NOVASURE ABLATION N/A 08/24/2017   Procedure: DILATATION & CURETTAGE/HYSTEROSCOPY (NO ABLATION DONE);  Surgeon: Rubie Maid, MD;  Location: [facility] ORS;  Service: Gynecology;  Laterality: N/A;   DILITATION & CURRETTAGE/HYSTROSCOPY WITH THERMACHOICE ABLATION N/A 09/22/2017   Procedure: DILATATION & CURETTAGE/HYSTEROSCOPY WITH [employer]ES[employer] HTA  [place];  Surgeon: Rubie Maid, MD;  Location: [facility] ORS;  Service: Gynecology;  Laterality: N/A;   ESOPHAGOGASTRODUODENOSCOPY N/A 05/03/2015   Procedure: ESOPHAGOGASTRODUODENOSCOPY (EGD);  Surgeon: Josefine Class, MD;  Location: [facility] ENDOSCOPY;  Service: Endoscopy;  Laterality: N/A;   LAPAROSCOPIC BILATERAL SALPINGO OOPHERECTOMY Right 05/15/2019   LEEP  10/05/2013   h/o CIN II   TONSILLECTOMY  TOTAL LAPAROSCOPIC HYSTERECTOMY WITH SALPINGECTOMY AND RIGHT OOPHORECTOMY Bilateral 05/15/2019   Procedure: TOTAL LAPAROSCOPIC HYSTERECTOMY WITH BILATERAL SALPINGECTOMY;  Surgeon: Rubie Maid, MD;  Location: [facility] ORS;  Service: Gynecology;  Laterality: Bilateral;   TUBAL LIGATION     WISDOM TOOTH EXTRACTION      Family History  Problem Relation Age of Onset   Diabetes Mother    Hypertension Mother    COPD Mother    Stroke Mother    Osteoporosis Mother    Depression Mother    Cancer Father 60       colorectal, spread to lung   Alcohol abuse Father    Hypertension Father    Depression Father    Diabetes Sister    COPD Sister     Anxiety disorder Sister    Depression Sister    Fibromyalgia Sister    Asthma Sister    ADD / ADHD Son    ADD / ADHD Daughter     Social History   Socioeconomic History   Marital status: Divorced    Spouse name: Not on file   Number of children: 2   Years of education: Not on file   Highest education level: Not on file  Occupational History   Not on file  Tobacco Use   Smoking status: Former    Packs/day: 1.00    Years: 22.00    Pack years: 22.00    Types: Cigarettes    Start date: 09/03/1993   Smokeless tobacco: Never  Vaping Use   Vaping Use: Every day  Substance and Sexual Activity   Alcohol use: Yes    Alcohol/week: 0.0 standard drinks    Comment: rare   Drug use: No   Sexual activity: Yes    Partners: Male    Birth control/protection: Surgical  Other Topics Concern   Not on file  Social History Narrative   Not on file   Social Determinants of Health   Financial Resource Strain: Not on file  Food Insecurity: Not on file  Transportation Needs: Not on file  Physical Activity: Not on file  Stress: Not on file  Social Connections: Not on file  Intimate Partner Violence: Not on file    Current Outpatient Medications on File Prior to Visit  Medication Sig Dispense Refill   acetaminophen (TYLENOL 8 HOUR) 650 MG CR tablet Take 2 tablets (1,300 mg total) by mouth every 8 (eight) hours as needed for pain. 30 tablet 1   clobetasol ointment (TEMOVATE) 2.40 % Apply 1 application topically 2 (two) times daily.     EPINEPHrine (EPIPEN 2-PAK) 0.3 mg/0.3 mL IJ SOAJ injection Inject 0.3 mLs (0.3 mg total) into the muscle as needed for anaphylaxis. 1 each 3   leflunomide (ARAVA) 10 MG tablet Take 10 mg by mouth daily.     triamcinolone ointment (KENALOG) 0.5 % Apply 1 application topically 2 (two) times daily.     No current facility-administered medications on file prior to visit.    Allergies  Allergen Reactions   Gabapentin Anaphylaxis   Ivp Dye [Iodinated  Contrast Media] Anaphylaxis   Ketorolac Anaphylaxis   Methotrexate Derivatives Anaphylaxis   Metrizamide Anaphylaxis   Naprosyn [Naproxen] Anaphylaxis   Nsaids Anaphylaxis   Shellfish Allergy Other (See Comments)    Swollen face Swollen face   Tolmetin Anaphylaxis   Tramadol Anaphylaxis   Erythromycin Other (See Comments)    Flu like symptoms   Famciclovir Other (See Comments)    unknown   Levofloxacin Other (See  Comments)    Muscle aches and contractions    Seroquel [Quetiapine Fumarate] Hives   Compazine [Prochlorperazine Edisylate] Rash and Other (See Comments)    Muscle aches   Pristiq [Desvenlafaxine] Rash and Other (See Comments)    Muscle aches     Review of Systems Constitutional: negative for chills, fatigue, fevers and sweats Eyes: negative for irritation, redness and visual disturbance Ears, nose, mouth, throat, and face: negative for hearing loss, nasal congestion, snoring and tinnitus Respiratory: negative for asthma, cough, sputum Cardiovascular: negative for chest pain, dyspnea, exertional chest pressure/discomfort, irregular heart beat, palpitations and syncope Gastrointestinal: negative for abdominal pain, change in bowel habits, nausea and vomiting Genitourinary: negative for abnormal menstrual periods, genital lesions, sexual problems and vaginal discharge, dysuria and urinary incontinence Integument/breast: negative for breast lump, breast tenderness and nipple discharge Hematologic/lymphatic: negative for bleeding and easy bruising Musculoskeletal:negative for back pain and muscle weakness Neurological: negative for dizziness, headaches, vertigo and weakness Endocrine: negative for diabetic symptoms including polydipsia, polyuria and skin dryness Allergic/Immunologic: negative for hay fever and urticaria      Objective:  Blood pressure 112/78, pulse 64, height 5' 3.5" (1.613 m), weight 154 lb (69.9 kg), last menstrual period 04/27/2019, SpO2 98 %. Body  mass index is 26.85 kg/m.  General Appearance:    Alert, cooperative, no distress, appears stated age, overweight  Head:    Normocephalic, without obvious abnormality, atraumatic  Eyes:    PERRL, conjunctiva/corneas clear, EOM's intact, both eyes  Ears:    Normal external ear canals, both ears  Nose:   Nares normal, septum midline, mucosa normal, no drainage or sinus tenderness  Throat:   Lips, mucosa, and tongue normal; teeth and gums normal  Neck:   Supple, symmetrical, trachea midline, no adenopathy; thyroid: no enlargement/tenderness/nodules; no carotid bruit or JVD  Back:     Symmetric, no curvature, ROM normal, no CVA tenderness  Lungs:     Clear to auscultation bilaterally, respirations unlabored  Chest Wall:    No tenderness or deformity   Heart:    Regular rate and rhythm, S1 and S2 normal, no murmur, rub or gallop  Breast Exam:    No tenderness, masses, or nipple abnormality  Abdomen:     Soft, non-tender, bowel sounds active all four quadrants, no masses, no organomegaly.    Genitalia:    Pelvic:external genitalia normal (with exception of small skin tags at base), vagina without lesions, discharge, or tenderness, rectovaginal septum  normal.  Vaginal cuff well-healed. Uterus and cervix surgically absent. Adnexae non-tender, not enlarged bilaterally.   Rectal:    Normal external sphincter.  No hemorrhoids appreciated. Internal exam not done.   Extremities:   Extremities normal, atraumatic, no cyanosis or edema  Pulses:   2+ and symmetric all extremities  Skin:   Skin color, texture, turgor normal, no rashes or lesions  Lymph nodes:   Cervical, supraclavicular, and axillary nodes normal  Neurologic:   CNII-XII intact, normal strength, sensation and reflexes throughout   .  Labs:  Labs reviewed in Care Everywhere  Assessment:   1. Well woman exam with routine gynecological exam   2. History of hysterectomy   3. Overweight (BMI 25.0-29.9)   4. Vulvar skin tag      Plan:      Labs: Reviewed in Care Everywhere  reast self exam technique reviewed and patient encouraged to perform self-exam monthly. Contraception: status post hysterectomy. Discussed healthy lifestyle modifications. Mammogram: up to date.  Pap smear up to date.  No further pap smears needed as patient is s/p hysterectomy.  Declines COVID vaccine.  Declines flu vaccine.  Patient desires to return in 2 months for excision of skin tag.  Follow up in 1 year.    Rubie Maid, MD [facility]

## 2021-11-26 DIAGNOSIS — L930 Discoid lupus erythematosus: Secondary | ICD-10-CM | POA: Diagnosis not present

## 2021-11-26 DIAGNOSIS — M0579 Rheumatoid arthritis with rheumatoid factor of multiple sites without organ or systems involvement: Secondary | ICD-10-CM | POA: Diagnosis not present

## 2021-11-26 DIAGNOSIS — Z7960 Long term (current) use of unspecified immunomodulators and immunosuppressants: Secondary | ICD-10-CM | POA: Diagnosis not present

## 2022-01-05 DIAGNOSIS — H6092 Unspecified otitis externa, left ear: Secondary | ICD-10-CM | POA: Diagnosis not present

## 2022-01-05 DIAGNOSIS — H6122 Impacted cerumen, left ear: Secondary | ICD-10-CM | POA: Diagnosis not present

## 2022-01-14 ENCOUNTER — Encounter: Payer: BC Managed Care – PPO | Admitting: Obstetrics and Gynecology

## 2022-02-25 DIAGNOSIS — Z7960 Long term (current) use of unspecified immunomodulators and immunosuppressants: Secondary | ICD-10-CM | POA: Diagnosis not present

## 2022-02-25 DIAGNOSIS — M0579 Rheumatoid arthritis with rheumatoid factor of multiple sites without organ or systems involvement: Secondary | ICD-10-CM | POA: Diagnosis not present

## 2022-02-25 DIAGNOSIS — M7061 Trochanteric bursitis, right hip: Secondary | ICD-10-CM | POA: Diagnosis not present

## 2022-02-25 DIAGNOSIS — L930 Discoid lupus erythematosus: Secondary | ICD-10-CM | POA: Diagnosis not present

## 2022-03-19 DIAGNOSIS — H53149 Visual discomfort, unspecified: Secondary | ICD-10-CM | POA: Diagnosis not present

## 2022-03-19 DIAGNOSIS — R519 Headache, unspecified: Secondary | ICD-10-CM | POA: Diagnosis not present

## 2022-03-19 DIAGNOSIS — Z6828 Body mass index (BMI) 28.0-28.9, adult: Secondary | ICD-10-CM | POA: Diagnosis not present

## 2022-03-19 DIAGNOSIS — F17200 Nicotine dependence, unspecified, uncomplicated: Secondary | ICD-10-CM | POA: Diagnosis not present

## 2022-03-19 DIAGNOSIS — M5481 Occipital neuralgia: Secondary | ICD-10-CM | POA: Diagnosis not present

## 2022-05-27 DIAGNOSIS — Z7960 Long term (current) use of unspecified immunomodulators and immunosuppressants: Secondary | ICD-10-CM | POA: Diagnosis not present

## 2022-05-27 DIAGNOSIS — L930 Discoid lupus erythematosus: Secondary | ICD-10-CM | POA: Diagnosis not present

## 2022-05-27 DIAGNOSIS — M0579 Rheumatoid arthritis with rheumatoid factor of multiple sites without organ or systems involvement: Secondary | ICD-10-CM | POA: Diagnosis not present

## 2022-09-30 DIAGNOSIS — Z7960 Long term (current) use of unspecified immunomodulators and immunosuppressants: Secondary | ICD-10-CM | POA: Diagnosis not present

## 2022-09-30 DIAGNOSIS — M0579 Rheumatoid arthritis with rheumatoid factor of multiple sites without organ or systems involvement: Secondary | ICD-10-CM | POA: Diagnosis not present

## 2022-09-30 DIAGNOSIS — L930 Discoid lupus erythematosus: Secondary | ICD-10-CM | POA: Diagnosis not present

## 2023-02-08 DIAGNOSIS — Z7960 Long term (current) use of unspecified immunomodulators and immunosuppressants: Secondary | ICD-10-CM | POA: Diagnosis not present

## 2023-02-08 DIAGNOSIS — M0579 Rheumatoid arthritis with rheumatoid factor of multiple sites without organ or systems involvement: Secondary | ICD-10-CM | POA: Diagnosis not present

## 2023-02-08 DIAGNOSIS — L930 Discoid lupus erythematosus: Secondary | ICD-10-CM | POA: Diagnosis not present

## 2023-06-10 DIAGNOSIS — Z7960 Long term (current) use of unspecified immunomodulators and immunosuppressants: Secondary | ICD-10-CM | POA: Diagnosis not present

## 2023-06-10 DIAGNOSIS — L930 Discoid lupus erythematosus: Secondary | ICD-10-CM | POA: Diagnosis not present

## 2023-06-10 DIAGNOSIS — M0579 Rheumatoid arthritis with rheumatoid factor of multiple sites without organ or systems involvement: Secondary | ICD-10-CM | POA: Diagnosis not present
